# Patient Record
Sex: Male | Born: 1960 | Race: White | Hispanic: No | Marital: Married | Smoking: Former smoker
Health system: Southern US, Community
[De-identification: ages and names within clinical notes are randomized; demographics above are authoritative.]

## PROBLEM LIST (undated history)

## (undated) DIAGNOSIS — K760 Fatty (change of) liver, not elsewhere classified: Secondary | ICD-10-CM

## (undated) DIAGNOSIS — J449 Chronic obstructive pulmonary disease, unspecified: Secondary | ICD-10-CM

## (undated) DIAGNOSIS — IMO0001 Reserved for inherently not codable concepts without codable children: Secondary | ICD-10-CM

## (undated) DIAGNOSIS — E7800 Pure hypercholesterolemia, unspecified: Secondary | ICD-10-CM

## (undated) DIAGNOSIS — C3492 Malignant neoplasm of unspecified part of left bronchus or lung: Principal | ICD-10-CM

## (undated) DIAGNOSIS — G4730 Sleep apnea, unspecified: Secondary | ICD-10-CM

## (undated) DIAGNOSIS — IMO0002 Reserved for concepts with insufficient information to code with codable children: Secondary | ICD-10-CM

## (undated) DIAGNOSIS — F419 Anxiety disorder, unspecified: Secondary | ICD-10-CM

## (undated) DIAGNOSIS — E041 Nontoxic single thyroid nodule: Secondary | ICD-10-CM

## (undated) DIAGNOSIS — J439 Emphysema, unspecified: Secondary | ICD-10-CM

## (undated) DIAGNOSIS — F4310 Post-traumatic stress disorder, unspecified: Secondary | ICD-10-CM

## (undated) HISTORY — DX: Nontoxic single thyroid nodule: E04.1

## (undated) HISTORY — DX: Malignant neoplasm of unspecified part of left bronchus or lung: C34.92

## (undated) HISTORY — DX: Reserved for concepts with insufficient information to code with codable children: IMO0002

## (undated) HISTORY — DX: Pure hypercholesterolemia, unspecified: E78.00

## (undated) HISTORY — DX: Fatty (change of) liver, not elsewhere classified: K76.0

## (undated) HISTORY — PX: MOUTH SURGERY: SHX715

## (undated) HISTORY — DX: Emphysema, unspecified: J43.9

## (undated) HISTORY — DX: Reserved for inherently not codable concepts without codable children: IMO0001

---

## 2000-03-30 ENCOUNTER — Inpatient Hospital Stay (HOSPITAL_COMMUNITY): Admission: EM | Admit: 2000-03-30 | Discharge: 2000-04-14 | Payer: Self-pay

## 2000-03-30 ENCOUNTER — Encounter: Payer: Self-pay | Admitting: Emergency Medicine

## 2000-03-30 ENCOUNTER — Encounter: Payer: Self-pay | Admitting: General Surgery

## 2000-03-31 ENCOUNTER — Encounter: Payer: Self-pay | Admitting: General Surgery

## 2000-04-01 ENCOUNTER — Encounter: Payer: Self-pay | Admitting: General Surgery

## 2000-04-02 ENCOUNTER — Encounter: Payer: Self-pay | Admitting: General Surgery

## 2000-04-03 ENCOUNTER — Encounter: Payer: Self-pay | Admitting: General Surgery

## 2000-04-04 ENCOUNTER — Encounter: Payer: Self-pay | Admitting: General Surgery

## 2000-04-04 ENCOUNTER — Encounter: Payer: Self-pay | Admitting: Critical Care Medicine

## 2000-04-05 ENCOUNTER — Encounter: Payer: Self-pay | Admitting: General Surgery

## 2000-04-06 ENCOUNTER — Encounter: Payer: Self-pay | Admitting: General Surgery

## 2000-04-07 ENCOUNTER — Encounter: Payer: Self-pay | Admitting: General Surgery

## 2000-04-08 ENCOUNTER — Encounter: Payer: Self-pay | Admitting: General Surgery

## 2000-04-09 ENCOUNTER — Encounter: Payer: Self-pay | Admitting: General Surgery

## 2000-04-11 ENCOUNTER — Encounter: Payer: Self-pay | Admitting: General Surgery

## 2000-04-13 ENCOUNTER — Encounter: Payer: Self-pay | Admitting: General Surgery

## 2000-04-14 ENCOUNTER — Encounter: Payer: Self-pay | Admitting: General Surgery

## 2000-04-17 ENCOUNTER — Encounter: Admission: RE | Admit: 2000-04-17 | Discharge: 2000-04-17 | Payer: Self-pay | Admitting: General Surgery

## 2005-01-05 ENCOUNTER — Ambulatory Visit: Payer: Self-pay | Admitting: Pulmonary Disease

## 2006-04-17 ENCOUNTER — Ambulatory Visit: Payer: Self-pay | Admitting: Pulmonary Disease

## 2006-04-17 ENCOUNTER — Ambulatory Visit (HOSPITAL_COMMUNITY): Admission: RE | Admit: 2006-04-17 | Discharge: 2006-04-17 | Payer: Self-pay | Admitting: Pulmonary Disease

## 2006-04-17 LAB — CONVERTED CEMR LAB
Alkaline Phosphatase: 69 units/L (ref 39–117)
BUN: 11 mg/dL (ref 6–23)
Basophils Absolute: 0 10*3/uL (ref 0.0–0.1)
Bilirubin, Direct: 0.1 mg/dL (ref 0.0–0.3)
CO2: 31 meq/L (ref 19–32)
Chloride: 102 meq/L (ref 96–112)
Creatinine, Ser: 0.8 mg/dL (ref 0.4–1.5)
Eosinophils Absolute: 0.4 10*3/uL (ref 0.0–0.6)
Eosinophils Relative: 3.6 % (ref 0.0–5.0)
GFR calc non Af Amer: 111 mL/min
Glucose, Bld: 97 mg/dL (ref 70–99)
MCV: 89.8 fL (ref 78.0–100.0)
Monocytes Absolute: 1.1 10*3/uL — ABNORMAL HIGH (ref 0.2–0.7)
Neutro Abs: 5.8 10*3/uL (ref 1.4–7.7)
Platelets: 446 10*3/uL — ABNORMAL HIGH (ref 150–400)
RDW: 13.2 % (ref 11.5–14.6)
Sodium: 140 meq/L (ref 135–145)
Total Bilirubin: 0.6 mg/dL (ref 0.3–1.2)
Total Protein: 8.1 g/dL (ref 6.0–8.3)

## 2006-07-28 ENCOUNTER — Emergency Department (HOSPITAL_COMMUNITY): Admission: EM | Admit: 2006-07-28 | Discharge: 2006-07-29 | Payer: Self-pay | Admitting: Emergency Medicine

## 2006-07-30 ENCOUNTER — Ambulatory Visit: Payer: Self-pay | Admitting: Pulmonary Disease

## 2007-01-02 ENCOUNTER — Ambulatory Visit: Payer: Self-pay | Admitting: Pulmonary Disease

## 2007-01-02 LAB — CONVERTED CEMR LAB
Albumin: 4.1 g/dL (ref 3.5–5.2)
BUN: 9 mg/dL (ref 6–23)
Basophils Absolute: 0.1 10*3/uL (ref 0.0–0.1)
Calcium: 9.7 mg/dL (ref 8.4–10.5)
Chloride: 102 meq/L (ref 96–112)
Eosinophils Absolute: 0.3 10*3/uL (ref 0.0–0.6)
Glucose, Bld: 102 mg/dL — ABNORMAL HIGH (ref 70–99)
HCT: 41.1 % (ref 39.0–52.0)
MCHC: 34.5 g/dL (ref 30.0–36.0)
Monocytes Absolute: 0.8 10*3/uL — ABNORMAL HIGH (ref 0.2–0.7)
Potassium: 4 meq/L (ref 3.5–5.1)
Sed Rate: 35 mm/hr — ABNORMAL HIGH (ref 0–20)
Sodium: 142 meq/L (ref 135–145)
TSH: 3.56 microintl units/mL (ref 0.35–5.50)
Total Protein: 7.8 g/dL (ref 6.0–8.3)
WBC: 12.8 10*3/uL — ABNORMAL HIGH (ref 4.5–10.5)

## 2007-01-03 ENCOUNTER — Ambulatory Visit (HOSPITAL_COMMUNITY): Admission: RE | Admit: 2007-01-03 | Discharge: 2007-01-03 | Payer: Self-pay | Admitting: Pulmonary Disease

## 2007-01-11 ENCOUNTER — Telehealth: Payer: Self-pay | Admitting: Pulmonary Disease

## 2007-06-06 ENCOUNTER — Emergency Department (HOSPITAL_COMMUNITY): Admission: EM | Admit: 2007-06-06 | Discharge: 2007-06-06 | Payer: Self-pay | Admitting: Family Medicine

## 2007-06-12 ENCOUNTER — Emergency Department (HOSPITAL_COMMUNITY): Admission: EM | Admit: 2007-06-12 | Discharge: 2007-06-12 | Payer: Self-pay | Admitting: Emergency Medicine

## 2009-03-06 ENCOUNTER — Emergency Department (HOSPITAL_COMMUNITY): Admission: EM | Admit: 2009-03-06 | Discharge: 2009-03-06 | Payer: Self-pay | Admitting: Family Medicine

## 2009-05-20 ENCOUNTER — Emergency Department (HOSPITAL_COMMUNITY): Admission: EM | Admit: 2009-05-20 | Discharge: 2009-05-20 | Payer: Self-pay | Admitting: Family Medicine

## 2009-08-23 ENCOUNTER — Emergency Department (HOSPITAL_COMMUNITY): Admission: EM | Admit: 2009-08-23 | Discharge: 2009-08-23 | Payer: Self-pay | Admitting: Emergency Medicine
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
## 2010-10-03 ENCOUNTER — Telehealth: Payer: Self-pay | Admitting: Pulmonary Disease

## 2010-10-03 NOTE — Telephone Encounter (Signed)
Pt was last seen in 2008. Please advise Dr. Kriste Basque if you are accepting new patients. Thanks  Carver Fila, CMA

## 2010-10-04 NOTE — Telephone Encounter (Signed)
Spouse called back again- wants pt to be seen asap by dr nadel re: lump on left side of neck- also wants help to stop smoking. Call [phone]. Hazel Sams

## 2010-10-04 NOTE — Telephone Encounter (Signed)
Spoke with pt's wife.  States pt has a swollen area on the right side of his neck near his shoulder.  She is requesting SN see pt for this and to discuss smoking cessation options.  I explained to wife pt has not been seen in > than 3 yrs so he is considered a new pt.  We will have to send the message to Dr. Kriste Basque to see if he is willing to see pt bc he is not accepting new pts at this time.  She is ok with and states pt has not been in prior to this bc he is just know getting insurance. She is requesting SN refer another PCP IF he is unable to see pt.  Dr. Kriste Basque, pls advise if you are ok with seeing this pt.  Thanks!

## 2010-10-05 NOTE — Telephone Encounter (Signed)
lmtcb

## 2010-10-05 NOTE — Telephone Encounter (Signed)
Per SN---ok to see pt again.   Just schedule next aval. thanks

## 2010-10-06 NOTE — Telephone Encounter (Signed)
Aware of appt already.

## 2010-10-14 ENCOUNTER — Ambulatory Visit (INDEPENDENT_AMBULATORY_CARE_PROVIDER_SITE_OTHER): Payer: 59 | Admitting: Pulmonary Disease

## 2010-10-14 ENCOUNTER — Encounter: Payer: Self-pay | Admitting: Pulmonary Disease

## 2010-10-14 ENCOUNTER — Ambulatory Visit (INDEPENDENT_AMBULATORY_CARE_PROVIDER_SITE_OTHER)
Admission: RE | Admit: 2010-10-14 | Discharge: 2010-10-14 | Disposition: A | Discharge status: Home / Self Care | Payer: 59 | Source: Ambulatory Visit | Attending: Pulmonary Disease | Admitting: Pulmonary Disease

## 2010-10-14 ENCOUNTER — Other Ambulatory Visit (INDEPENDENT_AMBULATORY_CARE_PROVIDER_SITE_OTHER): Payer: 59

## 2010-10-14 VITALS — BP 128/82 | HR 77 | Temp 98.4°F | Ht 68.0 in | Wt 229.0 lb

## 2010-10-14 DIAGNOSIS — K7689 Other specified diseases of liver: Secondary | ICD-10-CM

## 2010-10-14 DIAGNOSIS — J449 Chronic obstructive pulmonary disease, unspecified: Secondary | ICD-10-CM

## 2010-10-14 DIAGNOSIS — F17210 Nicotine dependence, cigarettes, uncomplicated: Secondary | ICD-10-CM

## 2010-10-14 DIAGNOSIS — E669 Obesity, unspecified: Secondary | ICD-10-CM

## 2010-10-14 DIAGNOSIS — J4489 Other specified chronic obstructive pulmonary disease: Secondary | ICD-10-CM

## 2010-10-14 DIAGNOSIS — K760 Fatty (change of) liver, not elsewhere classified: Secondary | ICD-10-CM

## 2010-10-14 DIAGNOSIS — M1990 Unspecified osteoarthritis, unspecified site: Secondary | ICD-10-CM

## 2010-10-14 DIAGNOSIS — F17200 Nicotine dependence, unspecified, uncomplicated: Secondary | ICD-10-CM

## 2010-10-14 DIAGNOSIS — E785 Hyperlipidemia, unspecified: Secondary | ICD-10-CM

## 2010-10-14 LAB — BASIC METABOLIC PANEL
Calcium: 9 mg/dL (ref 8.4–10.5)
Chloride: 101 mEq/L (ref 96–112)
GFR: 97.32 mL/min (ref >60.00)
Potassium: 3.8 mEq/L (ref 3.5–5.1)
Sodium: 140 mEq/L (ref 135–145)

## 2010-10-14 LAB — CBC WITH DIFFERENTIAL/PLATELET
Basophils Relative: 1.4 % (ref 0.0–3.0)
Eosinophils Relative: 2.3 % (ref 0.0–5.0)
HCT: 44.9 % (ref 39.0–52.0)
Hemoglobin: 15.3 g/dL (ref 13.0–17.0)
Monocytes Absolute: 1.1 10*3/uL — ABNORMAL HIGH (ref 0.1–1.0)
Monocytes Relative: 11.4 % (ref 3.0–12.0)
RBC: 4.87 Mil/uL (ref 4.22–5.81)
RDW: 14.6 % (ref 11.5–14.6)
WBC: 10 10*3/uL (ref 4.5–10.5)

## 2010-10-14 LAB — HEPATIC FUNCTION PANEL
ALT: 35 U/L (ref 0–53)
Albumin: 4.4 g/dL (ref 3.5–5.2)
Total Protein: 7.9 g/dL (ref 6.0–8.3)

## 2010-10-14 MED ORDER — PREDNISONE 20 MG PO TABS: ORAL_TABLET | ORAL | Status: DC

## 2010-10-14 MED ORDER — TIOTROPIUM BROMIDE MONOHYDRATE 18 MCG IN CAPS: 18.0000 ug | ORAL_CAPSULE | Freq: Every day | RESPIRATORY_TRACT | Status: DC

## 2010-10-14 MED ORDER — BUDESONIDE-FORMOTEROL FUMARATE 160-4.5 MCG/ACT IN AERO: 2.0000 | INHALATION_SPRAY | Freq: Two times a day (BID) | RESPIRATORY_TRACT | Status: DC

## 2010-10-14 NOTE — Patient Instructions (Signed)
We decided to start treatment for your COPD with:    1) Prednisone> take one tab in Am for 10d, then decrease to 1/2 tab daily thereafter...    2) SYMBICORT> 2 inhalations twice daily as demonstrated; take this regularly...    3) SPIRIVA> inhale the contents of one capsule via the handihaler daily.Marland KitchenMarland Kitchen    4) MUCINEX OTC> 600mg  tabs, take 2 tabs twice daily w/ lots of fluids...  We also wrote a prescription for CHANTIX to aide in smoking cessation... Call the quit line at 1-800 QUIT NOW for extra help...  Today we did your follow upCXR & blood work...    Please call the PHONE TREE in a few days for your results...    Dial [id number] & when prompted enter your patient number followed by the # symbol...    Your patient number is:  [id number]#  Let's plan a follow up visit in about a month...    Call for any problems.Marland KitchenMarland Kitchen

## 2010-10-16 ENCOUNTER — Encounter: Payer: Self-pay | Admitting: Pulmonary Disease
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
## 2010-11-15 ENCOUNTER — Ambulatory Visit (INDEPENDENT_AMBULATORY_CARE_PROVIDER_SITE_OTHER): Payer: 59 | Admitting: Pulmonary Disease

## 2010-11-15 ENCOUNTER — Encounter: Payer: Self-pay | Admitting: Pulmonary Disease

## 2010-11-15 VITALS — BP 120/86 | HR 96 | Temp 97.0°F | Ht 68.0 in | Wt 247.2 lb

## 2010-11-15 DIAGNOSIS — M1990 Unspecified osteoarthritis, unspecified site: Secondary | ICD-10-CM

## 2010-11-15 DIAGNOSIS — F17200 Nicotine dependence, unspecified, uncomplicated: Secondary | ICD-10-CM

## 2010-11-15 DIAGNOSIS — K7689 Other specified diseases of liver: Secondary | ICD-10-CM

## 2010-11-15 DIAGNOSIS — K760 Fatty (change of) liver, not elsewhere classified: Secondary | ICD-10-CM

## 2010-11-15 DIAGNOSIS — F17210 Nicotine dependence, cigarettes, uncomplicated: Secondary | ICD-10-CM

## 2010-11-15 DIAGNOSIS — J449 Chronic obstructive pulmonary disease, unspecified: Secondary | ICD-10-CM

## 2010-11-15 DIAGNOSIS — E785 Hyperlipidemia, unspecified: Secondary | ICD-10-CM

## 2010-11-15 DIAGNOSIS — E669 Obesity, unspecified: Secondary | ICD-10-CM

## 2010-11-15 DIAGNOSIS — J4489 Other specified chronic obstructive pulmonary disease: Secondary | ICD-10-CM

## 2010-11-15 MED ORDER — FLUTICASONE-SALMETEROL 230-21 MCG/ACT IN AERO: 2.0000 | INHALATION_SPRAY | Freq: Two times a day (BID) | RESPIRATORY_TRACT | Status: DC

## 2010-11-15 NOTE — Patient Instructions (Signed)
Today we updated your med list in EPIC...    Decrease the Prednisone to 1/2 tab every other day til gone...    Remember to get a copy of your insurance company's Drug Formulary & keep this w/ you at all your doctor visits...    We will try to switch the Symbicort to ADVAIR HFA (to see if this is covered better for you)> 2 sprays twice daily...    Continue the SPIRIVA... Continue the [facility] & Fluids...  Congrats on the smoking cessation!!!  Watch your caloric intake (ie- get on a diet & exercise program) & work on weight reduction...  Let's plan a follow up visit in 3 months.Marland KitchenMarland Kitchen

## 2010-11-20 ENCOUNTER — Encounter: Payer: Self-pay | Admitting: Pulmonary Disease
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
## 2010-12-08 LAB — DIFFERENTIAL
Basophils Absolute: 0.3 — ABNORMAL HIGH
Basophils Relative: 3 — ABNORMAL HIGH
Eosinophils Absolute: 0.3
Lymphs Abs: 2.6

## 2010-12-08 LAB — COMPREHENSIVE METABOLIC PANEL
ALT: 28
AST: 23
Albumin: 3.6
Calcium: 9.6
Glucose, Bld: 109 — ABNORMAL HIGH
Potassium: 3.9
Total Bilirubin: 0.4

## 2010-12-08 LAB — RAPID URINE DRUG SCREEN, HOSP PERFORMED
Barbiturates: NOT DETECTED
Benzodiazepines: POSITIVE — AB
Cocaine: NOT DETECTED
Opiates: NOT DETECTED
Tetrahydrocannabinol: NOT DETECTED

## 2010-12-08 LAB — URINALYSIS, ROUTINE W REFLEX MICROSCOPIC
Bilirubin Urine: NEGATIVE
Glucose, UA: NEGATIVE
Ketones, ur: NEGATIVE
Nitrite: NEGATIVE
Urobilinogen, UA: 0.2
pH: 6.5

## 2010-12-08 LAB — CBC
Hemoglobin: 14.1
MCHC: 35.1
MCV: 90.6
Platelets: 422 — ABNORMAL HIGH
RDW: 13.5

## 2010-12-26 ENCOUNTER — Encounter: Payer: Self-pay | Admitting: Adult Health

## 2010-12-26 ENCOUNTER — Ambulatory Visit (INDEPENDENT_AMBULATORY_CARE_PROVIDER_SITE_OTHER): Payer: 59 | Admitting: Adult Health

## 2010-12-26 VITALS — BP 120/78 | HR 97 | Temp 97.4°F | Ht 68.0 in | Wt 251.2 lb

## 2010-12-26 DIAGNOSIS — R059 Cough, unspecified: Secondary | ICD-10-CM

## 2010-12-26 DIAGNOSIS — R05 Cough: Secondary | ICD-10-CM

## 2010-12-26 MED ORDER — AMOXICILLIN-POT CLAVULANATE 875-125 MG PO TABS: 1.0000 | ORAL_TABLET | Freq: Two times a day (BID) | ORAL | Status: AC

## 2010-12-26 MED ORDER — ALBUTEROL SULFATE (2.5 MG/3ML) 0.083% IN NEBU
2.5000 mg | INHALATION_SOLUTION | Freq: Once | RESPIRATORY_TRACT | Status: AC
  Administered 2010-12-26: 2.5 mg via RESPIRATORY_TRACT

## 2010-12-26 MED ORDER — PREDNISONE 10 MG PO TABS: ORAL_TABLET | ORAL | Status: AC
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
## 2010-12-26 NOTE — Assessment & Plan Note (Signed)
Flare   Plan;  Augmentin 875mg  Twice daily  For 7 days with food  Mucinex DM Twice daily  As needed  Cough/congestion  Prednisone taper over next week.  Fluids and rest  Please contact office for sooner follow up if symptoms do not improve or worsen or seek emergency care

## 2010-12-26 NOTE — Patient Instructions (Signed)
Augmentin 875mg  Twice daily  For 7 days with food  Mucinex DM Twice daily  As needed  Cough/congestion  Prednisone taper over next week.  Fluids and rest  Please contact office for sooner follow up if symptoms do not improve or worsen or seek emergency care

## 2011-02-12 ENCOUNTER — Emergency Department (HOSPITAL_COMMUNITY): Payer: 59

## 2011-02-12 ENCOUNTER — Emergency Department (HOSPITAL_COMMUNITY)
Admission: EM | Admit: 2011-02-12 | Discharge: 2011-02-12 | Disposition: A | Discharge status: Home / Self Care | Payer: 59 | Attending: Emergency Medicine | Admitting: Emergency Medicine

## 2011-02-12 ENCOUNTER — Encounter (HOSPITAL_COMMUNITY): Payer: Self-pay | Admitting: Emergency Medicine

## 2011-02-12 DIAGNOSIS — R079 Chest pain, unspecified: Secondary | ICD-10-CM | POA: Insufficient documentation

## 2011-02-12 DIAGNOSIS — R404 Transient alteration of awareness: Secondary | ICD-10-CM | POA: Insufficient documentation

## 2011-02-12 DIAGNOSIS — R141 Gas pain: Secondary | ICD-10-CM | POA: Insufficient documentation

## 2011-02-12 DIAGNOSIS — S60219A Contusion of unspecified wrist, initial encounter: Secondary | ICD-10-CM | POA: Insufficient documentation

## 2011-02-12 DIAGNOSIS — M7989 Other specified soft tissue disorders: Secondary | ICD-10-CM | POA: Insufficient documentation

## 2011-02-12 DIAGNOSIS — R142 Eructation: Secondary | ICD-10-CM | POA: Insufficient documentation

## 2011-02-12 DIAGNOSIS — Y92410 Unspecified street and highway as the place of occurrence of the external cause: Secondary | ICD-10-CM | POA: Insufficient documentation

## 2011-02-12 DIAGNOSIS — R109 Unspecified abdominal pain: Secondary | ICD-10-CM | POA: Insufficient documentation

## 2011-02-12 DIAGNOSIS — R10819 Abdominal tenderness, unspecified site: Secondary | ICD-10-CM | POA: Insufficient documentation

## 2011-02-12 DIAGNOSIS — M79609 Pain in unspecified limb: Secondary | ICD-10-CM | POA: Insufficient documentation

## 2011-02-12 DIAGNOSIS — R51 Headache: Secondary | ICD-10-CM | POA: Insufficient documentation

## 2011-02-12 DIAGNOSIS — IMO0002 Reserved for concepts with insufficient information to code with codable children: Secondary | ICD-10-CM | POA: Insufficient documentation

## 2011-02-12 DIAGNOSIS — R143 Flatulence: Secondary | ICD-10-CM | POA: Insufficient documentation

## 2011-02-12 HISTORY — DX: Chronic obstructive pulmonary disease, unspecified: J44.9

## 2011-02-12 LAB — BASIC METABOLIC PANEL
Chloride: 98 mEq/L (ref 96–112)
Creatinine, Ser: 0.85 mg/dL (ref 0.50–1.35)
GFR calc Af Amer: >90 mL/min (ref >90)
GFR calc non Af Amer: >90 mL/min (ref >90)
Potassium: 4.2 mEq/L (ref 3.5–5.1)

## 2011-02-12 LAB — CBC
MCHC: 33.6 g/dL (ref 30.0–36.0)
Platelets: 372 10*3/uL (ref 150–400)
RDW: 14.3 % (ref 11.5–15.5)
WBC: 11.8 10*3/uL — ABNORMAL HIGH (ref 4.0–10.5)

## 2011-02-12 LAB — PROTIME-INR: Prothrombin Time: 11.6 seconds (ref 11.6–15.2)

## 2011-02-12 MED ORDER — IOHEXOL 300 MG/ML  SOLN
100.0000 mL | Freq: Once | INTRAMUSCULAR | Status: AC | PRN
  Administered 2011-02-12: 100 mL via INTRAVENOUS

## 2011-02-12 MED ORDER — HYDROCODONE-ACETAMINOPHEN 5-500 MG PO TABS: 1.0000 | ORAL_TABLET | Freq: Four times a day (QID) | ORAL | Status: AC | PRN

## 2011-02-12 MED ORDER — HYDROMORPHONE HCL PF 1 MG/ML IJ SOLN
1.0000 mg | Freq: Once | INTRAMUSCULAR | Status: AC
  Administered 2011-02-12: 1 mg via INTRAVENOUS
  Filled 2011-02-12: qty 1

## 2011-02-12 NOTE — ED Notes (Signed)
Pt is in xray at this time 
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
## 2011-02-20 ENCOUNTER — Ambulatory Visit: Payer: 59 | Admitting: Pulmonary Disease

## 2011-02-24 ENCOUNTER — Other Ambulatory Visit: Payer: Self-pay | Admitting: Pulmonary Disease

## 2011-02-24 ENCOUNTER — Other Ambulatory Visit (INDEPENDENT_AMBULATORY_CARE_PROVIDER_SITE_OTHER): Payer: 59

## 2011-02-24 ENCOUNTER — Ambulatory Visit (INDEPENDENT_AMBULATORY_CARE_PROVIDER_SITE_OTHER): Payer: 59 | Admitting: Pulmonary Disease

## 2011-02-24 ENCOUNTER — Encounter: Payer: Self-pay | Admitting: Pulmonary Disease

## 2011-02-24 DIAGNOSIS — G4733 Obstructive sleep apnea (adult) (pediatric): Secondary | ICD-10-CM

## 2011-02-24 DIAGNOSIS — M1990 Unspecified osteoarthritis, unspecified site: Secondary | ICD-10-CM

## 2011-02-24 DIAGNOSIS — K760 Fatty (change of) liver, not elsewhere classified: Secondary | ICD-10-CM

## 2011-02-24 DIAGNOSIS — K7689 Other specified diseases of liver: Secondary | ICD-10-CM

## 2011-02-24 DIAGNOSIS — E785 Hyperlipidemia, unspecified: Secondary | ICD-10-CM

## 2011-02-24 DIAGNOSIS — F17210 Nicotine dependence, cigarettes, uncomplicated: Secondary | ICD-10-CM

## 2011-02-24 DIAGNOSIS — E669 Obesity, unspecified: Secondary | ICD-10-CM

## 2011-02-24 DIAGNOSIS — F17200 Nicotine dependence, unspecified, uncomplicated: Secondary | ICD-10-CM

## 2011-02-24 DIAGNOSIS — J449 Chronic obstructive pulmonary disease, unspecified: Secondary | ICD-10-CM

## 2011-02-24 DIAGNOSIS — Z23 Encounter for immunization: Secondary | ICD-10-CM

## 2011-02-24 DIAGNOSIS — J4489 Other specified chronic obstructive pulmonary disease: Secondary | ICD-10-CM

## 2011-02-24 HISTORY — DX: Obstructive sleep apnea (adult) (pediatric): G47.33

## 2011-02-24 LAB — HEPATIC FUNCTION PANEL
ALT: 36 U/L (ref 0–53)
AST: 27 U/L (ref 0–37)
Alkaline Phosphatase: 74 U/L (ref 39–117)
Total Bilirubin: 0.5 mg/dL (ref 0.3–1.2)

## 2011-02-24 LAB — LIPID PANEL
Cholesterol: 203 mg/dL — ABNORMAL HIGH (ref 0–200)
Triglycerides: 277 mg/dL — ABNORMAL HIGH (ref 0.0–149.0)

## 2011-02-24 LAB — LDL CHOLESTEROL, DIRECT: Direct LDL: 125.3 mg/dL

## 2011-02-24 MED ORDER — BUDESONIDE-FORMOTEROL FUMARATE 160-4.5 MCG/ACT IN AERO: 2.0000 | INHALATION_SPRAY | Freq: Two times a day (BID) | RESPIRATORY_TRACT | Status: DC

## 2011-02-24 MED ORDER — TIOTROPIUM BROMIDE MONOHYDRATE 18 MCG IN CAPS: 18.0000 ug | ORAL_CAPSULE | Freq: Every day | RESPIRATORY_TRACT | Status: DC
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
## 2011-02-25 ENCOUNTER — Encounter: Payer: Self-pay | Admitting: Pulmonary Disease

## 2011-03-08 ENCOUNTER — Ambulatory Visit (HOSPITAL_BASED_OUTPATIENT_CLINIC_OR_DEPARTMENT_OTHER): Payer: 59 | Attending: Pulmonary Disease

## 2011-03-08 VITALS — Ht 68.0 in | Wt 252.0 lb

## 2011-03-08 DIAGNOSIS — Z9989 Dependence on other enabling machines and devices: Secondary | ICD-10-CM

## 2011-03-08 DIAGNOSIS — G4733 Obstructive sleep apnea (adult) (pediatric): Secondary | ICD-10-CM

## 2011-03-21 DIAGNOSIS — G4733 Obstructive sleep apnea (adult) (pediatric): Secondary | ICD-10-CM

## 2011-03-22 NOTE — Procedures (Signed)
NAMETALHA, ISER NO.:  [id number]  MEDICAL RECORD NO.:  [id number]          PATIENT TYPE:  OUT  LOCATION:  SLEEP CENTER                 FACILITY[facility]  [facility]  PHYSICIAN:  Barbaraann Share, MD,FCCPDATE OF BIRTH:  10/23/1960  DATE OF STUDY:  03/08/2011                           NOCTURNAL POLYSOMNOGRAM  REFERRING PHYSICIAN:  Lonzo Cloud. Kriste Basque, MD  INDICATION FOR STUDY:  Hypersomnia with sleep apnea.  EPWORTH SLEEPINESS SCORE:  13.  MEDICATIONS:  SLEEP ARCHITECTURE:  The patient had a total sleep time of 347 minutes with very little slow-wave sleep and adequate quantity of REM.  Sleep onset latency was normal at 13 minutes and REM onset was prolonged at 134 minutes.  Sleep efficiency was 72% during the diagnostic portion of the study and 94% during the titration portion of the study.  RESPIRATORY DATA:  The patient underwent standard NPSG for possible obstructive sleep apnea.  However, during the study, he met emergency split night criteria due to severe oxygen desaturation.  He was found to have 202 obstructive events in the first 132 minutes of sleep.  This gave him an apnea-hypopnea index of 92 events per hour during the diagnostic portion of the study.  The events occurred in all body positions and there was loud snoring noted throughout.  By protocol, the patient was fitted with a medium ResMed Quattro FX full face mask, and CPAP titration was initiated.  At a CPAP pressure of 13 cm, he was found to have good control of his obstructive events and snoring; however, he continued to have oxygen desaturation.  OXYGEN DATA:  There was O2 desaturation as low as 56% with the patient's obstructive events.  CARDIAC DATA:  No clinically significant arrhythmias were noted.  MOVEMENT-PARASOMNIA:  The patient had no significant leg jerks or other abnormalities noted.  IMPRESSIONS-RECOMMENDATIONS:  Very severe obstructive sleep apnea/hypopnea syndrome with an AHI of 92  events per hour during the diagnostic portion of the study, and O2 desaturation as low as 56%.  The patient was then fitted with a medium ResMed Quattro FX full face mask, and titrated to an optimal pressure of 13 cm.  The patient will need to be started on CPAP, and will need overnight oximetry on his therapeutic CPAP pressure in order to determine further supplemental oxygen needs.  He should also be encouraged to work aggressively on weight loss.     Barbaraann Share, MD,FCCP Diplomate, American Board of Sleep Medicine Electronically Signed    [facility]  D:  03/22/2011 08:10:39  T:  03/22/2011 08:38:30  Job:  [id number]

## 2011-03-28 ENCOUNTER — Ambulatory Visit (INDEPENDENT_AMBULATORY_CARE_PROVIDER_SITE_OTHER): Payer: 59 | Admitting: Pulmonary Disease

## 2011-03-28 ENCOUNTER — Encounter: Payer: Self-pay | Admitting: Pulmonary Disease

## 2011-03-28 VITALS — BP 110/80 | HR 86 | Temp 98.2°F | Ht 68.0 in | Wt 242.2 lb

## 2011-03-28 DIAGNOSIS — G4733 Obstructive sleep apnea (adult) (pediatric): Secondary | ICD-10-CM

## 2011-03-28 NOTE — Progress Notes (Signed)
Subjective:    Patient ID: Vincent Sutton, male    DOB: November 30, 1960, 51 y.o.   MRN: [id number]  HPI The patient is a 51 year old male who I've been asked to see for management of obstructive sleep apnea.  He recently underwent a sleep study, where he was found to have very severe sleep apnea.  The patient had an AHI of 92 events per hour, with very severe oxygen desaturation.  He was started on CPAP, and titrated up to an optimal pressure of 13-14 cm of water.  The patient is morbidly obese, and has been noted to have loud snoring.  His wife has also commented on an abnormal breathing pattern during sleep, and has videotaped him.  The patient has frequent awakenings at night, and is not rested in the mornings upon arising.  He notes definite sleep pressure with periods of inactivity, and will fall asleep easily watching television in the evening.  He also has noticed sleep pressure with driving at times.  The patient states that his weight is up 25 pounds over the last 2 years, and his Epworth sleepiness score was elevated at the time of his sleep study.  Sleep Questionnaire: What time do you typically go to bed?( Between what hours) 10pm - 5am How long does it take you to fall asleep? 10 mins How many times during the night do you wake up? What time do you get out of bed to start your day? 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 (In pounds) 25 lb (11.34 kg) Have you ever had a sleep study before? Yes If yes, location of study? [facility] If yes, date of study? 03/08/2011 Do you currently use CPAP? No Do you wear oxygen at any time? No O2 Flow Rate (L/min) 2 L/min    Review of Systems  Constitutional: Negative for fever and unexpected weight change.  HENT: Negative for ear pain, nosebleeds, congestion, sore throat, rhinorrhea, sneezing, trouble swallowing, dental problem, postnasal drip and sinus pressure.     Eyes: Negative for redness and itching.  Respiratory: Negative for cough, chest tightness, shortness of breath and wheezing.   Cardiovascular: Negative for palpitations and leg swelling.  Gastrointestinal: Negative for nausea and vomiting.  Genitourinary: Negative for dysuria.  Musculoskeletal: Negative for joint swelling.  Skin: Negative for rash.  Neurological: Negative for headaches.  Hematological: Does not bruise/bleed easily.  Psychiatric/Behavioral: Negative for dysphoric mood. The patient is not nervous/anxious.        Objective:   Physical Exam Constitutional:  Morbidly obese, no acute distress  HENT:  Nares patent without discharge, but deviated septum to right with narrowing.  Oropharynx without exudate, palate and uvula are very thick and elongated, very little space posteriorly  Eyes:  Perrla, eomi, no scleral icterus  Neck:  No JVD, no TMG  Cardiovascular:  Normal rate, regular rhythm, no rubs or gallops.  No murmurs        Intact distal pulses  Pulmonary :  Normal breath sounds, no stridor or respiratory distress   No wheezing, but ++rhonchi  Abdominal:  Soft, nondistended, bowel sounds present.  No tenderness noted.   Musculoskeletal:  No lower extremity edema noted.  Lymph Nodes:  No cervical lymphadenopathy noted  Skin:  No cyanosis noted  Neurologic: appears sleepy, appropriate, moves all 4 extremities without obvious deficit.         Assessment & Plan:

## 2011-03-28 NOTE — Assessment & Plan Note (Signed)
The patient has been diagnosed with very severe obstructive sleep apnea by his recent sleep study, and is clearly very symptomatic at night and also during the day.  I have also reviewed its impact to his cardiovascular health.  At this point, he will need to be started on CPAP, and I have encouraged him to work aggressively on weight loss.  The patient is to call me if he has any issues with CPAP tolerance.

## 2011-03-28 NOTE — Patient Instructions (Signed)
Will start on cpap.  Please call if any tolerance issues Work on weight loss followup with me in 5-6 weeks.

## 2011-05-02 ENCOUNTER — Ambulatory Visit (INDEPENDENT_AMBULATORY_CARE_PROVIDER_SITE_OTHER): Payer: 59 | Admitting: Pulmonary Disease

## 2011-05-02 ENCOUNTER — Encounter: Payer: Self-pay | Admitting: Pulmonary Disease

## 2011-05-02 VITALS — BP 132/82 | HR 74 | Temp 97.7°F | Ht 68.0 in | Wt 242.2 lb

## 2011-05-02 DIAGNOSIS — G4733 Obstructive sleep apnea (adult) (pediatric): Secondary | ICD-10-CM

## 2011-05-02 NOTE — Progress Notes (Signed)
  Subjective:    Patient ID: Vincent Sutton, male    DOB: August 20, 1960, 51 y.o.   MRN: [id number]  HPI The patient comes in today for followup of his known obstructive sleep apnea.  He has been started on CPAP, and has been doing very well with the device.  He has seen a very significant improvement in his sleep and daytime alertness, and is having no issues with his mask fit or pressure.  He is not quite to his optimal CPAP level, and we'll have to get this increased.   Review of Systems  Constitutional: Negative for fever and unexpected weight change.  HENT: Positive for congestion, rhinorrhea, sneezing and postnasal drip. Negative for ear pain, nosebleeds, sore throat, trouble swallowing, dental problem and sinus pressure.   Eyes: Negative for redness and itching.  Respiratory: Negative for cough, chest tightness, shortness of breath and wheezing.   Cardiovascular: Negative for palpitations and leg swelling.  Gastrointestinal: Negative for nausea and vomiting.  Genitourinary: Negative for dysuria.  Musculoskeletal: Negative for joint swelling.  Skin: Negative for rash.  Neurological: Negative for headaches.  Hematological: Does not bruise/bleed easily.  Psychiatric/Behavioral: Negative for dysphoric mood. The patient is not nervous/anxious.        Objective:   Physical Exam Overweight male in no acute distress No skin breakdown or pressure necrosis from the CPAP mask Lower extremities without significant edema, no cyanosis Alert and oriented, moves all 4 extremities.       Assessment & Plan:

## 2011-05-02 NOTE — Assessment & Plan Note (Signed)
The patient comes in today for followup after starting on CPAP, and has seen a tremendous difference in his sleep and daytime alertness.  He is very pleased with his response to therapy so far.  At this point, we need to increase his CPAP pressure to his therapeutic level, and I have encouraged him to work aggressively on weight loss.  If he is doing well, I will see him back in 6 months.

## 2011-05-02 NOTE — Patient Instructions (Signed)
Will send an order to your equipment company to increase your cpap pressure to 13cm. Work on [employer] loss Can take zyrtec 10mg  each am for your allergy symptoms during the day. If doing well, followup with me in 6mos.  Call if having issues with cpap.

## 2011-05-15 ENCOUNTER — Telehealth: Payer: Self-pay | Admitting: Pulmonary Disease

## 2011-05-15 DIAGNOSIS — G4733 Obstructive sleep apnea (adult) (pediatric): Secondary | ICD-10-CM

## 2011-05-15 NOTE — Telephone Encounter (Signed)
I have sent order [facility], please advise thanks

## 2011-05-15 NOTE — Telephone Encounter (Signed)
Called and spoke with patient and advised patient that order has been faxed to [facility] for them to provide a full face mask. Pt stated that he wanted a full face the last time [employer] provided him with a mask and pt stated that [employer] didn't have one in stock. Advised patient to call me back if [facility] was not able to provide full face mask. Patient was advised that if [employer] couldn't that we could send him to the sleep lab for a mask fitting.  Referral has been faxed and patient is aware of the above.

## 2011-05-15 NOTE — Telephone Encounter (Signed)
I spoke with pt and he states since his cpap pressure has been turned up he can't keep seal on his mask. Pt states the nasal mask does not work right since he had broke his nose before. Pt states he can't get the mask that goes over his mouth and nose until may. He is requesting further recs from [facility]. Please advise thanks

## 2011-05-15 NOTE — Telephone Encounter (Signed)
Pcc, see if we can help this guy.  If he needs a new mask, he needs a new mask.  Sounds like he needs full face.  See if dme can work with him, and if not, ?refer to sleep center for fitting and give him a mask? thanks

## 2011-05-15 NOTE — Telephone Encounter (Signed)
Please send DME referral for [employer] to provide full face mask.

## 2011-05-26 ENCOUNTER — Ambulatory Visit (INDEPENDENT_AMBULATORY_CARE_PROVIDER_SITE_OTHER): Payer: 59 | Admitting: Pulmonary Disease

## 2011-05-26 ENCOUNTER — Encounter: Payer: Self-pay | Admitting: Pulmonary Disease

## 2011-05-26 VITALS — BP 136/82 | HR 71 | Temp 97.1°F | Ht 68.0 in | Wt 239.2 lb

## 2011-05-26 DIAGNOSIS — F17210 Nicotine dependence, cigarettes, uncomplicated: Secondary | ICD-10-CM

## 2011-05-26 DIAGNOSIS — F17200 Nicotine dependence, unspecified, uncomplicated: Secondary | ICD-10-CM

## 2011-05-26 DIAGNOSIS — K760 Fatty (change of) liver, not elsewhere classified: Secondary | ICD-10-CM

## 2011-05-26 DIAGNOSIS — E785 Hyperlipidemia, unspecified: Secondary | ICD-10-CM

## 2011-05-26 DIAGNOSIS — K7689 Other specified diseases of liver: Secondary | ICD-10-CM

## 2011-05-26 DIAGNOSIS — J4489 Other specified chronic obstructive pulmonary disease: Secondary | ICD-10-CM

## 2011-05-26 DIAGNOSIS — M1990 Unspecified osteoarthritis, unspecified site: Secondary | ICD-10-CM

## 2011-05-26 DIAGNOSIS — E669 Obesity, unspecified: Secondary | ICD-10-CM

## 2011-05-26 DIAGNOSIS — J449 Chronic obstructive pulmonary disease, unspecified: Secondary | ICD-10-CM

## 2011-05-26 DIAGNOSIS — G4733 Obstructive sleep apnea (adult) (pediatric): Secondary | ICD-10-CM

## 2011-05-26 MED ORDER — TADALAFIL 20 MG PO TABS: ORAL_TABLET | ORAL | Status: DC
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
## 2011-05-26 NOTE — Patient Instructions (Signed)
Today we updated your med list in our EPIC system...    Continue your current medications the same...    We wrote a new prescription for the Cialis as we discussed...  Keep up the great job w/ diet, exercise & weight reduction!!!  Congrats on the smoking improvement down to 2 cig/d...    Keep on going until you've quit completely!!!  Call for any problems...  Let's plan a follow up visit in 6 months or so & we will check your FASTING blood work at that time.Marland KitchenMarland Kitchen

## 2011-06-20 ENCOUNTER — Telehealth: Payer: Self-pay | Admitting: Pulmonary Disease

## 2011-06-20 MED ORDER — CICLESONIDE 160 MCG/ACT IN AERS: 2.0000 | INHALATION_SPRAY | Freq: Two times a day (BID) | RESPIRATORY_TRACT | Status: DC

## 2011-06-20 NOTE — Telephone Encounter (Signed)
Per SN---ok to change to alvesco 160  2 puffs bid.  thanks

## 2011-06-20 NOTE — Telephone Encounter (Signed)
New rx has been sent to the pharmacy and pt is aware of the change. Nothing further was needed

## 2011-06-20 NOTE — Telephone Encounter (Signed)
Called pharmacy help desk at [phone]. Was on hold for over 10 minutes. We will need to call for PA later. Member # [id number].  Pharmacy did list covered alternatives and they are Alvesco and Asmanex. Please advise if you would like to change to one of the covered alternatives or have the nurse call for PA on the Symbicort.[name]

## 2011-11-03 ENCOUNTER — Ambulatory Visit (INDEPENDENT_AMBULATORY_CARE_PROVIDER_SITE_OTHER): Payer: 59 | Admitting: Pulmonary Disease

## 2011-11-03 ENCOUNTER — Encounter: Payer: Self-pay | Admitting: Pulmonary Disease

## 2011-11-03 VITALS — BP 132/88 | HR 84 | Temp 98.5°F | Ht 68.0 in | Wt 225.6 lb

## 2011-11-03 DIAGNOSIS — G4733 Obstructive sleep apnea (adult) (pediatric): Secondary | ICD-10-CM

## 2011-11-03 DIAGNOSIS — Z23 Encounter for immunization: Secondary | ICD-10-CM

## 2011-11-03 MED ORDER — MUPIROCIN CALCIUM 2 % EX CREA: TOPICAL_CREAM | Freq: Three times a day (TID) | CUTANEOUS | Status: AC

## 2011-11-03 NOTE — Progress Notes (Signed)
  Subjective:    Patient ID: Vincent Sutton, male    DOB: [birth year], 51 y.o.   MRN: [id number]  HPI The patient in today for followup of his obstructive sleep apnea.  He is having issues with his CPAP mask, and this leads to poor tolerance at times.  Other times, he is able to work consistently, and sees a big difference in how he sleeps and feels that next day.  He has been placed on his optimal pressure, and is having no issues with tolerance.  He has tried to work with his Scientist, research (physical sciences), but he is having no success in getting an adequately fitting mask.   Review of Systems  Constitutional: Negative for fever and unexpected weight change.  HENT: Negative for ear pain, nosebleeds, congestion, sore throat, rhinorrhea, sneezing, trouble swallowing, dental problem, postnasal drip and sinus pressure.   Eyes: Negative for redness and itching.  Respiratory: Negative for cough, chest tightness, shortness of breath and wheezing.   Cardiovascular: Negative for palpitations and leg swelling.  Gastrointestinal: Negative for nausea and vomiting.  Genitourinary: Negative for dysuria.  Musculoskeletal: Negative for joint swelling.  Skin: Negative for rash.  Neurological: Negative for headaches.  Hematological: Does not bruise/bleed easily.  Psychiatric/Behavioral: Negative for dysphoric mood. The patient is not nervous/anxious.        Objective:   Physical Exam Overweight male in no acute distress No skin breakdown or pressure necrosis from the CPAP mask Lower extremities with minimal edema, no cyanosis Alert and oriented, does not appear to be sleepy, moves all 4 extremities.       Assessment & Plan:

## 2011-11-03 NOTE — Patient Instructions (Addendum)
Continue with cpap, and keep up with supplies Will refer you to the sleep center for mask fitting. Keep working on weight loss followup with me in one year if doing well.

## 2011-11-03 NOTE — Assessment & Plan Note (Signed)
The patient is wearing CPAP consistently, but can only wear for a short period of time on some nights because of mask issues.  I have explained to him that mask that is everything, and that he will not be successful with CPAP if we cannot improve this.  I would like to send him to the sleep center for a formal fitting, and he is agreeable.  I also encouraged him to work aggressively on weight loss.

## 2011-11-06 ENCOUNTER — Telehealth: Payer: Self-pay | Admitting: Pulmonary Disease

## 2011-11-06 DIAGNOSIS — G4733 Obstructive sleep apnea (adult) (pediatric): Secondary | ICD-10-CM

## 2011-11-06 NOTE — Telephone Encounter (Signed)
Order was sent to [facility] 

## 2011-11-30 ENCOUNTER — Other Ambulatory Visit: Payer: Self-pay | Admitting: Pulmonary Disease

## 2011-12-01 ENCOUNTER — Encounter: Payer: Self-pay | Admitting: Pulmonary Disease

## 2011-12-01 ENCOUNTER — Ambulatory Visit (INDEPENDENT_AMBULATORY_CARE_PROVIDER_SITE_OTHER): Payer: 59 | Admitting: Pulmonary Disease

## 2011-12-01 VITALS — BP 122/88 | HR 69 | Temp 97.0°F | Ht 68.0 in | Wt 223.2 lb

## 2011-12-01 DIAGNOSIS — E785 Hyperlipidemia, unspecified: Secondary | ICD-10-CM

## 2011-12-01 DIAGNOSIS — K7689 Other specified diseases of liver: Secondary | ICD-10-CM

## 2011-12-01 DIAGNOSIS — F17210 Nicotine dependence, cigarettes, uncomplicated: Secondary | ICD-10-CM

## 2011-12-01 DIAGNOSIS — E669 Obesity, unspecified: Secondary | ICD-10-CM

## 2011-12-01 DIAGNOSIS — F17200 Nicotine dependence, unspecified, uncomplicated: Secondary | ICD-10-CM

## 2011-12-01 DIAGNOSIS — J449 Chronic obstructive pulmonary disease, unspecified: Secondary | ICD-10-CM

## 2011-12-01 DIAGNOSIS — J4489 Other specified chronic obstructive pulmonary disease: Secondary | ICD-10-CM

## 2011-12-01 DIAGNOSIS — K760 Fatty (change of) liver, not elsewhere classified: Secondary | ICD-10-CM

## 2011-12-01 DIAGNOSIS — G4733 Obstructive sleep apnea (adult) (pediatric): Secondary | ICD-10-CM

## 2011-12-01 DIAGNOSIS — M1990 Unspecified osteoarthritis, unspecified site: Secondary | ICD-10-CM

## 2011-12-01 MED ORDER — MOMETASONE FUROATE 220 MCG/INH IN AEPB: 2.0000 | INHALATION_SPRAY | Freq: Every day | RESPIRATORY_TRACT | Status: DC

## 2011-12-01 NOTE — Patient Instructions (Addendum)
Today we updated your med list in our EPIC system...    Continue your current medications the same...  We decided to change the Alvesco to [facility]- 2 inhalations once daily...  Keep up the good work w/ wt reduction & smoking cessation...  Call for any problems...  Let's plan a follow up visit in 6 months w/ CXR & FASTING blood work at that time.Marland KitchenMarland Kitchen
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
## 2012-04-15 ENCOUNTER — Telehealth: Payer: Self-pay | Admitting: Pulmonary Disease

## 2012-04-15 DIAGNOSIS — G4733 Obstructive sleep apnea (adult) (pediatric): Secondary | ICD-10-CM

## 2012-04-15 NOTE — Telephone Encounter (Signed)
Pt is aware. Order has been sent to [facility]. Nothing further was needed

## 2012-04-15 NOTE — Telephone Encounter (Signed)
Spoke with patient-he would like to have CPAP cut back down between 10-12 as now its set up to 15, makes it "blow his face off". Has new mask from DME since last visit as well. Pt aware that KC is back in the office this afternoon.

## 2012-04-15 NOTE — Telephone Encounter (Signed)
Ok to decrease cpap to 12cm.

## 2012-05-31 ENCOUNTER — Ambulatory Visit: Payer: 59 | Admitting: Pulmonary Disease

## 2012-06-29 ENCOUNTER — Other Ambulatory Visit: Payer: Self-pay | Admitting: Pulmonary Disease

## 2012-07-18 ENCOUNTER — Telehealth: Payer: Self-pay | Admitting: Pulmonary Disease

## 2012-07-18 DIAGNOSIS — N486 Induration penis plastica: Secondary | ICD-10-CM

## 2012-07-18 NOTE — Telephone Encounter (Signed)
I spoke with spouse and she stated pt has a tremendous "bend in his penis". Pt can feel a hard spot on top of the penis but spouse can;t feel anything. She states when pt has an erection it curves tremendously and she thinks pt needs to go have this checked out. Pt has noticed this in the past couple weeks. Per spouse pt has not had an injury to it. Please advise SN thanks

## 2012-07-18 NOTE — Telephone Encounter (Signed)
Per SN---  This is peyronies disease.   Will need urology eval.  Called and spoke with pts wife and she is aware of referral that has been placed for the pt.  Nothing further is needed.

## 2012-09-26 ENCOUNTER — Telehealth: Payer: Self-pay | Admitting: Pulmonary Disease

## 2012-09-26 NOTE — Telephone Encounter (Signed)
Called pt to make next ov per recall.  Pt's # has changed since last ov.  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  I mailed recall letter to home address on 09/26/12. Vincent Sutton

## 2012-10-04 ENCOUNTER — Other Ambulatory Visit: Payer: Self-pay | Admitting: Pulmonary Disease

## 2012-10-28 ENCOUNTER — Emergency Department (HOSPITAL_COMMUNITY)
Admission: EM | Admit: 2012-10-28 | Discharge: 2012-10-28 | Disposition: A | Discharge status: Home / Self Care | Payer: BC Managed Care – PPO | Source: Home / Self Care | Attending: Emergency Medicine | Admitting: Emergency Medicine

## 2012-10-28 ENCOUNTER — Emergency Department (INDEPENDENT_AMBULATORY_CARE_PROVIDER_SITE_OTHER): Payer: BC Managed Care – PPO

## 2012-10-28 ENCOUNTER — Encounter (HOSPITAL_COMMUNITY): Payer: Self-pay | Admitting: *Deleted

## 2012-10-28 DIAGNOSIS — J40 Bronchitis, not specified as acute or chronic: Secondary | ICD-10-CM

## 2012-10-28 MED ORDER — ALBUTEROL SULFATE (5 MG/ML) 0.5% IN NEBU
INHALATION_SOLUTION | RESPIRATORY_TRACT | Status: AC
  Filled 2012-10-28: qty 1

## 2012-10-28 MED ORDER — SULFAMETHOXAZOLE-TMP DS 800-160 MG PO TABS: 1.0000 | ORAL_TABLET | Freq: Two times a day (BID) | ORAL | Status: DC

## 2012-10-28 MED ORDER — SODIUM CHLORIDE 0.9 % IJ SOLN
INTRAMUSCULAR | Status: AC
  Filled 2012-10-28: qty 3

## 2012-10-28 MED ORDER — PREDNISONE 10 MG PO TABS: 50.0000 mg | ORAL_TABLET | Freq: Every day | ORAL | Status: DC

## 2012-10-28 MED ORDER — IPRATROPIUM BROMIDE 0.02 % IN SOLN
0.5000 mg | Freq: Once | RESPIRATORY_TRACT | Status: AC
  Administered 2012-10-28: 0.5 mg via RESPIRATORY_TRACT

## 2012-10-28 MED ORDER — ALBUTEROL SULFATE (5 MG/ML) 0.5% IN NEBU
5.0000 mg | INHALATION_SOLUTION | Freq: Once | RESPIRATORY_TRACT | Status: AC
  Administered 2012-10-28: 5 mg via RESPIRATORY_TRACT

## 2012-10-28 MED ORDER — ALBUTEROL SULFATE HFA 108 (90 BASE) MCG/ACT IN AERS: 2.0000 | INHALATION_SPRAY | RESPIRATORY_TRACT | Status: DC | PRN

## 2012-10-28 NOTE — ED Provider Notes (Signed)
Medical screening examination/treatment/procedure(s) were performed by non-physician practitioner and as supervising physician I was immediately available for consultation/collaboration.  Leslee Home, M.D.  Reuben Likes, MD 10/28/12 [phone]

## 2012-10-28 NOTE — ED Notes (Signed)
Pt  Has   Symptoms  Of  Cough  /  Congested          With  Symptoms  Of  Malaise  /    Fatigue  As  Well          Pt  Reports a  sorethroat         As  Well      Pt    Is  Smoker    With a  History  Of   Asthma            He  Takes  spiriva
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
## 2012-11-08 ENCOUNTER — Ambulatory Visit: Payer: BC Managed Care – PPO | Admitting: Pulmonary Disease

## 2012-12-10 ENCOUNTER — Ambulatory Visit (INDEPENDENT_AMBULATORY_CARE_PROVIDER_SITE_OTHER): Payer: BC Managed Care – PPO | Admitting: Pulmonary Disease

## 2012-12-10 ENCOUNTER — Encounter: Payer: Self-pay | Admitting: Pulmonary Disease

## 2012-12-10 VITALS — BP 112/76 | HR 89 | Temp 97.9°F | Ht 68.0 in | Wt 224.6 lb

## 2012-12-10 DIAGNOSIS — Z23 Encounter for immunization: Secondary | ICD-10-CM

## 2012-12-10 DIAGNOSIS — G4733 Obstructive sleep apnea (adult) (pediatric): Secondary | ICD-10-CM

## 2012-12-10 NOTE — Assessment & Plan Note (Signed)
The patient is doing well on his current CPAP setting of 12 cm of water.  He feels that he is sleeping well, is having no issues with his pressure or mask fit. I have asked him to continue with CPAP, and to work aggressively on weight loss.

## 2012-12-10 NOTE — Progress Notes (Signed)
  Subjective:    Patient ID: Vincent Sutton, male    DOB: [birth year], 52 y.o.   MRN: [id number]  HPI The patient comes in today for followup of his obstructive sleep apnea.  He is wearing CPAP compliantly at 12 cm, but was not able to tolerate 14-15 cm of water.  He sleeps very well on this setting, and feels rested during the day.  He is having no issues with his mask at this time.  The patient's weight is stable since the last visit.   Review of Systems  Constitutional: Negative for fever and unexpected weight change.  HENT: Negative for congestion, dental problem, ear pain, nosebleeds, postnasal drip, rhinorrhea, sinus pressure, sneezing, sore throat and trouble swallowing.   Eyes: Negative for redness and itching.  Respiratory: Negative for cough, chest tightness, shortness of breath and wheezing.   Cardiovascular: Negative for palpitations and leg swelling.  Gastrointestinal: Negative for nausea and vomiting.  Genitourinary: Negative for dysuria.  Musculoskeletal: Negative for joint swelling.  Skin: Negative for rash.  Neurological: Negative for headaches.  Hematological: Does not bruise/bleed easily.  Psychiatric/Behavioral: Negative for dysphoric mood. The patient is not nervous/anxious.        Objective:   Physical Exam Obese male in no acute distress Nose without purulence or discharge noted No skin breakdown or pressure necrosis from the CPAP mask Neck without lymphadenopathy or thyromegaly Lower extremities without edema, no cyanosis Alert and oriented, does not appear to be sleepy, moves all 4 extremities.       Assessment & Plan:

## 2012-12-10 NOTE — Patient Instructions (Signed)
Continue with cpap, and keep up with mask changes and supplies. Work on [employer] loss Will give you the flu shot today. followup with me in one year.

## 2013-02-20 ENCOUNTER — Encounter (HOSPITAL_COMMUNITY): Payer: Self-pay | Admitting: Emergency Medicine

## 2013-02-20 ENCOUNTER — Emergency Department (HOSPITAL_COMMUNITY)
Admission: EM | Admit: 2013-02-20 | Discharge: 2013-02-20 | Disposition: A | Discharge status: Home / Self Care | Payer: BC Managed Care – PPO | Source: Home / Self Care | Attending: Family Medicine | Admitting: Family Medicine

## 2013-02-20 ENCOUNTER — Emergency Department (INDEPENDENT_AMBULATORY_CARE_PROVIDER_SITE_OTHER): Payer: BC Managed Care – PPO

## 2013-02-20 DIAGNOSIS — Z720 Tobacco use: Secondary | ICD-10-CM

## 2013-02-20 DIAGNOSIS — J40 Bronchitis, not specified as acute or chronic: Secondary | ICD-10-CM

## 2013-02-20 DIAGNOSIS — J410 Simple chronic bronchitis: Secondary | ICD-10-CM

## 2013-02-20 MED ORDER — LEVOFLOXACIN 500 MG PO TABS: 500.0000 mg | ORAL_TABLET | Freq: Every day | ORAL | Status: DC

## 2013-02-20 NOTE — Discharge Instructions (Signed)
Take all of medicine, drink lots of fluids,use mucinex , no more smoking, see your doctor if further problems
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
## 2013-02-20 NOTE — ED Notes (Signed)
Pt  Reports  Shortness  Of  Breath  And  A  persistant  Cough  For  sev  Days   Pt  Is  Smoker

## 2013-04-05 ENCOUNTER — Emergency Department (HOSPITAL_COMMUNITY)
Admission: EM | Admit: 2013-04-05 | Discharge: 2013-04-05 | Disposition: A | Discharge status: Home / Self Care | Payer: BC Managed Care – PPO | Source: Home / Self Care | Attending: Family Medicine | Admitting: Family Medicine

## 2013-04-05 ENCOUNTER — Encounter (HOSPITAL_COMMUNITY): Payer: Self-pay | Admitting: Emergency Medicine

## 2013-04-05 DIAGNOSIS — R040 Epistaxis: Secondary | ICD-10-CM

## 2013-04-05 NOTE — Discharge Instructions (Signed)
Use medicine as discussed, saline nose spray regularly, return as needed
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
## 2013-04-05 NOTE — ED Notes (Signed)
Episodic nose bleed since Tuesday; no active bleeding . Denies any injury that may have caused bleeding

## 2013-05-17 ENCOUNTER — Other Ambulatory Visit: Payer: Self-pay | Admitting: Pulmonary Disease

## 2013-05-20 ENCOUNTER — Other Ambulatory Visit: Payer: Self-pay | Admitting: Pulmonary Disease

## 2013-10-22 ENCOUNTER — Other Ambulatory Visit: Payer: Self-pay | Admitting: Pulmonary Disease

## 2013-10-27 ENCOUNTER — Other Ambulatory Visit: Payer: Self-pay | Admitting: Pulmonary Disease

## 2013-10-29 ENCOUNTER — Emergency Department (HOSPITAL_COMMUNITY)
Admission: EM | Admit: 2013-10-29 | Discharge: 2013-10-29 | Disposition: A | Discharge status: Home / Self Care | Payer: No Typology Code available for payment source | Source: Home / Self Care | Attending: Family Medicine | Admitting: Family Medicine

## 2013-10-29 ENCOUNTER — Encounter (HOSPITAL_COMMUNITY): Payer: Self-pay | Admitting: Emergency Medicine

## 2013-10-29 DIAGNOSIS — J441 Chronic obstructive pulmonary disease with (acute) exacerbation: Secondary | ICD-10-CM

## 2013-10-29 MED ORDER — ALBUTEROL SULFATE (2.5 MG/3ML) 0.083% IN NEBU
5.0000 mg | INHALATION_SOLUTION | Freq: Once | RESPIRATORY_TRACT | Status: AC
  Administered 2013-10-29: 5 mg via RESPIRATORY_TRACT

## 2013-10-29 MED ORDER — ALBUTEROL SULFATE (2.5 MG/3ML) 0.083% IN NEBU
INHALATION_SOLUTION | RESPIRATORY_TRACT | Status: AC
  Filled 2013-10-29: qty 6

## 2013-10-29 MED ORDER — METHYLPREDNISOLONE SODIUM SUCC 125 MG IJ SOLR
125.0000 mg | Freq: Once | INTRAMUSCULAR | Status: AC
  Administered 2013-10-29: 125 mg via INTRAMUSCULAR

## 2013-10-29 MED ORDER — IPRATROPIUM BROMIDE 0.02 % IN SOLN
0.5000 mg | Freq: Once | RESPIRATORY_TRACT | Status: AC
  Administered 2013-10-29: 0.5 mg via RESPIRATORY_TRACT

## 2013-10-29 MED ORDER — METHYLPREDNISOLONE SODIUM SUCC 125 MG IJ SOLR
INTRAMUSCULAR | Status: AC
  Filled 2013-10-29: qty 2

## 2013-10-29 MED ORDER — IPRATROPIUM BROMIDE 0.02 % IN SOLN
RESPIRATORY_TRACT | Status: AC
  Filled 2013-10-29: qty 2.5

## 2013-10-29 MED ORDER — TIOTROPIUM BROMIDE MONOHYDRATE 18 MCG IN CAPS: 18.0000 ug | ORAL_CAPSULE | Freq: Every morning | RESPIRATORY_TRACT | Status: DC

## 2013-10-29 NOTE — ED Notes (Signed)
C/o SOB onset yesterday. 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  Has occasional cough prod of white sputum.  Smoked a few cigarettes on Labor Day, but had quit smoking for several months.  Has been wheezing the last couple of days.
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
## 2013-10-29 NOTE — Discharge Instructions (Signed)
See dr Lenna Gilford as planned for further medicine, no more smoking.

## 2013-12-10 ENCOUNTER — Ambulatory Visit: Payer: BC Managed Care – PPO | Admitting: Pulmonary Disease

## 2014-01-02 ENCOUNTER — Encounter (INDEPENDENT_AMBULATORY_CARE_PROVIDER_SITE_OTHER): Payer: Self-pay

## 2014-01-02 ENCOUNTER — Encounter: Payer: Self-pay | Admitting: Pulmonary Disease

## 2014-01-02 ENCOUNTER — Ambulatory Visit (INDEPENDENT_AMBULATORY_CARE_PROVIDER_SITE_OTHER): Payer: No Typology Code available for payment source | Admitting: Pulmonary Disease

## 2014-01-02 ENCOUNTER — Ambulatory Visit: Payer: No Typology Code available for payment source | Admitting: *Deleted

## 2014-01-02 VITALS — BP 110/70 | HR 75 | Temp 97.2°F | Ht 68.0 in | Wt 221.7 lb

## 2014-01-02 DIAGNOSIS — Z23 Encounter for immunization: Secondary | ICD-10-CM

## 2014-01-02 DIAGNOSIS — G4733 Obstructive sleep apnea (adult) (pediatric): Secondary | ICD-10-CM

## 2014-01-02 NOTE — Assessment & Plan Note (Signed)
The patient is doing very well from a sleep apnea standpoint with his current C Pap set up. He feels that he sleeps well with the device, and is satisfied with his daytime alertness. I asked him to keep up with his mask changes and supplies, and to continue working on weight loss.

## 2014-01-02 NOTE — Patient Instructions (Signed)
Stay on cpap, and keep up with mask changes and supplies. Work on weight loss followup with me again in one year

## 2014-01-02 NOTE — Progress Notes (Signed)
   Subjective:    Patient ID: Vincent Sutton, male    DOB: February 03, 1961, 53 y.o.   MRN: [id number]  HPI Patient comes in today for follow-up of his obstructive sleep apnea. He tells me that he is wearing CPAP compliantly, and feels that he is sleeping well with the device. He is having no mask or pressure issues, and feels rested during the day. Of note, his weight is down 3 pounds from the last visit.   Review of Systems  Constitutional: Negative for fever and unexpected weight change.  HENT: Positive for congestion, postnasal drip, sinus pressure and sneezing. Negative for dental problem, ear pain, nosebleeds, rhinorrhea, sore throat and trouble swallowing.   Eyes: Negative for redness and itching.  Respiratory: Positive for cough, shortness of breath and wheezing. Negative for chest tightness.   Cardiovascular: Negative for palpitations and leg swelling.  Gastrointestinal: Negative for nausea and vomiting.  Genitourinary: Negative for dysuria.  Musculoskeletal: Negative for joint swelling.  Skin: Negative for rash.  Neurological: Negative for headaches.  Hematological: Does not bruise/bleed easily.  Psychiatric/Behavioral: Negative for dysphoric mood. The patient is not nervous/anxious.        Objective:   Physical Exam Overweight male in no acute distress Nose without purulence or discharge noted No skin breakdown or pressure necrosis from the C Pap mask Neck without lymphadenopathy or thyromegaly Lower extremities with mild edema, no cyanosis Alert and oriented, moves all 4 extremities.       Assessment & Plan:

## 2014-01-19 ENCOUNTER — Telehealth: Payer: Self-pay | Admitting: Pulmonary Disease

## 2014-01-19 MED ORDER — TIOTROPIUM BROMIDE MONOHYDRATE 18 MCG IN CAPS: ORAL_CAPSULE | RESPIRATORY_TRACT | Status: DC

## 2014-01-19 NOTE — Telephone Encounter (Signed)
Refill was denied because last OV with SN was 12-2011. Appt set to see SN for COPD. Refill sent. [facility], CMA

## 2014-01-27 ENCOUNTER — Ambulatory Visit (INDEPENDENT_AMBULATORY_CARE_PROVIDER_SITE_OTHER): Payer: No Typology Code available for payment source | Admitting: Pulmonary Disease

## 2014-01-27 ENCOUNTER — Encounter: Payer: Self-pay | Admitting: Pulmonary Disease

## 2014-01-27 VITALS — BP 134/86 | HR 88 | Temp 98.4°F | Ht 68.0 in | Wt 224.2 lb

## 2014-01-27 DIAGNOSIS — J45901 Unspecified asthma with (acute) exacerbation: Principal | ICD-10-CM

## 2014-01-27 DIAGNOSIS — J449 Chronic obstructive pulmonary disease, unspecified: Secondary | ICD-10-CM

## 2014-01-27 DIAGNOSIS — F17210 Nicotine dependence, cigarettes, uncomplicated: Secondary | ICD-10-CM

## 2014-01-27 DIAGNOSIS — Z720 Tobacco use: Secondary | ICD-10-CM

## 2014-01-27 DIAGNOSIS — J441 Chronic obstructive pulmonary disease with (acute) exacerbation: Secondary | ICD-10-CM | POA: Insufficient documentation

## 2014-01-27 MED ORDER — MOMETASONE FURO-FORMOTEROL FUM 200-5 MCG/ACT IN AERO: 2.0000 | INHALATION_SPRAY | Freq: Two times a day (BID) | RESPIRATORY_TRACT | Status: DC

## 2014-01-27 MED ORDER — METHYLPREDNISOLONE ACETATE 80 MG/ML IJ SUSP
80.0000 mg | Freq: Once | INTRAMUSCULAR | Status: AC
  Administered 2014-01-27: 80 mg via INTRAMUSCULAR

## 2014-01-27 MED ORDER — LEVOFLOXACIN 500 MG PO TABS: 500.0000 mg | ORAL_TABLET | Freq: Every day | ORAL | Status: DC

## 2014-01-27 MED ORDER — PREDNISONE 20 MG PO TABS: ORAL_TABLET | ORAL | Status: DC

## 2014-01-27 MED ORDER — TIOTROPIUM BROMIDE MONOHYDRATE 18 MCG IN CAPS: ORAL_CAPSULE | RESPIRATORY_TRACT | Status: DC
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
## 2014-01-27 NOTE — Patient Instructions (Signed)
Today we updated your med list in our EPIC system...    Continue your current medications the same...  For your bronchitic exacerbation & underlying Stage3 COPD >>     We are treating the bronchial infection w/ LEVAQUIN 500mg  take one tab daily til gone...    For the airway inflammation- we gave you a Depo shot today...    In the AM start the PREDNISONE 20mg  tabs taking one tab twice daily for 4 days,    Then one tab each AM for 4 days,     Then 1/2 tab daily for 4 days,     Then 1/2 tab every other day til gone...  On a regular basis for your COPD >>    Continue the [facility] daily...    Add the new DULERA200- 2 spays twice daily    You must quit ALL the smoking!!!  Continue the Mucinex 600mg  - one tab up to 4 times daily w/ Fluids...  Remember to get a copy of your insurance company "prescription drug formulary" and bring it w/ you al all your doctor visits...  Call for any questions...  Let's plan a follow up visit in several months>  We will plan to do your FASTING blood work & f/u CXR and breathing tests at that time... Bring your meds and Formulary so we can make any adjustments needed.Marland KitchenMarland Kitchen

## 2014-03-25 ENCOUNTER — Telehealth: Payer: Self-pay | Admitting: Pulmonary Disease

## 2014-03-25 MED ORDER — ALBUTEROL SULFATE HFA 108 (90 BASE) MCG/ACT IN AERS: 2.0000 | INHALATION_SPRAY | Freq: Four times a day (QID) | RESPIRATORY_TRACT | Status: DC | PRN

## 2014-03-25 NOTE — Telephone Encounter (Signed)
Called and spoke with pt and he is aware that medication has been sent to his pharmacy and nothing further is needed.

## 2014-04-03 ENCOUNTER — Ambulatory Visit (INDEPENDENT_AMBULATORY_CARE_PROVIDER_SITE_OTHER)
Admission: RE | Admit: 2014-04-03 | Discharge: 2014-04-03 | Disposition: A | Discharge status: Home / Self Care | Payer: No Typology Code available for payment source | Source: Ambulatory Visit | Attending: Pulmonary Disease | Admitting: Pulmonary Disease

## 2014-04-03 ENCOUNTER — Other Ambulatory Visit (INDEPENDENT_AMBULATORY_CARE_PROVIDER_SITE_OTHER): Payer: No Typology Code available for payment source

## 2014-04-03 ENCOUNTER — Encounter: Payer: Self-pay | Admitting: Pulmonary Disease

## 2014-04-03 ENCOUNTER — Ambulatory Visit (INDEPENDENT_AMBULATORY_CARE_PROVIDER_SITE_OTHER): Payer: No Typology Code available for payment source | Admitting: Pulmonary Disease

## 2014-04-03 ENCOUNTER — Telehealth: Payer: Self-pay | Admitting: Pulmonary Disease

## 2014-04-03 VITALS — BP 138/88 | HR 87 | Temp 98.4°F | Ht 68.0 in | Wt 218.0 lb

## 2014-04-03 DIAGNOSIS — J449 Chronic obstructive pulmonary disease, unspecified: Secondary | ICD-10-CM | POA: Diagnosis not present

## 2014-04-03 DIAGNOSIS — M150 Primary generalized (osteo)arthritis: Secondary | ICD-10-CM

## 2014-04-03 DIAGNOSIS — E785 Hyperlipidemia, unspecified: Secondary | ICD-10-CM | POA: Diagnosis not present

## 2014-04-03 DIAGNOSIS — K760 Fatty (change of) liver, not elsewhere classified: Secondary | ICD-10-CM

## 2014-04-03 DIAGNOSIS — G4733 Obstructive sleep apnea (adult) (pediatric): Secondary | ICD-10-CM

## 2014-04-03 DIAGNOSIS — M159 Polyosteoarthritis, unspecified: Secondary | ICD-10-CM

## 2014-04-03 DIAGNOSIS — N400 Enlarged prostate without lower urinary tract symptoms: Secondary | ICD-10-CM

## 2014-04-03 DIAGNOSIS — E669 Obesity, unspecified: Secondary | ICD-10-CM | POA: Diagnosis not present

## 2014-04-03 DIAGNOSIS — R918 Other nonspecific abnormal finding of lung field: Secondary | ICD-10-CM

## 2014-04-03 LAB — BASIC METABOLIC PANEL
BUN: 11 mg/dL (ref 6–23)
CALCIUM: 9.9 mg/dL (ref 8.4–10.5)
CO2: 33 meq/L — AB (ref 19–32)
Chloride: 100 mEq/L (ref 96–112)
Creatinine, Ser: 0.84 mg/dL (ref 0.40–1.50)
GFR: 101.3 mL/min (ref >60.00)
Glucose, Bld: 89 mg/dL (ref 70–99)
Potassium: 4.4 mEq/L (ref 3.5–5.1)
Sodium: 137 mEq/L (ref 135–145)

## 2014-04-03 LAB — CBC WITH DIFFERENTIAL/PLATELET
BASOS ABS: 0.1 10*3/uL (ref 0.0–0.1)
Basophils Relative: 1 % (ref 0.0–3.0)
Eosinophils Absolute: 0.3 10*3/uL (ref 0.0–0.7)
Eosinophils Relative: 2.3 % (ref 0.0–5.0)
HEMATOCRIT: 40.1 % (ref 39.0–52.0)
HEMOGLOBIN: 13.8 g/dL (ref 13.0–17.0)
LYMPHS ABS: 2.4 10*3/uL (ref 0.7–4.0)
LYMPHS PCT: 20.2 % (ref 12.0–46.0)
MCHC: 34.5 g/dL (ref 30.0–36.0)
MCV: 85.9 fl (ref 78.0–100.0)
MONO ABS: 1.3 10*3/uL — AB (ref 0.1–1.0)
MONOS PCT: 10.6 % (ref 3.0–12.0)
NEUTROS ABS: 7.8 10*3/uL — AB (ref 1.4–7.7)
Neutrophils Relative %: 65.9 % (ref 43.0–77.0)
Platelets: 523 10*3/uL — ABNORMAL HIGH (ref 150.0–400.0)
RBC: 4.67 Mil/uL (ref 4.22–5.81)
RDW: 15.5 % (ref 11.5–15.5)
WBC: 11.8 10*3/uL — AB (ref 4.0–10.5)

## 2014-04-03 LAB — HEPATIC FUNCTION PANEL
ALBUMIN: 4.1 g/dL (ref 3.5–5.2)
ALK PHOS: 139 U/L — AB (ref 39–117)
ALT: 17 U/L (ref 0–53)
AST: 15 U/L (ref 0–37)
BILIRUBIN DIRECT: 0 mg/dL (ref 0.0–0.3)
BILIRUBIN TOTAL: 0.4 mg/dL (ref 0.2–1.2)
Total Protein: 8.8 g/dL — ABNORMAL HIGH (ref 6.0–8.3)

## 2014-04-03 LAB — LIPID PANEL
Cholesterol: 184 mg/dL (ref 0–200)
HDL: 33.9 mg/dL — AB (ref >39.00)
LDL Cholesterol: 125 mg/dL — ABNORMAL HIGH (ref 0–99)
NonHDL: 150.1
Total CHOL/HDL Ratio: 5
Triglycerides: 127 mg/dL (ref 0.0–149.0)
VLDL: 25.4 mg/dL (ref 0.0–40.0)

## 2014-04-03 LAB — URIC ACID: URIC ACID, SERUM: 6.7 mg/dL (ref 4.0–7.8)

## 2014-04-03 LAB — TSH: TSH: 2.01 u[IU]/mL (ref 0.35–4.50)

## 2014-04-03 LAB — PSA: PSA: 0.9 ng/mL (ref 0.10–4.00)

## 2014-04-03 NOTE — Telephone Encounter (Signed)
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  Order placed. Suanne Marker, [facility], aware to schedule. SN to call pt. Nothing further needed.

## 2014-04-03 NOTE — Patient Instructions (Signed)
Today we updated your med list in our EPIC system...    Continue your current medications the same...  Today we did your follow up breathing test, CXR, & FASTING blood work...    We will contact you w/ the results when available...   Continue the Dulera twice daily & the Spiriva daily...  Remember the importance of smoking cessation!!!  If you get congested> add in the Mucunex 1200mg  twice daily w/ lots of fluids...  Try a nasal saline mist to help the middle ear drain...  Call for any questions...  Let's plan a follow up visit in 73mo, sooner if needed for problems.Marland KitchenMarland Kitchen

## 2014-04-03 NOTE — Telephone Encounter (Signed)
Received call report from Davis with [facility] regarding cxr from today.  Results in epic verified with Cassandra.  Results printed and given to Dr. Lenna Gilford.  Please advise.  Thank you.

## 2014-04-04 ENCOUNTER — Encounter: Payer: Self-pay | Admitting: Pulmonary Disease
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
## 2014-04-07 ENCOUNTER — Ambulatory Visit (INDEPENDENT_AMBULATORY_CARE_PROVIDER_SITE_OTHER)
Admission: RE | Admit: 2014-04-07 | Discharge: 2014-04-07 | Disposition: A | Discharge status: Home / Self Care | Payer: No Typology Code available for payment source | Source: Ambulatory Visit | Attending: Pulmonary Disease | Admitting: Pulmonary Disease

## 2014-04-07 DIAGNOSIS — R918 Other nonspecific abnormal finding of lung field: Secondary | ICD-10-CM

## 2014-04-07 MED ORDER — IOHEXOL 300 MG/ML  SOLN
80.0000 mL | Freq: Once | INTRAMUSCULAR | Status: AC | PRN
  Administered 2014-04-07: 80 mL via INTRAVENOUS

## 2014-04-08 ENCOUNTER — Telehealth: Payer: Self-pay | Admitting: Pulmonary Disease

## 2014-04-08 NOTE — Telephone Encounter (Signed)
Pt states that everything is worked out- spoke with Mindy this morning. Pt was needing a Bronch set up - this was scheduled. Nothing further needed.

## 2014-04-10 ENCOUNTER — Telehealth: Payer: Self-pay | Admitting: Pulmonary Disease

## 2014-04-10 MED ORDER — ALPRAZOLAM 0.25 MG PO TABS: ORAL_TABLET | ORAL | Status: DC

## 2014-04-10 NOTE — Telephone Encounter (Signed)
May have Xanax 0.25mg  1/2 -1 At bedtime  As needed  Anxiety #20 , no refills  Please contact office for sooner follow up if symptoms do not improve or worsen or seek emergency care

## 2014-04-10 NOTE — Telephone Encounter (Signed)
Called spoke with spouse, advised of TP's recs regarding the alprazolam Threasa Beards voiced her understanding and denied any further questions/concerns - will call the office if anything further is needed or seek emergency care Rx to be telephoned to [facility] and spoke with pharmacist Cristie Hem VO given for the alprazolam and med list updated  Nothing further needed at this time; will sign off.

## 2014-04-10 NOTE — Telephone Encounter (Signed)
Called and spoke with pts wife and she stated that since the pt got his results last week from SN about the lung lesion, he has been stressing.  He is scheduled for the bronch next week.  He is c.o not being able to sleep at night and feels his heart racing at times and feels on edge.  Pt is requesting that something be called to his pharmacy to help with this.  SN is out of the office today.  TP please advise. Thanks  No Known Allergies  Current Outpatient Prescriptions on File Prior to Visit  Medication Sig Dispense Refill  . albuterol (PROVENTIL HFA;VENTOLIN HFA) 108 (90 BASE) MCG/ACT inhaler Inhale 2 puffs into the lungs every 6 (six) hours as needed for wheezing or shortness of breath. 1 Inhaler 6  . mometasone-formoterol (DULERA) 200-5 MCG/ACT AERO Inhale 2 puffs into the lungs 2 (two) times daily. 1 Inhaler 6  . tiotropium (SPIRIVA HANDIHALER) 18 MCG inhalation capsule PLACE 1 CAPSULE (18 MCG TOTAL) INTO INHALER AND INHALE DAILY. 30 capsule 11   No current facility-administered medications on file prior to visit.

## 2014-04-15 ENCOUNTER — Ambulatory Visit (HOSPITAL_COMMUNITY)
Admission: RE | Admit: 2014-04-15 | Discharge: 2014-04-15 | Disposition: A | Discharge status: Home / Self Care | Payer: No Typology Code available for payment source | Source: Ambulatory Visit | Attending: Pulmonary Disease | Admitting: Pulmonary Disease

## 2014-04-15 ENCOUNTER — Encounter (HOSPITAL_COMMUNITY)
Admission: RE | Disposition: A | Discharge status: Home / Self Care | Payer: Self-pay | Source: Ambulatory Visit | Attending: Pulmonary Disease

## 2014-04-15 DIAGNOSIS — C3412 Malignant neoplasm of upper lobe, left bronchus or lung: Secondary | ICD-10-CM | POA: Insufficient documentation

## 2014-04-15 DIAGNOSIS — J449 Chronic obstructive pulmonary disease, unspecified: Secondary | ICD-10-CM

## 2014-04-15 DIAGNOSIS — R918 Other nonspecific abnormal finding of lung field: Secondary | ICD-10-CM | POA: Diagnosis present

## 2014-04-15 HISTORY — PX: VIDEO BRONCHOSCOPY: SHX5072

## 2014-04-15 SURGERY — VIDEO BRONCHOSCOPY WITHOUT FLUORO
Anesthesia: Moderate Sedation | Laterality: Bilateral

## 2014-04-15 MED ORDER — EPINEPHRINE HCL 0.1 MG/ML IJ SOSY
PREFILLED_SYRINGE | INTRAMUSCULAR | Status: DC | PRN
  Administered 2014-04-15: 1 via ENDOTRACHEOPULMONARY

## 2014-04-15 MED ORDER — MIDAZOLAM HCL 10 MG/2ML IJ SOLN
INTRAMUSCULAR | Status: DC | PRN
  Administered 2014-04-15 (×2): 2.5 mg via INTRAVENOUS
  Administered 2014-04-15: 5 mg via INTRAVENOUS

## 2014-04-15 MED ORDER — MIDAZOLAM HCL 10 MG/2ML IJ SOLN
INTRAMUSCULAR | Status: AC
  Filled 2014-04-15: qty 4

## 2014-04-15 MED ORDER — MEPERIDINE HCL 100 MG/ML IJ SOLN
50.0000 mg | Freq: Once | INTRAMUSCULAR | Status: AC
  Administered 2014-04-15: 50 mg via INTRAVENOUS

## 2014-04-15 MED ORDER — LIDOCAINE HCL 2 % EX GEL
CUTANEOUS | Status: DC | PRN
  Administered 2014-04-15: 1

## 2014-04-15 MED ORDER — SODIUM CHLORIDE 0.9 % IV SOLN: Freq: Once | INTRAVENOUS | Status: DC

## 2014-04-15 MED ORDER — MEPERIDINE HCL 100 MG/ML IJ SOLN
INTRAMUSCULAR | Status: AC
  Filled 2014-04-15: qty 2

## 2014-04-15 MED ORDER — SODIUM CHLORIDE 0.9 % IV SOLN
INTRAVENOUS | Status: DC
  Administered 2014-04-15: 07:00:00 via INTRAVENOUS

## 2014-04-15 MED ORDER — PHENYLEPHRINE HCL 0.25 % NA SOLN: 1.0000 | Freq: Four times a day (QID) | NASAL | Status: DC | PRN

## 2014-04-15 MED ORDER — BUTAMBEN-TETRACAINE-BENZOCAINE 2-2-14 % EX AERO: 1.0000 | INHALATION_SPRAY | Freq: Once | CUTANEOUS | Status: DC

## 2014-04-15 MED ORDER — LIDOCAINE HCL 2 % EX GEL: 1.0000 "application " | Freq: Once | CUTANEOUS | Status: DC

## 2014-04-15 MED ORDER — LIDOCAINE HCL 1 % IJ SOLN
INTRAMUSCULAR | Status: DC | PRN
Start: 2014-04-15 — End: 2014-04-15
  Administered 2014-04-15: 6 mL

## 2014-04-15 MED ORDER — PHENYLEPHRINE HCL 0.25 % NA SOLN
NASAL | Status: DC | PRN
  Administered 2014-04-15: 2 via NASAL

## 2014-04-15 NOTE — Op Note (Signed)
NAMEDRAYCE, TAWIL NO.:  [id number]  MEDICAL RECORD NO.:  [id number]  LOCATION:  [facility]                         FACILITY:  [facility]  PHYSICIAN:  Kathee Delton, MD,FCCPDATE OF BIRTH:  12-11-1960  DATE OF PROCEDURE:  04/15/2014 DATE OF DISCHARGE:                              OPERATIVE REPORT   PROCEDURE:  Flexible fiberoptic bronchoscopy with biopsy.  OPERATOR:  Kathee Delton, MD, FCCP.  INDICATION FOR THE PROCEDURE:  Left upper lobe mass of unknown origin.  ANESTHESIA:  Demerol 50 mg IV, Versed 10 mg IV in various aliquots, topical 1% lidocaine to vocal cords and airways during the procedure.  DESCRIPTION OF PROCEDURE:  After obtaining informed consent and under close cardiopulmonary monitoring, the above preop anesthesia was given and the fiberoptic scope was passed to the left naris and into posterior pharynx, where there was no lesions or abnormalities seen.  The vocal cords themselves appeared to be within normal limits and moved bilaterally on phonation.  The scope was then passed into the trachea, where it was examined along its entire length down to the level of the carina.  The right tracheobronchial tree was examined serially at the subsegmental level with no endobronchial abnormality being found. Attention was then paid to the left tracheobronchial tree where a fungating mass was coming from the left upper lobe orifice.  It did not appear to involve the secondary carina on the lower lobe site.  The left lower lobe was examined serially subsegmental level with no endobronchial abnormality being found.  Bronchial washes, brushes, and endobronchial pinch biopsies were done of the fungating mass in the left upper lobe without complication.  A transbronchial needle aspiration was also done of the area with multiple passes, good material being obtained.  Overall, the patient tolerated the procedure well and there were no complications.  There was good  hemostasis maintained throughout the procedure.     Kathee Delton, MD,FCCP     [facility]/MEDQ  D:  04/15/2014  T:  04/15/2014  Job:  [id number]

## 2014-04-15 NOTE — Op Note (Signed)
Dictation #:  [id number]

## 2014-04-15 NOTE — Discharge Instructions (Signed)
Flexible Bronchoscopy, Care After These instructions give you information on caring for yourself after your procedure. Your doctor may also give you more specific instructions. Call your doctor if you have any problems or questions after your procedure. HOME CARE  Do not eat or drink anything for 2 hours after your procedure. If you try to eat or drink before the medicine wears off, food or drink could go into your lungs. You could also burn yourself.  After 2 hours have passed and when you can cough and gag normally, you may eat soft food and drink liquids slowly.  The day after the test, you may eat your normal diet.  You may do your normal activities.  Keep all doctor visits. GET HELP RIGHT AWAY IF:  You get more and more short of breath.  You get light-headed.  You feel like you are going to pass out (faint).  You have chest pain.  You have new problems that worry you.  You cough up more than a little blood.  You cough up more blood than before. MAKE SURE YOU:  Understand these instructions.  Will watch your condition.  Will get help right away if you are not doing well or get worse. Document Released: 12/04/2008 Document Revised: 02/11/2013 Document Reviewed: 10/11/2012 [facility] Patient Information 2015 [place], [place]. This information is not intended to replace advice given to you by your health care provider. Make sure you discuss any questions you have with your health care provider.  Do not eat or drink until after 1000 today 04/15/14
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
## 2014-04-15 NOTE — Progress Notes (Signed)
Video bronchoscopy performed Intervention bronchial washings Intervention bronchial brushings Intervention bronchial biopsy Intervention needle biopsy Pt tolerated well  Kathie Dike RRT

## 2014-04-16 ENCOUNTER — Encounter (HOSPITAL_COMMUNITY): Payer: Self-pay | Admitting: Pulmonary Disease

## 2014-04-17 ENCOUNTER — Other Ambulatory Visit: Payer: Self-pay | Admitting: Pulmonary Disease

## 2014-04-17 ENCOUNTER — Telehealth: Payer: Self-pay | Admitting: *Deleted

## 2014-04-17 ENCOUNTER — Other Ambulatory Visit: Payer: Self-pay | Admitting: *Deleted

## 2014-04-17 DIAGNOSIS — R918 Other nonspecific abnormal finding of lung field: Secondary | ICD-10-CM

## 2014-04-17 DIAGNOSIS — C3412 Malignant neoplasm of upper lobe, left bronchus or lung: Secondary | ICD-10-CM

## 2014-04-17 NOTE — Telephone Encounter (Signed)
Called to set up appt with Dr. Julien Nordmann on 04/24/14 arrival at 9:00.  He verbalized understanding of appt time and place.

## 2014-04-19 LAB — CULTURE, BAL-QUANTITATIVE: SPECIAL REQUESTS: NORMAL

## 2014-04-19 LAB — CULTURE, BAL-QUANTITATIVE W GRAM STAIN: Colony Count: >=100000

## 2014-04-23 ENCOUNTER — Ambulatory Visit (HOSPITAL_COMMUNITY)
Admission: RE | Admit: 2014-04-23 | Discharge: 2014-04-23 | Disposition: A | Discharge status: Home / Self Care | Payer: No Typology Code available for payment source | Source: Ambulatory Visit | Attending: Pulmonary Disease | Admitting: Pulmonary Disease

## 2014-04-23 DIAGNOSIS — C3412 Malignant neoplasm of upper lobe, left bronchus or lung: Secondary | ICD-10-CM | POA: Insufficient documentation

## 2014-04-23 LAB — GLUCOSE, CAPILLARY: Glucose-Capillary: 106 mg/dL — ABNORMAL HIGH (ref 70–99)

## 2014-04-23 MED ORDER — FLUDEOXYGLUCOSE F - 18 (FDG) INJECTION
10.7000 | Freq: Once | INTRAVENOUS | Status: AC | PRN
  Administered 2014-04-23: 10.7 via INTRAVENOUS

## 2014-04-24 ENCOUNTER — Encounter: Payer: Self-pay | Admitting: *Deleted

## 2014-04-24 ENCOUNTER — Ambulatory Visit (HOSPITAL_BASED_OUTPATIENT_CLINIC_OR_DEPARTMENT_OTHER): Payer: No Typology Code available for payment source | Admitting: Internal Medicine

## 2014-04-24 ENCOUNTER — Other Ambulatory Visit (HOSPITAL_BASED_OUTPATIENT_CLINIC_OR_DEPARTMENT_OTHER): Payer: No Typology Code available for payment source

## 2014-04-24 ENCOUNTER — Telehealth: Payer: Self-pay | Admitting: Internal Medicine

## 2014-04-24 ENCOUNTER — Telehealth: Payer: Self-pay | Admitting: *Deleted

## 2014-04-24 ENCOUNTER — Encounter: Payer: Self-pay | Admitting: Internal Medicine

## 2014-04-24 VITALS — BP 116/100 | HR 71 | Temp 98.0°F | Resp 20 | Ht 68.0 in | Wt 225.6 lb

## 2014-04-24 DIAGNOSIS — C3492 Malignant neoplasm of unspecified part of left bronchus or lung: Secondary | ICD-10-CM

## 2014-04-24 DIAGNOSIS — C3412 Malignant neoplasm of upper lobe, left bronchus or lung: Secondary | ICD-10-CM

## 2014-04-24 DIAGNOSIS — R918 Other nonspecific abnormal finding of lung field: Secondary | ICD-10-CM

## 2014-04-24 HISTORY — DX: Malignant neoplasm of unspecified part of left bronchus or lung: C34.92

## 2014-04-24 LAB — CBC WITH DIFFERENTIAL/PLATELET
BASO%: 0.2 % (ref 0.0–2.0)
Basophils Absolute: 0 10*3/uL (ref 0.0–0.1)
EOS%: 1.4 % (ref 0.0–7.0)
Eosinophils Absolute: 0.2 10*3/uL (ref 0.0–0.5)
HEMATOCRIT: 40.4 % (ref 38.4–49.9)
HGB: 13.1 g/dL (ref 13.0–17.1)
LYMPH%: 20.2 % (ref 14.0–49.0)
MCH: 29.6 pg (ref 27.2–33.4)
MCHC: 32.4 g/dL (ref 32.0–36.0)
MCV: 91.4 fL (ref 79.3–98.0)
MONO#: 1.5 10*3/uL — ABNORMAL HIGH (ref 0.1–0.9)
MONO%: 12 % (ref 0.0–14.0)
NEUT#: 8.1 10*3/uL — ABNORMAL HIGH (ref 1.5–6.5)
NEUT%: 66.2 % (ref 39.0–75.0)
PLATELETS: 480 10*3/uL — AB (ref 140–400)
RBC: 4.42 10*6/uL (ref 4.20–5.82)
RDW: 15.6 % — ABNORMAL HIGH (ref 11.0–14.6)
WBC: 12.2 10*3/uL — ABNORMAL HIGH (ref 4.0–10.3)
lymph#: 2.5 10*3/uL (ref 0.9–3.3)

## 2014-04-24 LAB — COMPREHENSIVE METABOLIC PANEL (CC13)
ALK PHOS: 126 U/L (ref 40–150)
ALT: 20 U/L (ref 0–55)
AST: 19 U/L (ref 5–34)
Albumin: 3.6 g/dL (ref 3.5–5.0)
Anion Gap: 9 mEq/L (ref 3–11)
BUN: 8.9 mg/dL (ref 7.0–26.0)
CO2: 29 mEq/L (ref 22–29)
Calcium: 9.9 mg/dL (ref 8.4–10.4)
Chloride: 100 mEq/L (ref 98–109)
Creatinine: 0.8 mg/dL (ref 0.7–1.3)
GLUCOSE: 105 mg/dL (ref 70–140)
Potassium: 4.5 mEq/L (ref 3.5–5.1)
Sodium: 138 mEq/L (ref 136–145)
Total Bilirubin: 0.33 mg/dL (ref 0.20–1.20)
Total Protein: 8.3 g/dL (ref 6.4–8.3)

## 2014-04-24 LAB — TECHNOLOGIST REVIEW

## 2014-04-24 MED ORDER — PROCHLORPERAZINE MALEATE 10 MG PO TABS: 10.0000 mg | ORAL_TABLET | Freq: Four times a day (QID) | ORAL | Status: DC | PRN

## 2014-04-24 MED ORDER — CYANOCOBALAMIN 1000 MCG/ML IJ SOLN
INTRAMUSCULAR | Status: AC
  Filled 2014-04-24: qty 1

## 2014-04-24 NOTE — Telephone Encounter (Signed)
Per staff message and POF I have scheduled appts. Advised scheduler of appts. JMW  
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
## 2014-04-24 NOTE — [facility] Oncology Navigator Note (Unsigned)
Spoke with patient at new appt with Dr. Julien Nordmann.  Patient requires appt with Rad Onc and Thoracic surgery.  I will arrange. I spoke to patient about the next steps and reason for referrals.    He did state he quit smoking on 04/07/14.  I encouraged him to continue to abstain from smoking.

## 2014-04-24 NOTE — Telephone Encounter (Signed)
Gave avs & calendar for March. Sent message to schedule treatment.

## 2014-04-27 NOTE — Progress Notes (Signed)
Thoracic Location of Tumor / Histology: Left upper lung squamous cell lung cancer  Patient presented per Dr. Julien Nordmann to "his primary care physician Dr. Lenna Gilford for routine physical examination and chest x-ray was performed on 04/03/2014. It showed new masslike opacity in the left suprahilar region suspicious for central left upper lobe bronchogenic carcinoma. This was followed by CT scan of the chest on 04/07/2014 and it showed left upper lobe mass lesion involving the left upper hilum measuring 3.7 x 4.8 cm."  Biopsies revealed:  04/15/14 Diagnosis Endobronchial biopsy, LUL - SQUAMOUS CELL CARCINOMA, SEE DESCRIPTION.  Diagnosis WANG NEEDLE, FINE NEEDLE ASPIRATION, LUL(SPECIMEN 1 OF 3 COLLECTED 04/15/14): SQUAMOUS CELL CARCINOMA.  Diagnosis BRONCHIAL WASHING, LUL(SPECIMEN 2 OF 3 COLLECTED ): SQUAMOUS CELL CARCINOMA.  Diagnosis BRONCHIAL BRUSHING, LUL(SPECIMEN 3 OF 3 COLLECTED 04/15/14): SQUAMOUS CELL CARCINOMA  Tobacco/Marijuana/Snuff/ETOH use: smoked 2 pack per day for 30 years, quit 03/2013, rare ETOH use  Past/Anticipated interventions by cardiothoracic surgery, if any: Per Dr. Julien Nordmann "treatment for concurrent chemoradiation for a period of 5-6 weeks followed by reevaluation and surgical resection if feasible."  Past/Anticipated interventions by medical oncology, if any: concurrent chemoradiation with weekly carboplatin for AUC of 2 and paclitaxel 45 MG/M2 to start 05/04/14.  Signs/Symptoms  Weight changes, if any: no  Respiratory complaints, if any: yes - has a cough with clear/white sputum, short of breath with activity  Hemoptysis, if any: no  Pain issues, if any:  no  SAFETY ISSUES:  Prior radiation? no  Pacemaker/ICD? no   Possible current pregnancy?no  Is the patient on methotrexate? no  Current Complaints / other details:  PET scan done 3/3.  MRI of brain scheduled for 3/10.  He is here with his wife.  He has 2 children and is raising 2 grandchildren.  BP  122/106 mmHg  Pulse 81  Temp(Src) 98.2 F (36.8 C) (Oral)  Resp 20  Ht 5\' 8"  (1.727 m)  Wt 226 lb 1.6 oz (102.558 kg)  BMI 34.39 kg/m2  SpO2 94%

## 2014-04-28 ENCOUNTER — Encounter: Payer: Self-pay | Admitting: Thoracic Surgery (Cardiothoracic Vascular Surgery)

## 2014-04-28 ENCOUNTER — Institutional Professional Consult (permissible substitution) (INDEPENDENT_AMBULATORY_CARE_PROVIDER_SITE_OTHER): Payer: No Typology Code available for payment source | Admitting: Thoracic Surgery (Cardiothoracic Vascular Surgery)

## 2014-04-28 VITALS — BP 128/79 | HR 74 | Resp 18 | Ht 68.0 in | Wt 216.0 lb

## 2014-04-28 DIAGNOSIS — R918 Other nonspecific abnormal finding of lung field: Secondary | ICD-10-CM

## 2014-04-28 DIAGNOSIS — C3412 Malignant neoplasm of upper lobe, left bronchus or lung: Secondary | ICD-10-CM
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
## 2014-04-29 ENCOUNTER — Ambulatory Visit
Admission: RE | Admit: 2014-04-29 | Discharge: 2014-04-29 | Disposition: A | Discharge status: Home / Self Care | Payer: No Typology Code available for payment source | Source: Ambulatory Visit | Attending: Radiation Oncology | Admitting: Radiation Oncology

## 2014-04-29 ENCOUNTER — Encounter: Payer: Self-pay | Admitting: Radiation Oncology

## 2014-04-29 VITALS — BP 122/106 | HR 81 | Temp 98.2°F | Resp 20 | Ht 68.0 in | Wt 226.1 lb

## 2014-04-29 DIAGNOSIS — C3492 Malignant neoplasm of unspecified part of left bronchus or lung: Secondary | ICD-10-CM | POA: Insufficient documentation

## 2014-04-29 DIAGNOSIS — Z87891 Personal history of nicotine dependence: Secondary | ICD-10-CM | POA: Diagnosis not present

## 2014-04-29 HISTORY — DX: Malignant neoplasm of unspecified part of left bronchus or lung: C34.92

## 2014-04-29 NOTE — Progress Notes (Signed)
Please see the Nurse Progress Note in the MD Initial Consult Encounter for this patient. 
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
## 2014-04-30 ENCOUNTER — Ambulatory Visit (HOSPITAL_COMMUNITY)
Admission: RE | Admit: 2014-04-30 | Discharge: 2014-04-30 | Disposition: A | Discharge status: Home / Self Care | Payer: No Typology Code available for payment source | Source: Ambulatory Visit | Attending: Internal Medicine | Admitting: Internal Medicine

## 2014-04-30 ENCOUNTER — Ambulatory Visit
Admission: RE | Admit: 2014-04-30 | Discharge: 2014-04-30 | Disposition: A | Discharge status: Home / Self Care | Payer: No Typology Code available for payment source | Source: Ambulatory Visit | Attending: Radiation Oncology | Admitting: Radiation Oncology

## 2014-04-30 DIAGNOSIS — C3492 Malignant neoplasm of unspecified part of left bronchus or lung: Secondary | ICD-10-CM

## 2014-04-30 DIAGNOSIS — I6782 Cerebral ischemia: Secondary | ICD-10-CM | POA: Diagnosis not present

## 2014-04-30 DIAGNOSIS — C3412 Malignant neoplasm of upper lobe, left bronchus or lung: Secondary | ICD-10-CM | POA: Diagnosis present

## 2014-04-30 MED ORDER — GADOBENATE DIMEGLUMINE 529 MG/ML IV SOLN
20.0000 mL | Freq: Once | INTRAVENOUS | Status: AC | PRN
  Administered 2014-04-30: 20 mL via INTRAVENOUS

## 2014-05-01 ENCOUNTER — Other Ambulatory Visit: Payer: Self-pay | Admitting: Adult Health

## 2014-05-01 ENCOUNTER — Other Ambulatory Visit: Payer: Self-pay | Admitting: *Deleted

## 2014-05-01 ENCOUNTER — Other Ambulatory Visit: Payer: No Typology Code available for payment source

## 2014-05-01 DIAGNOSIS — C3490 Malignant neoplasm of unspecified part of unspecified bronchus or lung: Secondary | ICD-10-CM

## 2014-05-01 DIAGNOSIS — C3492 Malignant neoplasm of unspecified part of left bronchus or lung: Secondary | ICD-10-CM | POA: Diagnosis not present

## 2014-05-01 NOTE — Progress Notes (Signed)
  Radiation Oncology         [phone] ________________________________  Name: Vincent Sutton MRN: [id number]  Date: 04/30/2014  DOB: [birth year]  SIMULATION AND TREATMENT PLANNING NOTE    ICD-9-CM ICD-10-CM   1. 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  Identity was confirmed.  All relevant records and images related to the planned course of therapy were reviewed.  The patient freely provided informed written consent to proceed with treatment after reviewing the details related to the planned course of therapy. The consent form was witnessed and verified by the simulation staff.  Then, the patient was set-up in a stable reproducible  supine position for radiation therapy.  CT images were obtained.  Surface markings were placed.  The CT images were loaded into the planning software.  Then the target and avoidance structures were contoured.  Treatment planning then occurred.  The radiation prescription was entered and confirmed.  Then, I designed and supervised the construction of a total of 7 medically necessary complex treatment devices.  I have requested : 3D Simulation  I have requested a DVH of the following structures: GTV, PTV, heart, lungs, eosphagus.  I have ordered:dose calc.  PLAN:  The patient will receive 45 Gy in 25 fractions along with radiosensitizing chemotherapy in a preoperative fashion.  ________________________________   Special treatment procedure note  The patient will be receiving radiosensitizing chemotherapy throughout his course of chest treatment. Given the increased potential for toxicities as well as the necessity for close monitoring of the patient and bloodwork, this constitutes a special treatment procedure -----------------------------------  Blair Promise, PhD, MD

## 2014-05-04 ENCOUNTER — Ambulatory Visit (HOSPITAL_BASED_OUTPATIENT_CLINIC_OR_DEPARTMENT_OTHER): Payer: No Typology Code available for payment source | Admitting: Nurse Practitioner

## 2014-05-04 ENCOUNTER — Encounter: Payer: Self-pay | Admitting: *Deleted

## 2014-05-04 ENCOUNTER — Ambulatory Visit (HOSPITAL_BASED_OUTPATIENT_CLINIC_OR_DEPARTMENT_OTHER): Payer: No Typology Code available for payment source

## 2014-05-04 ENCOUNTER — Other Ambulatory Visit (HOSPITAL_BASED_OUTPATIENT_CLINIC_OR_DEPARTMENT_OTHER): Payer: No Typology Code available for payment source

## 2014-05-04 DIAGNOSIS — T7840XA Allergy, unspecified, initial encounter: Secondary | ICD-10-CM

## 2014-05-04 DIAGNOSIS — Z5111 Encounter for antineoplastic chemotherapy: Secondary | ICD-10-CM

## 2014-05-04 DIAGNOSIS — C3490 Malignant neoplasm of unspecified part of unspecified bronchus or lung: Secondary | ICD-10-CM

## 2014-05-04 DIAGNOSIS — C3412 Malignant neoplasm of upper lobe, left bronchus or lung: Secondary | ICD-10-CM

## 2014-05-04 DIAGNOSIS — C3492 Malignant neoplasm of unspecified part of left bronchus or lung: Secondary | ICD-10-CM

## 2014-05-04 LAB — CBC WITH DIFFERENTIAL/PLATELET
BASO%: 1.2 % (ref 0.0–2.0)
BASOS ABS: 0.1 10*3/uL (ref 0.0–0.1)
EOS ABS: 0.2 10*3/uL (ref 0.0–0.5)
EOS%: 1.8 % (ref 0.0–7.0)
HCT: 39.8 % (ref 38.4–49.9)
HEMOGLOBIN: 13.3 g/dL (ref 13.0–17.1)
LYMPH%: 19.8 % (ref 14.0–49.0)
MCH: 29.3 pg (ref 27.2–33.4)
MCHC: 33.4 g/dL (ref 32.0–36.0)
MCV: 87.8 fL (ref 79.3–98.0)
MONO#: 1.7 10*3/uL — ABNORMAL HIGH (ref 0.1–0.9)
MONO%: 15.2 % — ABNORMAL HIGH (ref 0.0–14.0)
NEUT%: 62 % (ref 39.0–75.0)
NEUTROS ABS: 7 10*3/uL — AB (ref 1.5–6.5)
Platelets: 541 10*3/uL — ABNORMAL HIGH (ref 140–400)
RBC: 4.53 10*6/uL (ref 4.20–5.82)
RDW: 15.4 % — AB (ref 11.0–14.6)
WBC: 11.3 10*3/uL — ABNORMAL HIGH (ref 4.0–10.3)
lymph#: 2.2 10*3/uL (ref 0.9–3.3)

## 2014-05-04 LAB — COMPREHENSIVE METABOLIC PANEL (CC13)
ALK PHOS: 117 U/L (ref 40–150)
ALT: 22 U/L (ref 0–55)
ANION GAP: 10 meq/L (ref 3–11)
AST: 17 U/L (ref 5–34)
Albumin: 3.6 g/dL (ref 3.5–5.0)
BILIRUBIN TOTAL: 0.22 mg/dL (ref 0.20–1.20)
BUN: 12 mg/dL (ref 7.0–26.0)
CO2: 26 mEq/L (ref 22–29)
Calcium: 9.8 mg/dL (ref 8.4–10.4)
Chloride: 103 mEq/L (ref 98–109)
Creatinine: 0.8 mg/dL (ref 0.7–1.3)
EGFR: >90 mL/min/{1.73_m2} (ref >90)
Glucose: 102 mg/dl (ref 70–140)
Potassium: 4.1 mEq/L (ref 3.5–5.1)
SODIUM: 139 meq/L (ref 136–145)
Total Protein: 8.3 g/dL (ref 6.4–8.3)

## 2014-05-04 MED ORDER — METHYLPREDNISOLONE SODIUM SUCC 125 MG IJ SOLR
125.0000 mg | Freq: Once | INTRAMUSCULAR | Status: AC | PRN
  Administered 2014-05-04: 125 mg via INTRAVENOUS

## 2014-05-04 MED ORDER — SODIUM CHLORIDE 0.9 % IV SOLN
Freq: Once | INTRAVENOUS | Status: AC
  Administered 2014-05-04: 11:00:00 via INTRAVENOUS
  Filled 2014-05-04: qty 8

## 2014-05-04 MED ORDER — DIPHENHYDRAMINE HCL 50 MG/ML IJ SOLN
50.0000 mg | Freq: Once | INTRAMUSCULAR | Status: AC
  Administered 2014-05-04: 50 mg via INTRAVENOUS

## 2014-05-04 MED ORDER — FAMOTIDINE IN NACL 20-0.9 MG/50ML-% IV SOLN
20.0000 mg | Freq: Once | INTRAVENOUS | Status: AC
  Administered 2014-05-04: 20 mg via INTRAVENOUS

## 2014-05-04 MED ORDER — DIPHENHYDRAMINE HCL 50 MG/ML IJ SOLN
INTRAMUSCULAR | Status: AC
  Filled 2014-05-04: qty 1

## 2014-05-04 MED ORDER — DEXTROSE 5 % IV SOLN
45.0000 mg/m2 | Freq: Once | INTRAVENOUS | Status: AC
  Administered 2014-05-04: 102 mg via INTRAVENOUS
  Filled 2014-05-04: qty 17

## 2014-05-04 MED ORDER — FAMOTIDINE IN NACL 20-0.9 MG/50ML-% IV SOLN
INTRAVENOUS | Status: AC
  Filled 2014-05-04: qty 50

## 2014-05-04 MED ORDER — SODIUM CHLORIDE 0.9 % IV SOLN
Freq: Once | INTRAVENOUS | Status: AC
  Administered 2014-05-04: 11:00:00 via INTRAVENOUS

## 2014-05-04 MED ORDER — SODIUM CHLORIDE 0.9 % IV SOLN
300.0000 mg | Freq: Once | INTRAVENOUS | Status: AC
  Administered 2014-05-04: 300 mg via INTRAVENOUS
  Filled 2014-05-04: qty 30

## 2014-05-04 NOTE — Patient Instructions (Addendum)
[facility] Discharge Instructions for Patients Receiving Chemotherapy  Today you received the following chemotherapy agents :  Taxol, Carboplatin.  To help prevent nausea and vomiting after your treatment, we encourage you to take your nausea medication as prescribed.  Take Compazine 10mg  by mouth every 6 hours as needed for nausea.  DO NOT Drive after taking this med as it can cause drowsiness.   DO NOT Eat  Greasy  Nor  Spicy  Foods.   DO Drink  Lots  Of  Fluids  As  Tolerated.   If you develop nausea and vomiting that is not controlled by your nausea medication, call the clinic.   BELOW ARE SYMPTOMS THAT SHOULD BE REPORTED IMMEDIATELY:  *FEVER GREATER THAN 100.5 F  *CHILLS WITH OR WITHOUT FEVER  NAUSEA AND VOMITING THAT IS NOT CONTROLLED WITH YOUR NAUSEA MEDICATION  *UNUSUAL SHORTNESS OF BREATH  *UNUSUAL BRUISING OR BLEEDING  TENDERNESS IN MOUTH AND THROAT WITH OR WITHOUT PRESENCE OF ULCERS  *URINARY PROBLEMS  *BOWEL PROBLEMS  UNUSUAL RASH Items with * indicate a potential emergency and should be followed up as soon as possible.  Feel free to call the clinic you have any questions or concerns. The clinic phone number is [phone].

## 2014-05-04 NOTE — [facility] Oncology Navigator Note (Unsigned)
Helped patient complete gas card application.  Gave to Lauren to scan and send to lung cancer initiative program.

## 2014-05-04 NOTE — Progress Notes (Signed)
First time Taxol hung slowly as per protocol -  Rate 75 ml/hr  X 19 ml at 1145 am.  VSS prior to Taxol started.  VSS at 1150.  At 12 pm, pt c/o nauseated, facial very red.  Taxol stopped at 12 pm.  Solumedrol 125 mg IVP per hypersensitivity protocol.  Retta Mac, NP symptom management came to see pt in infusion room.  O2 at 2L [place] applied.  Pt is monitored very closely with normal saline infusing.  Wife at chair side. 1230 -  Per Jenny Reichmann, NP -  Dr. Julien Nordmann ordered for Carboplatin to be given today.  NO More Taxol today.

## 2014-05-05 ENCOUNTER — Encounter: Payer: No Typology Code available for payment source | Admitting: Thoracic Surgery (Cardiothoracic Vascular Surgery)

## 2014-05-05 ENCOUNTER — Encounter: Payer: Self-pay | Admitting: Nurse Practitioner

## 2014-05-05 ENCOUNTER — Ambulatory Visit
Admission: RE | Admit: 2014-05-05 | Discharge: 2014-05-05 | Disposition: A | Discharge status: Home / Self Care | Payer: No Typology Code available for payment source | Source: Ambulatory Visit | Attending: Radiation Oncology | Admitting: Radiation Oncology

## 2014-05-05 ENCOUNTER — Telehealth: Payer: Self-pay | Admitting: *Deleted

## 2014-05-05 ENCOUNTER — Other Ambulatory Visit: Payer: Self-pay | Admitting: Internal Medicine

## 2014-05-05 ENCOUNTER — Encounter: Payer: Self-pay | Admitting: Radiation Oncology

## 2014-05-05 ENCOUNTER — Telehealth: Payer: Self-pay | Admitting: Medical Oncology

## 2014-05-05 VITALS — BP 139/79 | HR 89 | Temp 97.5°F | Resp 16 | Ht 68.0 in | Wt 233.7 lb

## 2014-05-05 DIAGNOSIS — T7840XA Allergy, unspecified, initial encounter: Secondary | ICD-10-CM | POA: Insufficient documentation

## 2014-05-05 DIAGNOSIS — C3492 Malignant neoplasm of unspecified part of left bronchus or lung: Secondary | ICD-10-CM | POA: Diagnosis not present

## 2014-05-05 NOTE — Progress Notes (Signed)
Weekly Management Note Current Dose: 1.8  Gy  Projected Dose: 45 Gy   Narrative:  The patient presents for routine under treatment assessment.  CBCT/MVCT images/Port film x-rays were reviewed.  The chart was checked. Doing well. FEeling better after yesterday. Some nausea this am but worked all day.   Physical Findings: Weight: 233 lb 11.2 oz (106.006 kg). Unchanged  Impression:  The patient is tolerating radiation.  Plan:  Continue treatment as planned. RN education tomorrow.

## 2014-05-05 NOTE — Assessment & Plan Note (Signed)
Patient presented to the [facility] today for cycle 1 of his weekly carboplatin/Taxol chemotherapy regimen.  He had received approximately 15 minutes of the Taxol infusion; and developed a hypersensitivity reaction which consisted of facial flushing, increase shortness of breath, and nausea.  Confirmed the patient did receive both Benadryl 50 mg as well as dexamethasone prior to administration of the Taxol as premedications.  Taxol infusion was held immediately; and patient was given Solu-Medrol 125 mg IV.  O2 was also placed via nasal cannula.  Vital signs remained fairly stable throughout.  All symptoms did resolve; and patient returned to baseline.  Due to the significant reaction symptoms to the Taxol-will discontinue any further Taxol chemotherapy.  Patient was able to proceed with the carboplatin infusion today with no further difficulties.  Patient has plans to return next week on 05/11/2014 for his next cycle of chemotherapy.  Advised both patient and his wife Dr. Earlie Server would be reviewing a new chemotherapy plan; since patient unable to tolerate Taxol.  Also, patient will continue with his radiation treatments on a daily basis.  His final radiation treatment is scheduled for 06/08/2014.

## 2014-05-05 NOTE — Telephone Encounter (Signed)
I left voice mail for wife regarding chemo followup and to have [facility] call.

## 2014-05-05 NOTE — Telephone Encounter (Signed)
Received call from wife regarding pt not receiving breathing tx yesterday after reaction. Per Selena Lesser NP, if pt does not need tx, no need to receive one from [place] today, pt may use inhaler or use home breathing tx. No answer, left voicemail with return callback number.

## 2014-05-05 NOTE — Progress Notes (Signed)
Vincent Sutton has completed 1 fraction to his left chest.  He denies pain, shortness of breath and coughing.  He had his first chemotherapy treatment yesterday and had a reaction to Taxol.  He is feeling ok today.  He had once episode of nausea/vomiting this morning.  He is taking compazine.  His face appears red and flushed.  BP 139/79 mmHg  Pulse 89  Temp(Src) 97.5 F (36.4 C) (Oral)  Resp 16  Ht 5\' 8"  (1.727 m)  Wt 233 lb 11.2 oz (106.006 kg)  BMI 35.54 kg/m2  SpO2 96%
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
## 2014-05-05 NOTE — Assessment & Plan Note (Signed)
Patient presented to the [facility] today for cycle 1 of his weekly carboplatin/Taxol chemotherapy regimen.  He had received approximately 15 minutes of the Taxol infusion; and developed a hypersensitivity reaction which consisted of facial flushing, increase shortness of breath, and nausea.  Confirmed the patient did receive both Benadryl 50 mg as well as dexamethasone prior to administration of the Taxol as premedications.  Taxol infusion was held immediately; and patient was given Solu-Medrol 125 mg IV.  O2 was also placed via nasal cannula.  Vital signs remained fairly stable throughout.  Due to the significant reaction symptoms to the Taxol-will discontinue any further Taxol chemotherapy.  All symptoms did essentially resolve; and Patient was able to proceed with the carboplatin infusion today with no further difficulties.

## 2014-05-06 ENCOUNTER — Ambulatory Visit
Admission: RE | Admit: 2014-05-06 | Discharge: 2014-05-06 | Disposition: A | Discharge status: Home / Self Care | Payer: No Typology Code available for payment source | Source: Ambulatory Visit | Attending: Radiation Oncology | Admitting: Radiation Oncology

## 2014-05-06 DIAGNOSIS — C3492 Malignant neoplasm of unspecified part of left bronchus or lung: Secondary | ICD-10-CM

## 2014-05-06 MED ORDER — RADIAPLEXRX EX GEL
Freq: Once | CUTANEOUS | Status: AC
  Administered 2014-05-06: 12:00:00 via TOPICAL

## 2014-05-06 NOTE — Progress Notes (Signed)
Pt here for patient teaching.  Radiation and You booklet given with areas of pertinence flagged and highlighted.  Reviewed fatigue, skin changes, throat changes and cough . Pt able to give teach back of to pat skin and use unscented/gentle soap,apply Radiaplex bid and avoid applying anything to skin within 4 hours of treatment. Pt demonstrated understanding and verbalizes understanding of information given and will contact nursing with any questions or concerns.

## 2014-05-07 ENCOUNTER — Ambulatory Visit
Admission: RE | Admit: 2014-05-07 | Discharge: 2014-05-07 | Disposition: A | Discharge status: Home / Self Care | Payer: No Typology Code available for payment source | Source: Ambulatory Visit | Attending: Radiation Oncology | Admitting: Radiation Oncology

## 2014-05-07 DIAGNOSIS — C3492 Malignant neoplasm of unspecified part of left bronchus or lung: Secondary | ICD-10-CM | POA: Diagnosis not present

## 2014-05-08 ENCOUNTER — Telehealth: Payer: Self-pay | Admitting: *Deleted

## 2014-05-08 ENCOUNTER — Other Ambulatory Visit: Payer: Self-pay | Admitting: Medical Oncology

## 2014-05-08 ENCOUNTER — Ambulatory Visit
Admission: RE | Admit: 2014-05-08 | Discharge: 2014-05-08 | Disposition: A | Discharge status: Home / Self Care | Payer: No Typology Code available for payment source | Source: Ambulatory Visit | Attending: Radiation Oncology | Admitting: Radiation Oncology

## 2014-05-08 DIAGNOSIS — C3490 Malignant neoplasm of unspecified part of unspecified bronchus or lung: Secondary | ICD-10-CM

## 2014-05-08 DIAGNOSIS — C3492 Malignant neoplasm of unspecified part of left bronchus or lung: Secondary | ICD-10-CM | POA: Diagnosis not present

## 2014-05-08 NOTE — Telephone Encounter (Signed)
Patient's wife called.  She left me a vm message to call.  I called back and left a vm message to call the cancer center at [phone].

## 2014-05-08 NOTE — Telephone Encounter (Signed)
Spouse called reporting Navigator Norton Blizzard  Instructed them to call for symptom management.  "Received first carboplatin, taxol 05-04-2014.  Bad reaction within 5 minutes of taxol and only received carboplatin.  Having a lot of fatigue.  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  [place] stabbing pain to left lung area and his guts/intestines hurt too.  His job does not have short term disability.  A small company of less than eighteen people.  We have no income or insurance if he does not work.  Now wondering if he needs to file for disability."    Denies use of tylenol or ibuprofen and was told not to use either of these.  Eating and drinking well.  Is not sleeping.  Admits to pain in feet.  Regular, daily bowel movements everytime I eat.   Asked if they want to come in to Symptom management Clinic and report they are to call radiation to have RT earlier today.  Suggested they talk with doctor in RT or see [facility].  Will call back to determine schedule.      [id number] called back spoke with [name].  Appointment with RT is moved up to 4:00 pm.  Asked what time he wants to come in for [facility].  "I'll be all right.  I was fine initially and then it all caught up with me."  Advised to try heat or cold for pain and says he was also told not to use these.  Discussed nature of his job he needs to rest and not do excessive walking.  Discussed Hand foot syndrome precautions.  He may ask to see provider in radiation at 4:00 today.

## 2014-05-11 ENCOUNTER — Ambulatory Visit (HOSPITAL_BASED_OUTPATIENT_CLINIC_OR_DEPARTMENT_OTHER): Payer: No Typology Code available for payment source | Admitting: Oncology

## 2014-05-11 ENCOUNTER — Telehealth: Payer: Self-pay | Admitting: Internal Medicine

## 2014-05-11 ENCOUNTER — Other Ambulatory Visit (HOSPITAL_BASED_OUTPATIENT_CLINIC_OR_DEPARTMENT_OTHER): Payer: No Typology Code available for payment source

## 2014-05-11 ENCOUNTER — Encounter: Payer: Self-pay | Admitting: Oncology

## 2014-05-11 ENCOUNTER — Ambulatory Visit (HOSPITAL_BASED_OUTPATIENT_CLINIC_OR_DEPARTMENT_OTHER): Payer: No Typology Code available for payment source

## 2014-05-11 ENCOUNTER — Ambulatory Visit
Admission: RE | Admit: 2014-05-11 | Discharge: 2014-05-11 | Disposition: A | Discharge status: Home / Self Care | Payer: No Typology Code available for payment source | Source: Ambulatory Visit | Attending: Radiation Oncology | Admitting: Radiation Oncology

## 2014-05-11 VITALS — BP 130/72 | HR 90 | Temp 98.5°F | Resp 21 | Ht 68.0 in | Wt 231.5 lb

## 2014-05-11 DIAGNOSIS — C3412 Malignant neoplasm of upper lobe, left bronchus or lung: Secondary | ICD-10-CM

## 2014-05-11 DIAGNOSIS — C3492 Malignant neoplasm of unspecified part of left bronchus or lung: Secondary | ICD-10-CM | POA: Diagnosis not present

## 2014-05-11 DIAGNOSIS — C3490 Malignant neoplasm of unspecified part of unspecified bronchus or lung: Secondary | ICD-10-CM

## 2014-05-11 DIAGNOSIS — Z5111 Encounter for antineoplastic chemotherapy: Secondary | ICD-10-CM

## 2014-05-11 LAB — COMPREHENSIVE METABOLIC PANEL (CC13)
ALT: 27 U/L (ref 0–55)
AST: 17 U/L (ref 5–34)
Albumin: 3.5 g/dL (ref 3.5–5.0)
Alkaline Phosphatase: 117 U/L (ref 40–150)
Anion Gap: 8 mEq/L (ref 3–11)
BILIRUBIN TOTAL: 0.23 mg/dL (ref 0.20–1.20)
BUN: 14.1 mg/dL (ref 7.0–26.0)
CHLORIDE: 102 meq/L (ref 98–109)
CO2: 24 mEq/L (ref 22–29)
CREATININE: 0.8 mg/dL (ref 0.7–1.3)
Calcium: 9.6 mg/dL (ref 8.4–10.4)
EGFR: >90 mL/min/{1.73_m2} (ref >90)
Glucose: 114 mg/dl (ref 70–140)
Potassium: 4.5 mEq/L (ref 3.5–5.1)
Sodium: 134 mEq/L — ABNORMAL LOW (ref 136–145)
Total Protein: 8.1 g/dL (ref 6.4–8.3)

## 2014-05-11 LAB — CBC & DIFF AND RETIC
BASO%: 0.3 % (ref 0.0–2.0)
BASOS ABS: 0 10*3/uL (ref 0.0–0.1)
EOS ABS: 0.2 10*3/uL (ref 0.0–0.5)
EOS%: 2.3 % (ref 0.0–7.0)
HCT: 40.8 % (ref 38.4–49.9)
HEMOGLOBIN: 13.5 g/dL (ref 13.0–17.1)
Immature Retic Fract: 2.6 % — ABNORMAL LOW (ref 3.00–10.60)
LYMPH%: 18.8 % (ref 14.0–49.0)
MCH: 29.9 pg (ref 27.2–33.4)
MCHC: 33.1 g/dL (ref 32.0–36.0)
MCV: 90.5 fL (ref 79.3–98.0)
MONO#: 1.3 10*3/uL — ABNORMAL HIGH (ref 0.1–0.9)
MONO%: 13 % (ref 0.0–14.0)
NEUT%: 65.6 % (ref 39.0–75.0)
NEUTROS ABS: 6.5 10*3/uL (ref 1.5–6.5)
Platelets: 399 10*3/uL (ref 140–400)
RBC: 4.51 10*6/uL (ref 4.20–5.82)
RDW: 15.5 % — ABNORMAL HIGH (ref 11.0–14.6)
RETIC CT ABS: 59.98 10*3/uL (ref 34.80–93.90)
Retic %: 1.33 % (ref 0.80–1.80)
WBC: 9.9 10*3/uL (ref 4.0–10.3)
lymph#: 1.9 10*3/uL (ref 0.9–3.3)

## 2014-05-11 MED ORDER — PACLITAXEL PROTEIN-BOUND CHEMO INJECTION 100 MG
100.0000 mg/m2 | Freq: Once | INTRAVENOUS | Status: AC
  Administered 2014-05-11: 225 mg via INTRAVENOUS
  Filled 2014-05-11: qty 45

## 2014-05-11 MED ORDER — SODIUM CHLORIDE 0.9 % IV SOLN
300.0000 mg | Freq: Once | INTRAVENOUS | Status: AC
  Administered 2014-05-11: 300 mg via INTRAVENOUS
  Filled 2014-05-11: qty 30

## 2014-05-11 MED ORDER — SODIUM CHLORIDE 0.9 % IV SOLN
Freq: Once | INTRAVENOUS | Status: AC
  Administered 2014-05-11: 11:00:00 via INTRAVENOUS
  Filled 2014-05-11: qty 8

## 2014-05-11 MED ORDER — SODIUM CHLORIDE 0.9 % IV SOLN
Freq: Once | INTRAVENOUS | Status: AC
  Administered 2014-05-11: 11:00:00 via INTRAVENOUS

## 2014-05-11 NOTE — Telephone Encounter (Signed)
added appt....patient will get sched from tx.

## 2014-05-11 NOTE — Patient Instructions (Signed)
[facility] Discharge Instructions for Patients Receiving Chemotherapy  Today you received the following chemotherapy agents: Abraxane and carbo platin.  To help prevent nausea and vomiting after your treatment, we encourage you to take your nausea medication: Compazine 10 mg every 6 hours.   If you develop nausea and vomiting that is not controlled by your nausea medication, call the clinic.   BELOW ARE SYMPTOMS THAT SHOULD BE REPORTED IMMEDIATELY:  *FEVER GREATER THAN 100.5 F  *CHILLS WITH OR WITHOUT FEVER  NAUSEA AND VOMITING THAT IS NOT CONTROLLED WITH YOUR NAUSEA MEDICATION  *UNUSUAL SHORTNESS OF BREATH  *UNUSUAL BRUISING OR BLEEDING  TENDERNESS IN MOUTH AND THROAT WITH OR WITHOUT PRESENCE OF ULCERS  *URINARY PROBLEMS  *BOWEL PROBLEMS  UNUSUAL RASH Items with * indicate a potential emergency and should be followed up as soon as possible.  Feel free to call the clinic you have any questions or concerns. The clinic phone number is [phone].  Please show the [facility] at check-in to the Emergency Department and triage nurse.
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
## 2014-05-12 ENCOUNTER — Ambulatory Visit
Admission: RE | Admit: 2014-05-12 | Discharge: 2014-05-12 | Disposition: A | Discharge status: Home / Self Care | Payer: No Typology Code available for payment source | Source: Ambulatory Visit | Attending: Radiation Oncology | Admitting: Radiation Oncology

## 2014-05-12 ENCOUNTER — Encounter: Payer: Self-pay | Admitting: Radiation Oncology

## 2014-05-12 VITALS — BP 146/84 | HR 89 | Temp 98.0°F | Resp 18 | Wt 229.0 lb

## 2014-05-12 DIAGNOSIS — C3492 Malignant neoplasm of unspecified part of left bronchus or lung: Secondary | ICD-10-CM | POA: Diagnosis not present

## 2014-05-12 NOTE — Progress Notes (Signed)
Patient states, "I am breathing better." Reports not having to use inhalers as frequently. Reports one episode last week where he coughed up scant bright red blood but,denies any similar episodes. Denies having a sore throat or difficulty swallowing. Reports sore in his mouth resolved with biotene. SOB with mild exertion noted. Denies frequent cough. Denies skin changes to chest or back. Reports using radiaplex bid as directed.

## 2014-05-12 NOTE — Progress Notes (Signed)
  Radiation Oncology         [phone] ________________________________  Name: Vincent Sutton MRN: [id number]  Date: 05/12/2014  DOB: 1960/04/21  DIAGNOSIS: Stage IIA (T2b, N0, M0) non-small cell lung cancer ( squamous cell carcinoma)   Current Dose: 10.8 Gy     Planned Dose:  45 Gy  Narrative . . . . . . . . The patient presents for routine under treatment assessment.                                   The patient is without complaint. He is actually feeling well at this time,  the best he has in 2-3 weeks. He denies any swallowing difficulties. His breathing is good at this time.                                 Set-up films were reviewed.                                 The chart was checked. Physical Findings. . .  weight is 229 lb (103.874 kg). His oral temperature is 98 F (36.7 C). His blood pressure is 146/84 and his pulse is 89. His respiration is 18 and oxygen saturation is 96%. . The lungs are clear. The heart has a regular rhythm and rate. Impression . . . . . . . The patient is tolerating radiation. Plan . . . . . . . . . . . . Continue treatment as planned.  ________________________________   Blair Promise, PhD, MD

## 2014-05-13 ENCOUNTER — Ambulatory Visit
Admission: RE | Admit: 2014-05-13 | Discharge: 2014-05-13 | Disposition: A | Discharge status: Home / Self Care | Payer: No Typology Code available for payment source | Source: Ambulatory Visit | Attending: Radiation Oncology | Admitting: Radiation Oncology

## 2014-05-13 DIAGNOSIS — C3492 Malignant neoplasm of unspecified part of left bronchus or lung: Secondary | ICD-10-CM | POA: Diagnosis not present
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
## 2014-05-14 ENCOUNTER — Encounter: Payer: Self-pay | Admitting: Pulmonary Disease

## 2014-05-14 ENCOUNTER — Ambulatory Visit
Admission: RE | Admit: 2014-05-14 | Discharge: 2014-05-14 | Disposition: A | Discharge status: Home / Self Care | Payer: No Typology Code available for payment source | Source: Ambulatory Visit | Attending: Radiation Oncology | Admitting: Radiation Oncology

## 2014-05-14 DIAGNOSIS — C3492 Malignant neoplasm of unspecified part of left bronchus or lung: Secondary | ICD-10-CM | POA: Diagnosis not present

## 2014-05-14 LAB — FUNGUS CULTURE W SMEAR
FUNGAL SMEAR: NONE SEEN
Special Requests: NORMAL

## 2014-05-15 ENCOUNTER — Other Ambulatory Visit: Payer: Self-pay | Admitting: *Deleted

## 2014-05-15 ENCOUNTER — Encounter: Payer: Self-pay | Admitting: *Deleted

## 2014-05-15 ENCOUNTER — Ambulatory Visit
Admission: RE | Admit: 2014-05-15 | Discharge: 2014-05-15 | Disposition: A | Discharge status: Home / Self Care | Payer: No Typology Code available for payment source | Source: Ambulatory Visit | Attending: Radiation Oncology | Admitting: Radiation Oncology

## 2014-05-15 DIAGNOSIS — C3490 Malignant neoplasm of unspecified part of unspecified bronchus or lung: Secondary | ICD-10-CM

## 2014-05-15 DIAGNOSIS — C3492 Malignant neoplasm of unspecified part of left bronchus or lung: Secondary | ICD-10-CM | POA: Diagnosis not present

## 2014-05-15 NOTE — Progress Notes (Signed)
[facility] Psychosocial Distress Screening Clinical Social Work  Clinical Social Work was referred by distress screening protocol.  The patient scored a 8 on the Psychosocial Distress Thermometer which indicates severe distress. Clinical Social Worker contacted patient by phone to assess for distress and other psychosocial needs. CSW spoke by phone with patient and patient's spouse.  CSW discussed patient's concerns regarding insurance paying for radiation treatment (CSW directed them to radiation managed care), working through treatment (CSW discussed FMLA benefits, patient does not have short term disability), and how patient's grandchildren are coping with changes (referred to [facility]).  Mr. Panebianco and spouse shared they are feeling hopeful based on patient's early diagnosis and prognosis.  He stated he he sometimes feels "down" due to dealing with cancer, but does not feel this is a concern at this time.  CSW encouraged patient/family to contact CSW in the future as needs arise.  ONCBCN DISTRESS SCREENING 04/29/2014  Screening Type Initial Screening  Distress experienced in past week (1-10) 8  Practical problem type Insurance;Work/school  Family Problem type Other (comment)  Emotional problem type Depression;Nervousness/Anxiety;Adjusting to illness;Isolation/feeling alone;Boredom  Spiritual/Religous concerns type Facing my mortality;Loss of sense of purpose  Information Concerns Type Lack of info about complementary therapy choices  Physical Problem type Pain;Sleep/insomnia;Breathing;Skin dry/itchy  Physician notified of physical symptoms Yes    Clinical Social Worker follow up needed: No.  If yes, follow up plan:  Polo Riley, MSW, LCSW, OSW-C Clinical Social Worker [place] [phone]

## 2014-05-18 ENCOUNTER — Ambulatory Visit
Admission: RE | Admit: 2014-05-18 | Discharge: 2014-05-18 | Disposition: A | Discharge status: Home / Self Care | Payer: No Typology Code available for payment source | Source: Ambulatory Visit | Attending: Radiation Oncology | Admitting: Radiation Oncology

## 2014-05-18 ENCOUNTER — Other Ambulatory Visit (HOSPITAL_BASED_OUTPATIENT_CLINIC_OR_DEPARTMENT_OTHER): Payer: No Typology Code available for payment source

## 2014-05-18 ENCOUNTER — Ambulatory Visit (HOSPITAL_BASED_OUTPATIENT_CLINIC_OR_DEPARTMENT_OTHER): Payer: No Typology Code available for payment source

## 2014-05-18 DIAGNOSIS — C3492 Malignant neoplasm of unspecified part of left bronchus or lung: Secondary | ICD-10-CM

## 2014-05-18 DIAGNOSIS — C3490 Malignant neoplasm of unspecified part of unspecified bronchus or lung: Secondary | ICD-10-CM

## 2014-05-18 DIAGNOSIS — Z5111 Encounter for antineoplastic chemotherapy: Secondary | ICD-10-CM

## 2014-05-18 LAB — COMPREHENSIVE METABOLIC PANEL (CC13)
ALK PHOS: 107 U/L (ref 40–150)
ALT: 29 U/L (ref 0–55)
AST: 17 U/L (ref 5–34)
Albumin: 3.6 g/dL (ref 3.5–5.0)
Anion Gap: 11 mEq/L (ref 3–11)
BUN: 10.9 mg/dL (ref 7.0–26.0)
CO2: 25 mEq/L (ref 22–29)
CREATININE: 0.8 mg/dL (ref 0.7–1.3)
Calcium: 9.5 mg/dL (ref 8.4–10.4)
Chloride: 104 mEq/L (ref 98–109)
EGFR: >90 mL/min/{1.73_m2} (ref >90)
Glucose: 106 mg/dl (ref 70–140)
Potassium: 4.4 mEq/L (ref 3.5–5.1)
Sodium: 139 mEq/L (ref 136–145)
Total Bilirubin: 0.26 mg/dL (ref 0.20–1.20)
Total Protein: 7.9 g/dL (ref 6.4–8.3)

## 2014-05-18 LAB — CBC WITH DIFFERENTIAL/PLATELET
BASO%: 0.4 % (ref 0.0–2.0)
BASOS ABS: 0 10*3/uL (ref 0.0–0.1)
EOS ABS: 0.2 10*3/uL (ref 0.0–0.5)
EOS%: 2.4 % (ref 0.0–7.0)
HCT: 39.6 % (ref 38.4–49.9)
HGB: 13.1 g/dL (ref 13.0–17.1)
LYMPH%: 15.4 % (ref 14.0–49.0)
MCH: 29.7 pg (ref 27.2–33.4)
MCHC: 33.1 g/dL (ref 32.0–36.0)
MCV: 89.8 fL (ref 79.3–98.0)
MONO#: 0.7 10*3/uL (ref 0.1–0.9)
MONO%: 9 % (ref 0.0–14.0)
NEUT%: 72.8 % (ref 39.0–75.0)
NEUTROS ABS: 5.4 10*3/uL (ref 1.5–6.5)
PLATELETS: 328 10*3/uL (ref 140–400)
RBC: 4.41 10*6/uL (ref 4.20–5.82)
RDW: 15.3 % — ABNORMAL HIGH (ref 11.0–14.6)
WBC: 7.4 10*3/uL (ref 4.0–10.3)
lymph#: 1.1 10*3/uL (ref 0.9–3.3)

## 2014-05-18 MED ORDER — PACLITAXEL PROTEIN-BOUND CHEMO INJECTION 100 MG
100.0000 mg/m2 | Freq: Once | INTRAVENOUS | Status: AC
  Administered 2014-05-18: 225 mg via INTRAVENOUS
  Filled 2014-05-18: qty 45

## 2014-05-18 MED ORDER — CARBOPLATIN CHEMO INJECTION 450 MG/45ML
300.0000 mg | Freq: Once | INTRAVENOUS | Status: AC
  Administered 2014-05-18: 300 mg via INTRAVENOUS
  Filled 2014-05-18: qty 30

## 2014-05-18 MED ORDER — SODIUM CHLORIDE 0.9 % IV SOLN
Freq: Once | INTRAVENOUS | Status: AC
  Administered 2014-05-18: 10:00:00 via INTRAVENOUS

## 2014-05-18 MED ORDER — SODIUM CHLORIDE 0.9 % IV SOLN
Freq: Once | INTRAVENOUS | Status: AC
  Administered 2014-05-18: 10:00:00 via INTRAVENOUS
  Filled 2014-05-18: qty 8

## 2014-05-18 NOTE — Patient Instructions (Signed)
[facility] Discharge Instructions for Patients Receiving Chemotherapy  Today you received the following chemotherapy agents :  Taxol, Carboplatin.  To help prevent nausea and vomiting after your treatment, we encourage you to take your nausea medication as prescribed.  Take Compazine 10mg  by mouth every 6 hours as needed for nausea.  DO NOT Drive after taking this med as it can cause drowsiness.   DO NOT Eat  Greasy  Nor  Spicy  Foods.   DO Drink  Lots  Of  Fluids  As  Tolerated.   If you develop nausea and vomiting that is not controlled by your nausea medication, call the clinic.   BELOW ARE SYMPTOMS THAT SHOULD BE REPORTED IMMEDIATELY:  *FEVER GREATER THAN 100.5 F  *CHILLS WITH OR WITHOUT FEVER  NAUSEA AND VOMITING THAT IS NOT CONTROLLED WITH YOUR NAUSEA MEDICATION  *UNUSUAL SHORTNESS OF BREATH  *UNUSUAL BRUISING OR BLEEDING  TENDERNESS IN MOUTH AND THROAT WITH OR WITHOUT PRESENCE OF ULCERS  *URINARY PROBLEMS  *BOWEL PROBLEMS  UNUSUAL RASH Items with * indicate a potential emergency and should be followed up as soon as possible.  Feel free to call the clinic you have any questions or concerns. The clinic phone number is [phone].

## 2014-05-19 ENCOUNTER — Ambulatory Visit
Admission: RE | Admit: 2014-05-19 | Discharge: 2014-05-19 | Disposition: A | Discharge status: Home / Self Care | Payer: No Typology Code available for payment source | Source: Ambulatory Visit | Attending: Radiation Oncology | Admitting: Radiation Oncology

## 2014-05-19 ENCOUNTER — Encounter: Payer: Self-pay | Admitting: Radiation Oncology

## 2014-05-19 VITALS — BP 154/84 | HR 101 | Temp 97.8°F | Resp 20 | Ht 68.0 in | Wt 231.2 lb

## 2014-05-19 DIAGNOSIS — C3492 Malignant neoplasm of unspecified part of left bronchus or lung: Secondary | ICD-10-CM

## 2014-05-19 NOTE — Progress Notes (Signed)
  Radiation Oncology         [phone] ________________________________  Name: Vincent Sutton MRN: [id number]  Date: 05/19/2014  DOB: 23-Jul-1960  Weekly Radiation Therapy Management  DIAGNOSIS: Stage IIA (T2b, N0, M0) non-small cell lung cancer ( squamous cell carcinoma)  Current Dose: 19.8 Gy     Planned Dose:  45 + Gy  Narrative . . . . . . . . The patient presents for routine under treatment assessment.                                   The patient reports improved breathing since starting treatment. He denies any swallowing problems.                                 Set-up films were reviewed.                                 The chart was checked. Physical Findings. . .  height is 5\' 8"  (1.727 m) and weight is 231 lb 3.2 oz (104.872 kg). His oral temperature is 97.8 F (36.6 C). His blood pressure is 154/84 and his pulse is 101. His respiration is 20 and oxygen saturation is 97%. . The lungs are clear. The heart has a regular rhythm and rate. The abdomen is soft and nontender with normal bowel sounds. Impression . . . . . . . The patient is tolerating radiation. Plan . . . . . . . . . . . . Continue treatment as planned.  ________________________________   Blair Promise, PhD, MD

## 2014-05-19 NOTE — Progress Notes (Addendum)
Chesky Heyer has completed 11 fractions to his left chest.  He denies pain.  He had chemotherapy yesterday.  He reports coughing up "chunks that look like hamburger meat" Sunday night.  He reports having a small amount of hemopytsis after sneezing after lunch today.  He reports his breathing is better.  He is able to walk from the parking lot without getting short of breath.  The skin on his upper chest and upper back is red.  He is using radiaplex gel.  He denies trouble swallowing except after chemo when his mouth is dry.  He denies fatigue.  BP 154/84 mmHg  Pulse 101  Temp(Src) 97.8 F (36.6 C) (Oral)  Resp 20  Ht 5\' 8"  (1.727 m)  Wt 231 lb 3.2 oz (104.872 kg)  BMI 35.16 kg/m2  SpO2 97%

## 2014-05-20 ENCOUNTER — Ambulatory Visit
Admission: RE | Admit: 2014-05-20 | Discharge: 2014-05-20 | Disposition: A | Discharge status: Home / Self Care | Payer: No Typology Code available for payment source | Source: Ambulatory Visit | Attending: Radiation Oncology | Admitting: Radiation Oncology

## 2014-05-20 DIAGNOSIS — C3492 Malignant neoplasm of unspecified part of left bronchus or lung: Secondary | ICD-10-CM | POA: Diagnosis not present

## 2014-05-21 ENCOUNTER — Ambulatory Visit
Admission: RE | Admit: 2014-05-21 | Discharge: 2014-05-21 | Disposition: A | Discharge status: Home / Self Care | Payer: No Typology Code available for payment source | Source: Ambulatory Visit | Attending: Radiation Oncology | Admitting: Radiation Oncology

## 2014-05-21 ENCOUNTER — Other Ambulatory Visit: Payer: Self-pay | Admitting: Medical Oncology

## 2014-05-21 DIAGNOSIS — C3490 Malignant neoplasm of unspecified part of unspecified bronchus or lung: Secondary | ICD-10-CM

## 2014-05-21 DIAGNOSIS — C3492 Malignant neoplasm of unspecified part of left bronchus or lung: Secondary | ICD-10-CM | POA: Diagnosis not present

## 2014-05-21 MED ORDER — RADIAPLEXRX EX GEL
Freq: Once | CUTANEOUS | Status: AC
  Administered 2014-05-21: 16:00:00 via TOPICAL

## 2014-05-22 ENCOUNTER — Ambulatory Visit
Admission: RE | Admit: 2014-05-22 | Discharge: 2014-05-22 | Disposition: A | Discharge status: Home / Self Care | Payer: No Typology Code available for payment source | Source: Ambulatory Visit | Attending: Radiation Oncology | Admitting: Radiation Oncology

## 2014-05-22 DIAGNOSIS — C3492 Malignant neoplasm of unspecified part of left bronchus or lung: Secondary | ICD-10-CM | POA: Diagnosis not present

## 2014-05-25 ENCOUNTER — Encounter: Payer: Self-pay | Admitting: Physician Assistant

## 2014-05-25 ENCOUNTER — Ambulatory Visit (HOSPITAL_BASED_OUTPATIENT_CLINIC_OR_DEPARTMENT_OTHER): Payer: No Typology Code available for payment source | Admitting: Physician Assistant

## 2014-05-25 ENCOUNTER — Ambulatory Visit: Payer: No Typology Code available for payment source

## 2014-05-25 ENCOUNTER — Other Ambulatory Visit (HOSPITAL_BASED_OUTPATIENT_CLINIC_OR_DEPARTMENT_OTHER): Payer: No Typology Code available for payment source

## 2014-05-25 ENCOUNTER — Ambulatory Visit (HOSPITAL_BASED_OUTPATIENT_CLINIC_OR_DEPARTMENT_OTHER): Payer: No Typology Code available for payment source

## 2014-05-25 ENCOUNTER — Ambulatory Visit
Admission: RE | Admit: 2014-05-25 | Discharge: 2014-05-25 | Disposition: A | Discharge status: Home / Self Care | Payer: No Typology Code available for payment source | Source: Ambulatory Visit | Attending: Radiation Oncology | Admitting: Radiation Oncology

## 2014-05-25 VITALS — BP 135/68 | HR 90 | Temp 98.2°F | Resp 19 | Ht 68.0 in | Wt 231.4 lb

## 2014-05-25 DIAGNOSIS — K1230 Oral mucositis (ulcerative), unspecified: Secondary | ICD-10-CM

## 2014-05-25 DIAGNOSIS — G893 Neoplasm related pain (acute) (chronic): Secondary | ICD-10-CM

## 2014-05-25 DIAGNOSIS — C3412 Malignant neoplasm of upper lobe, left bronchus or lung: Secondary | ICD-10-CM

## 2014-05-25 DIAGNOSIS — C3490 Malignant neoplasm of unspecified part of unspecified bronchus or lung: Secondary | ICD-10-CM

## 2014-05-25 DIAGNOSIS — Z5111 Encounter for antineoplastic chemotherapy: Secondary | ICD-10-CM | POA: Diagnosis not present

## 2014-05-25 DIAGNOSIS — C3492 Malignant neoplasm of unspecified part of left bronchus or lung: Secondary | ICD-10-CM

## 2014-05-25 DIAGNOSIS — J029 Acute pharyngitis, unspecified: Secondary | ICD-10-CM

## 2014-05-25 LAB — CBC WITH DIFFERENTIAL/PLATELET
BASO%: 1.1 % (ref 0.0–2.0)
Basophils Absolute: 0.1 10*3/uL (ref 0.0–0.1)
EOS%: 2.2 % (ref 0.0–7.0)
Eosinophils Absolute: 0.1 10*3/uL (ref 0.0–0.5)
HCT: 38.1 % — ABNORMAL LOW (ref 38.4–49.9)
HGB: 12.6 g/dL — ABNORMAL LOW (ref 13.0–17.1)
LYMPH%: 15.6 % (ref 14.0–49.0)
MCH: 28.9 pg (ref 27.2–33.4)
MCHC: 33 g/dL (ref 32.0–36.0)
MCV: 87.5 fL (ref 79.3–98.0)
MONO#: 0.4 10*3/uL (ref 0.1–0.9)
MONO%: 8.4 % (ref 0.0–14.0)
NEUT#: 3.5 10*3/uL (ref 1.5–6.5)
NEUT%: 72.7 % (ref 39.0–75.0)
PLATELETS: 340 10*3/uL (ref 140–400)
RBC: 4.36 10*6/uL (ref 4.20–5.82)
RDW: 16 % — ABNORMAL HIGH (ref 11.0–14.6)
WBC: 4.9 10*3/uL (ref 4.0–10.3)
lymph#: 0.8 10*3/uL — ABNORMAL LOW (ref 0.9–3.3)

## 2014-05-25 LAB — COMPREHENSIVE METABOLIC PANEL (CC13)
ALK PHOS: 92 U/L (ref 40–150)
ALT: 29 U/L (ref 0–55)
ANION GAP: 9 meq/L (ref 3–11)
AST: 21 U/L (ref 5–34)
Albumin: 3.9 g/dL (ref 3.5–5.0)
BUN: 12 mg/dL (ref 7.0–26.0)
CHLORIDE: 103 meq/L (ref 98–109)
CO2: 23 mEq/L (ref 22–29)
CREATININE: 0.8 mg/dL (ref 0.7–1.3)
Calcium: 9.4 mg/dL (ref 8.4–10.4)
EGFR: >90 mL/min/{1.73_m2} (ref >90)
Glucose: 118 mg/dl (ref 70–140)
Potassium: 4.6 mEq/L (ref 3.5–5.1)
Sodium: 136 mEq/L (ref 136–145)
Total Bilirubin: 0.31 mg/dL (ref 0.20–1.20)
Total Protein: 7.7 g/dL (ref 6.4–8.3)

## 2014-05-25 MED ORDER — SODIUM CHLORIDE 0.9 % IV SOLN
Freq: Once | INTRAVENOUS | Status: AC
  Administered 2014-05-25: 12:00:00 via INTRAVENOUS

## 2014-05-25 MED ORDER — SODIUM CHLORIDE 0.9 % IV SOLN
Freq: Once | INTRAVENOUS | Status: AC
  Administered 2014-05-25: 12:00:00 via INTRAVENOUS
  Filled 2014-05-25: qty 8

## 2014-05-25 MED ORDER — PACLITAXEL PROTEIN-BOUND CHEMO INJECTION 100 MG
100.0000 mg/m2 | Freq: Once | INTRAVENOUS | Status: AC
  Administered 2014-05-25: 225 mg via INTRAVENOUS
  Filled 2014-05-25: qty 45

## 2014-05-25 MED ORDER — CARBOPLATIN CHEMO INJECTION 450 MG/45ML
300.0000 mg | Freq: Once | INTRAVENOUS | Status: AC
  Administered 2014-05-25: 300 mg via INTRAVENOUS
  Filled 2014-05-25: qty 30

## 2014-05-25 MED ORDER — HYDROCODONE-ACETAMINOPHEN 5-325 MG PO TABS: 1.0000 | ORAL_TABLET | Freq: Four times a day (QID) | ORAL | Status: DC | PRN
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
## 2014-05-25 NOTE — Patient Instructions (Signed)
[facility] Discharge Instructions for Patients Receiving Chemotherapy  Today you received the following chemotherapy agents: Abraxane and carbo platin.  To help prevent nausea and vomiting after your treatment, we encourage you to take your nausea medication: Compazine 10 mg every 6 hours.   If you develop nausea and vomiting that is not controlled by your nausea medication, call the clinic.   BELOW ARE SYMPTOMS THAT SHOULD BE REPORTED IMMEDIATELY:  *FEVER GREATER THAN 100.5 F  *CHILLS WITH OR WITHOUT FEVER  NAUSEA AND VOMITING THAT IS NOT CONTROLLED WITH YOUR NAUSEA MEDICATION  *UNUSUAL SHORTNESS OF BREATH  *UNUSUAL BRUISING OR BLEEDING  TENDERNESS IN MOUTH AND THROAT WITH OR WITHOUT PRESENCE OF ULCERS  *URINARY PROBLEMS  *BOWEL PROBLEMS  UNUSUAL RASH Items with * indicate a potential emergency and should be followed up as soon as possible.  Feel free to call the clinic you have any questions or concerns. The clinic phone number is [phone].  Please show the [facility] at check-in to the Emergency Department and triage nurse.

## 2014-05-26 ENCOUNTER — Encounter: Payer: Self-pay | Admitting: Radiation Oncology

## 2014-05-26 ENCOUNTER — Ambulatory Visit
Admission: RE | Admit: 2014-05-26 | Discharge: 2014-05-26 | Disposition: A | Discharge status: Home / Self Care | Payer: No Typology Code available for payment source | Source: Ambulatory Visit | Attending: Radiation Oncology | Admitting: Radiation Oncology

## 2014-05-26 ENCOUNTER — Other Ambulatory Visit: Payer: Self-pay | Admitting: *Deleted

## 2014-05-26 ENCOUNTER — Telehealth: Payer: Self-pay | Admitting: Internal Medicine

## 2014-05-26 VITALS — BP 129/101 | HR 104 | Temp 98.0°F | Resp 20 | Ht 68.0 in | Wt 233.3 lb

## 2014-05-26 DIAGNOSIS — C3492 Malignant neoplasm of unspecified part of left bronchus or lung: Secondary | ICD-10-CM

## 2014-05-26 MED ORDER — MAGIC MOUTHWASH W/LIDOCAINE: 5.0000 mL | Freq: Three times a day (TID) | ORAL | Status: DC | PRN

## 2014-05-26 MED ORDER — SUCRALFATE 1 G PO TABS: 1.0000 g | ORAL_TABLET | Freq: Three times a day (TID) | ORAL | Status: DC

## 2014-05-26 NOTE — Progress Notes (Signed)
Vincent Sutton has completed 16 fractions to his left chest.  He denies pain and a sore throat.  He reports his shortness of breath is better.  He reports having a cough.  He denies hemoptysis.  He had chemotherapy yesterday.  He continues to work full time.  He reports occasional fatigue that comes and goes.  He reports soreness in both sides of his mouth.  The skin on his left chest and upper back is red.  He is using radiaplex.    BP 129/101 mmHg  Pulse 104  Temp(Src) 98 F (36.7 C) (Oral)  Resp 20  Ht 5\' 8"  (1.727 m)  Wt 233 lb 4.8 oz (105.824 kg)  BMI 35.48 kg/m2  SpO2 96%

## 2014-05-26 NOTE — Progress Notes (Signed)
  Radiation Oncology         [phone] ________________________________  Name: Vincent Sutton MRN: [id number]  Date: 05/26/2014  DOB: 1960-04-21  Weekly Radiation Therapy Management  DIAGNOSIS: Stage IIA (T2b, N0, M0) non-small cell lung cancer ( squamous cell carcinoma)  Current Dose: 28.8 Gy     Planned Dose:  45 Gy  Narrative . . . . . . . . The patient presents for routine under treatment assessment.                                   The patient is without complaint. He does have some mouth soreness and was given Magic mouthwash for this issue. Overall his breathing has improved with stopping smoking and his treatment ongoing. He denies any significant pain with swallowing or difficulty swallowing. He is scheduled to pick up his Carafate later today. He continues to work full-time and has not noticed significant fatigue.                 Set-up films were reviewed.                                 The chart was checked. Physical Findings. . .  height is 5\' 8"  (1.727 m) and weight is 233 lb 4.8 oz (105.824 kg). His oral temperature is 98 F (36.7 C). His blood pressure is 129/101 and his pulse is 104. His respiration is 20 and oxygen saturation is 96%. . Weight essentially stable.  No significant changes. Impression . . . . . . . The patient is tolerating radiation. Plan . . . . . . . . . . . . Continue treatment as planned.  ________________________________   Blair Promise, PhD, MD

## 2014-05-26 NOTE — Telephone Encounter (Signed)
Prescription for Magic Mouthwash with Lidocaine phoned in to patient's pharmacy.  Per Awilda Metro, PA.

## 2014-05-26 NOTE — Telephone Encounter (Signed)
s.w. pt and advised on added appt pt ok and aware

## 2014-05-27 ENCOUNTER — Ambulatory Visit
Admission: RE | Admit: 2014-05-27 | Discharge: 2014-05-27 | Disposition: A | Discharge status: Home / Self Care | Payer: No Typology Code available for payment source | Source: Ambulatory Visit | Attending: Radiation Oncology | Admitting: Radiation Oncology

## 2014-05-27 DIAGNOSIS — C3492 Malignant neoplasm of unspecified part of left bronchus or lung: Secondary | ICD-10-CM | POA: Diagnosis not present

## 2014-05-27 NOTE — Patient Instructions (Signed)
Continue your course of concurrent chemoradiation as scheduled Follow-up in 2 weeks

## 2014-05-28 ENCOUNTER — Ambulatory Visit
Admission: RE | Admit: 2014-05-28 | Discharge: 2014-05-28 | Disposition: A | Discharge status: Home / Self Care | Payer: No Typology Code available for payment source | Source: Ambulatory Visit | Attending: Radiation Oncology | Admitting: Radiation Oncology

## 2014-05-28 DIAGNOSIS — C3492 Malignant neoplasm of unspecified part of left bronchus or lung: Secondary | ICD-10-CM | POA: Diagnosis not present

## 2014-05-28 LAB — AFB CULTURE WITH SMEAR (NOT AT ARMC)
ACID FAST SMEAR: NONE SEEN
Special Requests: NORMAL

## 2014-05-29 ENCOUNTER — Ambulatory Visit
Admission: RE | Admit: 2014-05-29 | Discharge: 2014-05-29 | Disposition: A | Discharge status: Home / Self Care | Payer: No Typology Code available for payment source | Source: Ambulatory Visit | Attending: Radiation Oncology | Admitting: Radiation Oncology

## 2014-05-29 DIAGNOSIS — C3492 Malignant neoplasm of unspecified part of left bronchus or lung: Secondary | ICD-10-CM | POA: Diagnosis not present

## 2014-06-01 ENCOUNTER — Ambulatory Visit (HOSPITAL_BASED_OUTPATIENT_CLINIC_OR_DEPARTMENT_OTHER): Payer: No Typology Code available for payment source

## 2014-06-01 ENCOUNTER — Ambulatory Visit
Admission: RE | Admit: 2014-06-01 | Discharge: 2014-06-01 | Disposition: A | Discharge status: Home / Self Care | Payer: No Typology Code available for payment source | Source: Ambulatory Visit | Attending: Radiation Oncology | Admitting: Radiation Oncology

## 2014-06-01 ENCOUNTER — Other Ambulatory Visit (HOSPITAL_BASED_OUTPATIENT_CLINIC_OR_DEPARTMENT_OTHER): Payer: No Typology Code available for payment source

## 2014-06-01 ENCOUNTER — Other Ambulatory Visit: Payer: Self-pay | Admitting: *Deleted

## 2014-06-01 DIAGNOSIS — C3492 Malignant neoplasm of unspecified part of left bronchus or lung: Secondary | ICD-10-CM

## 2014-06-01 DIAGNOSIS — Z5111 Encounter for antineoplastic chemotherapy: Secondary | ICD-10-CM

## 2014-06-01 DIAGNOSIS — C3490 Malignant neoplasm of unspecified part of unspecified bronchus or lung: Secondary | ICD-10-CM

## 2014-06-01 LAB — COMPREHENSIVE METABOLIC PANEL (CC13)
ALT: 29 U/L (ref 0–55)
ANION GAP: 10 meq/L (ref 3–11)
AST: 19 U/L (ref 5–34)
Albumin: 3.8 g/dL (ref 3.5–5.0)
Alkaline Phosphatase: 81 U/L (ref 40–150)
BUN: 8.8 mg/dL (ref 7.0–26.0)
CHLORIDE: 106 meq/L (ref 98–109)
CO2: 25 mEq/L (ref 22–29)
Calcium: 9.2 mg/dL (ref 8.4–10.4)
Creatinine: 0.8 mg/dL (ref 0.7–1.3)
EGFR: >90 mL/min/{1.73_m2} (ref >90)
Glucose: 117 mg/dl (ref 70–140)
POTASSIUM: 4.2 meq/L (ref 3.5–5.1)
SODIUM: 140 meq/L (ref 136–145)
Total Bilirubin: 0.21 mg/dL (ref 0.20–1.20)
Total Protein: 7.2 g/dL (ref 6.4–8.3)

## 2014-06-01 LAB — CBC WITH DIFFERENTIAL/PLATELET
BASO%: 1.3 % (ref 0.0–2.0)
Basophils Absolute: 0.1 10*3/uL (ref 0.0–0.1)
EOS ABS: 0.1 10*3/uL (ref 0.0–0.5)
EOS%: 2.1 % (ref 0.0–7.0)
HCT: 35.9 % — ABNORMAL LOW (ref 38.4–49.9)
HGB: 11.9 g/dL — ABNORMAL LOW (ref 13.0–17.1)
LYMPH#: 0.8 10*3/uL — AB (ref 0.9–3.3)
LYMPH%: 16.6 % (ref 14.0–49.0)
MCH: 29.4 pg (ref 27.2–33.4)
MCHC: 33.2 g/dL (ref 32.0–36.0)
MCV: 88.4 fL (ref 79.3–98.0)
MONO#: 0.5 10*3/uL (ref 0.1–0.9)
MONO%: 9.7 % (ref 0.0–14.0)
NEUT%: 70.3 % (ref 39.0–75.0)
NEUTROS ABS: 3.3 10*3/uL (ref 1.5–6.5)
Platelets: 301 10*3/uL (ref 140–400)
RBC: 4.06 10*6/uL — AB (ref 4.20–5.82)
RDW: 16.3 % — ABNORMAL HIGH (ref 11.0–14.6)
WBC: 4.7 10*3/uL (ref 4.0–10.3)

## 2014-06-01 MED ORDER — SODIUM CHLORIDE 0.9 % IV SOLN
Freq: Once | INTRAVENOUS | Status: AC
  Administered 2014-06-01: 11:00:00 via INTRAVENOUS

## 2014-06-01 MED ORDER — SODIUM CHLORIDE 0.9 % IV SOLN
Freq: Once | INTRAVENOUS | Status: AC
  Administered 2014-06-01: 11:00:00 via INTRAVENOUS
  Filled 2014-06-01: qty 8

## 2014-06-01 MED ORDER — SODIUM CHLORIDE 0.9 % IV SOLN
300.0000 mg | Freq: Once | INTRAVENOUS | Status: AC
  Administered 2014-06-01: 300 mg via INTRAVENOUS
  Filled 2014-06-01: qty 30

## 2014-06-01 MED ORDER — PACLITAXEL PROTEIN-BOUND CHEMO INJECTION 100 MG
100.0000 mg/m2 | Freq: Once | INTRAVENOUS | Status: AC
  Administered 2014-06-01: 225 mg via INTRAVENOUS
  Filled 2014-06-01: qty 45

## 2014-06-01 NOTE — Patient Instructions (Signed)
[facility] Discharge Instructions for Patients Receiving Chemotherapy  Today you received the following chemotherapy agents abraxane/carboplatin  To help prevent nausea and vomiting after your treatment, we encourage you to take your nausea medication as directed   If you develop nausea and vomiting that is not controlled by your nausea medication, call the clinic.   BELOW ARE SYMPTOMS THAT SHOULD BE REPORTED IMMEDIATELY:  *FEVER GREATER THAN 100.5 F  *CHILLS WITH OR WITHOUT FEVER  NAUSEA AND VOMITING THAT IS NOT CONTROLLED WITH YOUR NAUSEA MEDICATION  *UNUSUAL SHORTNESS OF BREATH  *UNUSUAL BRUISING OR BLEEDING  TENDERNESS IN MOUTH AND THROAT WITH OR WITHOUT PRESENCE OF ULCERS  *URINARY PROBLEMS  *BOWEL PROBLEMS  UNUSUAL RASH Items with * indicate a potential emergency and should be followed up as soon as possible.  Feel free to call the clinic you have any questions or concerns. The clinic phone number is [phone].

## 2014-06-02 ENCOUNTER — Ambulatory Visit
Admission: RE | Admit: 2014-06-02 | Discharge: 2014-06-02 | Disposition: A | Discharge status: Home / Self Care | Payer: No Typology Code available for payment source | Source: Ambulatory Visit | Attending: Radiation Oncology | Admitting: Radiation Oncology

## 2014-06-02 ENCOUNTER — Encounter: Payer: Self-pay | Admitting: Radiation Oncology

## 2014-06-02 VITALS — BP 134/82 | HR 92 | Temp 97.8°F | Resp 16 | Ht 68.0 in | Wt 237.1 lb

## 2014-06-02 DIAGNOSIS — C3492 Malignant neoplasm of unspecified part of left bronchus or lung: Secondary | ICD-10-CM

## 2014-06-02 NOTE — Progress Notes (Signed)
  Radiation Oncology         [phone] ________________________________  Name: Vincent Sutton MRN: [id number]  Date: 06/02/2014  DOB: [birth year]  Weekly Radiation Therapy Management  DIAGNOSIS: Stage IIA (T2b, N0, M0) non-small cell lung cancer ( squamous cell carcinoma)  Current Dose: 37.8 Gy     Planned Dose:  45 Gy  Narrative . . . . . . . . The patient presents for routine under treatment assessment.                                   The patient is without complaint. She continues to notice improvement in his breathing. He does have some discomfort with swallowing but does not wish to take hydrocodone as this makes him nauseated.                                 Set-up films were reviewed.                                 The chart was checked. Physical Findings. . .  height is 5\' 8"  (1.727 m) and weight is 237 lb 1.6 oz (107.548 kg). His oral temperature is 97.8 F (36.6 C). His blood pressure is 134/82 and his pulse is 92. His respiration is 16 and oxygen saturation is 96%. . The lungs are clear. The heart has a regular rhythm and rate. Impression . . . . . . . The patient is tolerating radiation. Plan . . . . . . . . . . . . Continue treatment as planned.  ________________________________   Blair Promise, PhD, MD

## 2014-06-02 NOTE — Progress Notes (Signed)
Vincent Sutton has completed 20 fractions to his left chest.  He denies pain.  He does report occasional trouble swallowing.  He is taking Carafate.  He reports that he feels "wonderful" today and the best since he was diagnosed with cancer.  He reports his breathing is better.  He reports coughing rarely with white sputum.  He denies hemoptysis.  He had chemotherapy yesterday.  He denies nausea and his weight is stable.  He denies fatigue and is working full time.  His left chest and left upper back.  He is using radiaplex.  BP 134/82 mmHg  Pulse 92  Temp(Src) 97.8 F (36.6 C) (Oral)  Resp 16  Ht 5\' 8"  (1.727 m)  Wt 237 lb 1.6 oz (107.548 kg)  BMI 36.06 kg/m2  SpO2 96%

## 2014-06-03 ENCOUNTER — Ambulatory Visit
Admission: RE | Admit: 2014-06-03 | Discharge: 2014-06-03 | Disposition: A | Discharge status: Home / Self Care | Payer: No Typology Code available for payment source | Source: Ambulatory Visit | Attending: Radiation Oncology | Admitting: Radiation Oncology

## 2014-06-03 DIAGNOSIS — C3492 Malignant neoplasm of unspecified part of left bronchus or lung: Secondary | ICD-10-CM | POA: Diagnosis not present

## 2014-06-04 ENCOUNTER — Ambulatory Visit
Admission: RE | Admit: 2014-06-04 | Discharge: 2014-06-04 | Disposition: A | Discharge status: Home / Self Care | Payer: No Typology Code available for payment source | Source: Ambulatory Visit | Attending: Radiation Oncology | Admitting: Radiation Oncology

## 2014-06-04 DIAGNOSIS — C3492 Malignant neoplasm of unspecified part of left bronchus or lung: Secondary | ICD-10-CM | POA: Diagnosis not present

## 2014-06-05 ENCOUNTER — Ambulatory Visit
Admission: RE | Admit: 2014-06-05 | Discharge: 2014-06-05 | Disposition: A | Discharge status: Home / Self Care | Payer: No Typology Code available for payment source | Source: Ambulatory Visit | Attending: Radiation Oncology | Admitting: Radiation Oncology

## 2014-06-05 DIAGNOSIS — C3492 Malignant neoplasm of unspecified part of left bronchus or lung: Secondary | ICD-10-CM | POA: Diagnosis not present

## 2014-06-08 ENCOUNTER — Other Ambulatory Visit (HOSPITAL_BASED_OUTPATIENT_CLINIC_OR_DEPARTMENT_OTHER): Payer: No Typology Code available for payment source

## 2014-06-08 ENCOUNTER — Telehealth: Payer: Self-pay | Admitting: Oncology

## 2014-06-08 ENCOUNTER — Ambulatory Visit (HOSPITAL_BASED_OUTPATIENT_CLINIC_OR_DEPARTMENT_OTHER): Payer: No Typology Code available for payment source

## 2014-06-08 ENCOUNTER — Encounter: Payer: Self-pay | Admitting: Radiation Oncology

## 2014-06-08 ENCOUNTER — Ambulatory Visit (HOSPITAL_BASED_OUTPATIENT_CLINIC_OR_DEPARTMENT_OTHER): Payer: No Typology Code available for payment source | Admitting: Oncology

## 2014-06-08 ENCOUNTER — Ambulatory Visit
Admission: RE | Admit: 2014-06-08 | Discharge: 2014-06-08 | Disposition: A | Discharge status: Home / Self Care | Payer: No Typology Code available for payment source | Source: Ambulatory Visit | Attending: Radiation Oncology | Admitting: Radiation Oncology

## 2014-06-08 ENCOUNTER — Encounter: Payer: Self-pay | Admitting: Oncology

## 2014-06-08 VITALS — BP 132/81 | HR 96 | Temp 98.1°F | Resp 21 | Ht 68.0 in | Wt 234.6 lb

## 2014-06-08 DIAGNOSIS — G893 Neoplasm related pain (acute) (chronic): Secondary | ICD-10-CM | POA: Diagnosis not present

## 2014-06-08 DIAGNOSIS — C3412 Malignant neoplasm of upper lobe, left bronchus or lung: Secondary | ICD-10-CM

## 2014-06-08 DIAGNOSIS — Z5111 Encounter for antineoplastic chemotherapy: Secondary | ICD-10-CM | POA: Diagnosis not present

## 2014-06-08 DIAGNOSIS — C3492 Malignant neoplasm of unspecified part of left bronchus or lung: Secondary | ICD-10-CM

## 2014-06-08 DIAGNOSIS — C3490 Malignant neoplasm of unspecified part of unspecified bronchus or lung: Secondary | ICD-10-CM

## 2014-06-08 DIAGNOSIS — R05 Cough: Secondary | ICD-10-CM | POA: Diagnosis not present

## 2014-06-08 LAB — COMPREHENSIVE METABOLIC PANEL (CC13)
ALT: 29 U/L (ref 0–55)
AST: 20 U/L (ref 5–34)
Albumin: 4.1 g/dL (ref 3.5–5.0)
Alkaline Phosphatase: 85 U/L (ref 40–150)
Anion Gap: 11 mEq/L (ref 3–11)
BILIRUBIN TOTAL: 0.39 mg/dL (ref 0.20–1.20)
BUN: 9.9 mg/dL (ref 7.0–26.0)
CHLORIDE: 103 meq/L (ref 98–109)
CO2: 21 mEq/L — ABNORMAL LOW (ref 22–29)
Calcium: 9.2 mg/dL (ref 8.4–10.4)
Creatinine: 0.8 mg/dL (ref 0.7–1.3)
EGFR: >90 mL/min/{1.73_m2} (ref >90)
Glucose: 101 mg/dl (ref 70–140)
Potassium: 4.2 mEq/L (ref 3.5–5.1)
SODIUM: 135 meq/L — AB (ref 136–145)
Total Protein: 7.5 g/dL (ref 6.4–8.3)

## 2014-06-08 LAB — CBC WITH DIFFERENTIAL/PLATELET
BASO%: 0.9 % (ref 0.0–2.0)
BASOS ABS: 0 10*3/uL (ref 0.0–0.1)
EOS ABS: 0.1 10*3/uL (ref 0.0–0.5)
EOS%: 1.5 % (ref 0.0–7.0)
HCT: 36.4 % — ABNORMAL LOW (ref 38.4–49.9)
HGB: 12.1 g/dL — ABNORMAL LOW (ref 13.0–17.1)
LYMPH%: 12.8 % — ABNORMAL LOW (ref 14.0–49.0)
MCH: 29.5 pg (ref 27.2–33.4)
MCHC: 33.3 g/dL (ref 32.0–36.0)
MCV: 88.5 fL (ref 79.3–98.0)
MONO#: 0.5 10*3/uL (ref 0.1–0.9)
MONO%: 10 % (ref 0.0–14.0)
NEUT%: 74.8 % (ref 39.0–75.0)
NEUTROS ABS: 3.9 10*3/uL (ref 1.5–6.5)
PLATELETS: 258 10*3/uL (ref 140–400)
RBC: 4.11 10*6/uL — ABNORMAL LOW (ref 4.20–5.82)
RDW: 16.7 % — AB (ref 11.0–14.6)
WBC: 5.2 10*3/uL (ref 4.0–10.3)
lymph#: 0.7 10*3/uL — ABNORMAL LOW (ref 0.9–3.3)

## 2014-06-08 MED ORDER — SODIUM CHLORIDE 0.9 % IV SOLN
Freq: Once | INTRAVENOUS | Status: AC
  Administered 2014-06-08: 13:00:00 via INTRAVENOUS

## 2014-06-08 MED ORDER — SODIUM CHLORIDE 0.9 % IV SOLN
300.0000 mg | Freq: Once | INTRAVENOUS | Status: AC
  Administered 2014-06-08: 300 mg via INTRAVENOUS
  Filled 2014-06-08: qty 30

## 2014-06-08 MED ORDER — PACLITAXEL PROTEIN-BOUND CHEMO INJECTION 100 MG
100.0000 mg/m2 | Freq: Once | INTRAVENOUS | Status: AC
  Administered 2014-06-08: 225 mg via INTRAVENOUS
  Filled 2014-06-08: qty 45

## 2014-06-08 MED ORDER — BIAFINE EX EMUL
Freq: Once | CUTANEOUS | Status: AC
  Administered 2014-06-08: 17:00:00 via TOPICAL

## 2014-06-08 MED ORDER — OXYCODONE-ACETAMINOPHEN 5-325 MG PO TABS: 1.0000 | ORAL_TABLET | Freq: Four times a day (QID) | ORAL | Status: DC | PRN

## 2014-06-08 MED ORDER — DEXAMETHASONE SODIUM PHOSPHATE 100 MG/10ML IJ SOLN
Freq: Once | INTRAMUSCULAR | Status: AC
  Administered 2014-06-08: 13:00:00 via INTRAVENOUS
  Filled 2014-06-08: qty 8

## 2014-06-08 NOTE — Patient Instructions (Signed)
[facility] Discharge Instructions for Patients Receiving Chemotherapy  Today you received the following chemotherapy agents abraxane/carboplatin  To help prevent nausea and vomiting after your treatment, we encourage you to take your nausea medication as directed   If you develop nausea and vomiting that is not controlled by your nausea medication, call the clinic.   BELOW ARE SYMPTOMS THAT SHOULD BE REPORTED IMMEDIATELY:  *FEVER GREATER THAN 100.5 F  *CHILLS WITH OR WITHOUT FEVER  NAUSEA AND VOMITING THAT IS NOT CONTROLLED WITH YOUR NAUSEA MEDICATION  *UNUSUAL SHORTNESS OF BREATH  *UNUSUAL BRUISING OR BLEEDING  TENDERNESS IN MOUTH AND THROAT WITH OR WITHOUT PRESENCE OF ULCERS  *URINARY PROBLEMS  *BOWEL PROBLEMS  UNUSUAL RASH Items with * indicate a potential emergency and should be followed up as soon as possible.  Feel free to call the clinic you have any questions or concerns. The clinic phone number is [phone].

## 2014-06-08 NOTE — Telephone Encounter (Signed)
Appointments made and avs will be printed for patient in chemo
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
## 2014-06-10 ENCOUNTER — Encounter: Payer: Self-pay | Admitting: Radiation Oncology

## 2014-06-10 ENCOUNTER — Other Ambulatory Visit: Payer: Self-pay | Admitting: *Deleted

## 2014-06-10 ENCOUNTER — Ambulatory Visit
Admission: RE | Admit: 2014-06-10 | Discharge: 2014-06-10 | Disposition: A | Discharge status: Home / Self Care | Payer: No Typology Code available for payment source | Source: Ambulatory Visit | Attending: Radiation Oncology | Admitting: Radiation Oncology

## 2014-06-10 ENCOUNTER — Telehealth: Payer: Self-pay | Admitting: *Deleted

## 2014-06-10 VITALS — BP 143/65 | HR 93 | Temp 97.7°F | Resp 20

## 2014-06-10 DIAGNOSIS — C3492 Malignant neoplasm of unspecified part of left bronchus or lung: Secondary | ICD-10-CM

## 2014-06-10 DIAGNOSIS — C3402 Malignant neoplasm of left main bronchus: Secondary | ICD-10-CM

## 2014-06-10 MED ORDER — RADIAPLEXRX EX GEL
Freq: Once | CUTANEOUS | Status: AC
  Administered 2014-06-10: 12:00:00 via TOPICAL

## 2014-06-10 MED ORDER — PROCHLORPERAZINE MALEATE 10 MG PO TABS: 10.0000 mg | ORAL_TABLET | Freq: Four times a day (QID) | ORAL | Status: DC | PRN

## 2014-06-10 NOTE — Telephone Encounter (Signed)
Mr. Thall called reporting he "could not breath and felt stopped up last night.  Blew nose, coughed and a dime size amount of mucous with cauterized tissue on the ends came up.  I think it's a tumor.  Is this normal?  Dr. Julien Nordmann has told me this is normal but I haven't had one this size come up before.  I feel better now that it's up.  I also wake up at 3:00 am and am nauseated.  I'm using compazine and will need a refill to [facility] at [place].  I also need the radioplex cream.  What I was given isn't the same tube and I had a red reaction with it."  Informed him the size of what he brought up doesn't change the normalcy but he can ask RT.  Will refill compazine and call transferred to [facility] in Radiation who is Dr. Clabe Seal nurse. He denies any vomiting and eating 4 to 5 meals a day and drinking fluids.
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
## 2014-06-10 NOTE — Progress Notes (Signed)
Radiation Oncology         (336) [phone] ________________________________  Name: Vincent Sutton MRN: [id number]  Date: 06/10/2014  DOB: 06-May-1960  Follow-Up Visit Note  CC: [name], MD  [name], MD    ICD-9-CM ICD-10-CM   1. Squamous cell carcinoma of lung, left 162.9 C34.92 hyaluronate sodium (RADIAPLEXRX) gel    Diagnosis:  Stage IIA (T2b, N0, M0) non-small cell lung cancer ( squamous cell carcinoma)   Interval Since Last Radiation:  2  days  Narrative:  The patient stopped by to ask about his Biafine cream. Apparently when putting this on he had burning along his skin and more of a skin reaction. We have asked him to stop using this and go back to the radioplex. Patient seems to be doing well at this time. He has coughed up some mucus   hasn't had no bleeding. His breathing is good at this time.                              ALLERGIES:  is allergic to taxol and biafine.  Meds: Current Outpatient Prescriptions  Medication Sig Dispense Refill  . albuterol (PROVENTIL HFA;VENTOLIN HFA) 108 (90 BASE) MCG/ACT inhaler Inhale 2 puffs into the lungs every 6 (six) hours as needed for wheezing or shortness of breath. 1 Inhaler 6  . ALPRAZolam (XANAX) 0.25 MG tablet Take 1/2 to 1 tablet by mouth three times daily as needed for anxiety 50 tablet 1  . hyaluronate sodium (RADIAPLEXRX) GEL Apply 1 application topically once.    . mometasone-formoterol (DULERA) 200-5 MCG/ACT AERO Inhale 2 puffs into the lungs 2 (two) times daily. 1 Inhaler 6  . prochlorperazine (COMPAZINE) 10 MG tablet Take 1 tablet (10 mg total) by mouth every 6 (six) hours as needed for nausea or vomiting. 30 tablet 1  . sucralfate (CARAFATE) 1 G tablet Take 1 tablet (1 g total) by mouth 4 (four) times daily -  with meals and at bedtime. 120 tablet 1  . tiotropium (SPIRIVA HANDIHALER) 18 MCG inhalation capsule PLACE 1 CAPSULE (18 MCG TOTAL) INTO INHALER AND INHALE DAILY. 30 capsule 11  . Alum & Mag Hydroxide-Simeth  (MAGIC MOUTHWASH W/LIDOCAINE) SOLN Take 5 mLs by mouth 3 (three) times daily as needed for mouth pain. Swish and spit 240 mL 0  . DPH-Lido-AlHydr-MgHydr-Simeth (FIRST-MOUTHWASH BLM) SUSP   0  . emollient (BIAFINE) cream Apply topically as needed.    [name] HYDROcodone-acetaminophen (NORCO/VICODIN) 5-325 MG per tablet Take 1 tablet by mouth every 6 (six) hours as needed for moderate pain. (Patient not taking: Reported on 06/10/2014) 30 tablet 0  . oxyCODONE-acetaminophen (PERCOCET/ROXICET) 5-325 MG per tablet Take 1 tablet by mouth every 6 (six) hours as needed for severe pain. 30 tablet 0   No current facility-administered medications for this encounter.    Physical Findings: The patient is in no acute distress. Patient is alert and oriented.  temperature is 97.7 F (36.5 C). His blood pressure is 143/65 and his pulse is 93. His respiration is 20 and oxygen saturation is 98%. .  The lungs are clear. The heart has regular rhythm and rate. Examination of the skin of the chest region reveals erythema without any significant dry desquamation or moist desquamation  Lab Findings: Lab Results  Component Value Date   WBC 5.2 06/08/2014   HGB 12.1* 06/08/2014   HCT 36.4* 06/08/2014   MCV 88.5 06/08/2014   PLT 258 06/08/2014  Radiographic Findings: No results found.  Impression:  The patient is recovering from the effects of radiation.    Plan:  He will keep his already scheduled follow-up appointment in late may, scheduled after his CT scans.  ____________________________________ Blair Promise, MD

## 2014-06-12 NOTE — Progress Notes (Incomplete)
°  Radiation Oncology         (336) [phone] ________________________________  Name: Vincent Sutton MRN: [id number]  Date: 06/08/2014  DOB: June 15, 1960  End of Treatment Note   ICD-9-CM ICD-10-CM    1. Squamous cell carcinoma of lung, left 162.9 C34.92    DIAGNOSIS: Stage IIA (T2b, N0, M0) non-small cell lung cancer ( squamous cell carcinoma)     Indication for treatment:  ***       Radiation treatment dates:   05/05/2014-06/08/2014  Site/dose:   ***  Beams/energy:   ***  Narrative: The patient tolerated radiation treatment relatively well.   ***  Plan: The patient has completed radiation treatment. The patient will return to radiation oncology clinic for routine followup in one month. I advised them to call or return sooner if they have any questions or concerns related to their recovery or treatment.  -----------------------------------  Blair Promise, PhD, MD

## 2014-06-13 ENCOUNTER — Other Ambulatory Visit: Payer: Self-pay | Admitting: Internal Medicine

## 2014-06-15 ENCOUNTER — Encounter: Payer: Self-pay | Admitting: Radiation Oncology

## 2014-06-15 NOTE — Progress Notes (Signed)
  Radiation Oncology         [phone]) [phone] ________________________________  Name: Vincent Sutton MRN: [id number]  Date: 06/15/2014  DOB: 20-Mar-1960  End of Treatment Note  Diagnosis:    Stage IIA (T2b, N0, M0) non-small cell lung cancer ( squamous cell carcinoma)   Indication for treatment:  Preop along with radiosensitizing chemotherapy     Radiation treatment dates:   March 15 through April 18   Site/dose:   Left hilar region 45 gray in 25 fractions  Beams/energy:   3-D conformal using 7 field beam arrangement  Narrative: The patient tolerated radiation treatment relatively well.   His breathing improved throughout his course of treatment. He had some mild esophageal symptoms.  Plan: The patient has completed radiation treatment. The patient will return to radiation oncology clinic for routine followup in one month. I advised them to call or return sooner if they have any questions or concerns related to their recovery or treatment.  Chest CT scan is scheduled for May 23 and follow-up with radiation oncology and medical oncology soon afterwards  -----------------------------------  Blair Promise, PhD, MD

## 2014-06-23 ENCOUNTER — Other Ambulatory Visit: Payer: Self-pay | Admitting: Adult Health

## 2014-06-24 NOTE — Telephone Encounter (Signed)
Please advise on refill request

## 2014-06-26 ENCOUNTER — Telehealth: Payer: Self-pay | Admitting: Pulmonary Disease

## 2014-06-26 ENCOUNTER — Other Ambulatory Visit: Payer: Self-pay | Admitting: Pulmonary Disease

## 2014-06-26 MED ORDER — ALPRAZOLAM 0.25 MG PO TABS: ORAL_TABLET | ORAL | Status: DC

## 2014-06-26 NOTE — Telephone Encounter (Signed)
Per SN, ok to refill xanax 0.'5mg'$  po TID prn anxiety #90 no refills. Thanks

## 2014-06-26 NOTE — Telephone Encounter (Signed)
Called and spoke to [place] as the previous rx is different than the rx stated below. PER SN-- rx is 0.'25mg'$ , take 1 tab qhs prn #90 with 0 refills. Called and spoke to the pt's wife and informed her of the refill. Rx called into preferred pharmacy. Pt's wife verbalized understanding and denied any further questions or concerns at this time.

## 2014-06-26 NOTE — Telephone Encounter (Signed)
Last OV 04/03/14 Pending OV 10/02/14 Last refill 06/25/14 #20, printed by SN.  SN - please advise. Thanks.

## 2014-06-29 ENCOUNTER — Other Ambulatory Visit: Payer: Self-pay | Admitting: Pulmonary Disease

## 2014-07-02 ENCOUNTER — Ambulatory Visit (HOSPITAL_COMMUNITY): Payer: No Typology Code available for payment source

## 2014-07-08 ENCOUNTER — Telehealth: Payer: Self-pay | Admitting: Internal Medicine

## 2014-07-08 NOTE — Telephone Encounter (Signed)
returned call and lvm for pt to call radiology to r/s ct.

## 2014-07-08 NOTE — Telephone Encounter (Signed)
pt wife call back and s.w and confirmed appts.

## 2014-07-13 ENCOUNTER — Other Ambulatory Visit (HOSPITAL_BASED_OUTPATIENT_CLINIC_OR_DEPARTMENT_OTHER): Payer: No Typology Code available for payment source

## 2014-07-13 ENCOUNTER — Ambulatory Visit (HOSPITAL_COMMUNITY)
Admission: RE | Admit: 2014-07-13 | Discharge: 2014-07-13 | Disposition: A | Discharge status: Home / Self Care | Payer: No Typology Code available for payment source | Source: Ambulatory Visit | Attending: Oncology | Admitting: Oncology

## 2014-07-13 ENCOUNTER — Encounter (HOSPITAL_COMMUNITY): Payer: Self-pay

## 2014-07-13 DIAGNOSIS — R918 Other nonspecific abnormal finding of lung field: Secondary | ICD-10-CM | POA: Insufficient documentation

## 2014-07-13 DIAGNOSIS — Z85118 Personal history of other malignant neoplasm of bronchus and lung: Secondary | ICD-10-CM | POA: Insufficient documentation

## 2014-07-13 DIAGNOSIS — C3492 Malignant neoplasm of unspecified part of left bronchus or lung: Secondary | ICD-10-CM

## 2014-07-13 DIAGNOSIS — C3412 Malignant neoplasm of upper lobe, left bronchus or lung: Secondary | ICD-10-CM | POA: Diagnosis not present

## 2014-07-13 DIAGNOSIS — C3490 Malignant neoplasm of unspecified part of unspecified bronchus or lung: Secondary | ICD-10-CM

## 2014-07-13 LAB — CBC WITH DIFFERENTIAL/PLATELET
BASO%: 0.9 % (ref 0.0–2.0)
BASOS ABS: 0.1 10*3/uL (ref 0.0–0.1)
EOS%: 2.9 % (ref 0.0–7.0)
Eosinophils Absolute: 0.2 10*3/uL (ref 0.0–0.5)
HCT: 37.9 % — ABNORMAL LOW (ref 38.4–49.9)
HGB: 12.8 g/dL — ABNORMAL LOW (ref 13.0–17.1)
LYMPH%: 18.2 % (ref 14.0–49.0)
MCH: 31.7 pg (ref 27.2–33.4)
MCHC: 33.8 g/dL (ref 32.0–36.0)
MCV: 93.7 fL (ref 79.3–98.0)
MONO#: 1 10*3/uL — ABNORMAL HIGH (ref 0.1–0.9)
MONO%: 17.6 % — AB (ref 0.0–14.0)
NEUT#: 3.5 10*3/uL (ref 1.5–6.5)
NEUT%: 60.4 % (ref 39.0–75.0)
Platelets: 364 10*3/uL (ref 140–400)
RBC: 4.04 10*6/uL — AB (ref 4.20–5.82)
RDW: 20.5 % — ABNORMAL HIGH (ref 11.0–14.6)
WBC: 5.7 10*3/uL (ref 4.0–10.3)
lymph#: 1 10*3/uL (ref 0.9–3.3)

## 2014-07-13 LAB — COMPREHENSIVE METABOLIC PANEL (CC13)
ALT: 27 U/L (ref 0–55)
AST: 26 U/L (ref 5–34)
Albumin: 3.9 g/dL (ref 3.5–5.0)
Alkaline Phosphatase: 83 U/L (ref 40–150)
Anion Gap: 12 mEq/L — ABNORMAL HIGH (ref 3–11)
BILIRUBIN TOTAL: 0.3 mg/dL (ref 0.20–1.20)
BUN: 13.6 mg/dL (ref 7.0–26.0)
CALCIUM: 9.2 mg/dL (ref 8.4–10.4)
CO2: 26 meq/L (ref 22–29)
Chloride: 103 mEq/L (ref 98–109)
Creatinine: 0.9 mg/dL (ref 0.7–1.3)
EGFR: >90 mL/min/{1.73_m2} (ref >90)
Glucose: 101 mg/dl (ref 70–140)
Potassium: 4 mEq/L (ref 3.5–5.1)
Sodium: 141 mEq/L (ref 136–145)
Total Protein: 7.4 g/dL (ref 6.4–8.3)

## 2014-07-13 MED ORDER — IOHEXOL 300 MG/ML  SOLN
100.0000 mL | Freq: Once | INTRAMUSCULAR | Status: AC | PRN
  Administered 2014-07-13: 100 mL via INTRAVENOUS

## 2014-07-14 ENCOUNTER — Telehealth: Payer: Self-pay | Admitting: Internal Medicine

## 2014-07-14 NOTE — Telephone Encounter (Signed)
returned call and s.w. pt and confirmed 5.26 appts.Marland KitchenMarland KitchenMarland KitchenMarland Kitchenpt ok and aware

## 2014-07-15 ENCOUNTER — Encounter: Payer: Self-pay | Admitting: Oncology

## 2014-07-16 ENCOUNTER — Encounter: Payer: Self-pay | Admitting: Internal Medicine

## 2014-07-16 ENCOUNTER — Ambulatory Visit (HOSPITAL_BASED_OUTPATIENT_CLINIC_OR_DEPARTMENT_OTHER): Payer: No Typology Code available for payment source | Admitting: Internal Medicine

## 2014-07-16 ENCOUNTER — Ambulatory Visit
Admission: RE | Admit: 2014-07-16 | Discharge: 2014-07-16 | Disposition: A | Discharge status: Home / Self Care | Payer: No Typology Code available for payment source | Source: Ambulatory Visit | Attending: Radiation Oncology | Admitting: Radiation Oncology

## 2014-07-16 ENCOUNTER — Encounter: Payer: Self-pay | Admitting: Radiation Oncology

## 2014-07-16 VITALS — BP 134/82 | HR 75 | Temp 97.9°F | Resp 19 | Ht 68.0 in | Wt 233.7 lb

## 2014-07-16 VITALS — BP 126/77 | HR 75 | Temp 98.2°F | Resp 20 | Ht 68.0 in | Wt 234.6 lb

## 2014-07-16 DIAGNOSIS — C3492 Malignant neoplasm of unspecified part of left bronchus or lung: Secondary | ICD-10-CM

## 2014-07-16 DIAGNOSIS — Z720 Tobacco use: Secondary | ICD-10-CM | POA: Diagnosis not present

## 2014-07-16 DIAGNOSIS — C3412 Malignant neoplasm of upper lobe, left bronchus or lung: Secondary | ICD-10-CM

## 2014-07-16 NOTE — Progress Notes (Signed)
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 He reports occasional burning pain in his left chest. He denies having a cough, sore throat or trouble swallowing. He reports his breathing has improved and is was able to walk in from the parking lot today. He reports a good energy level and is working full time. He has not smoked for 16 weeks. He had a CT scan done on Monday. Denies swallowing issues.                            ALLERGIES:  is allergic to taxol and biafine.  Meds: Current Outpatient Prescriptions  Medication Sig Dispense Refill  . albuterol (PROVENTIL HFA;VENTOLIN HFA) 108 (90 BASE) MCG/ACT inhaler Inhale 2 puffs into the lungs every 6 (six) hours as needed for wheezing or shortness of breath. 1 Inhaler 6  . ALPRAZolam (XANAX) 0.25 MG tablet TAKE 1 TABLET AT BEDTIME AS NEEDED FOR ANXIETY 90 tablet 0  . Alum & Mag Hydroxide-Simeth (MAGIC MOUTHWASH W/LIDOCAINE) SOLN Take 5 mLs by mouth 3 (three) times daily as needed for mouth pain. Swish and spit 240 mL 0  . DPH-Lido-AlHydr-MgHydr-Simeth (FIRST-MOUTHWASH BLM) SUSP   0  . emollient (BIAFINE) cream Apply topically as needed.    . hyaluronate sodium (RADIAPLEXRX) GEL Apply 1 application topically once.    [name] HYDROcodone-acetaminophen (NORCO/VICODIN) 5-325 MG per tablet Take 1 tablet by mouth every 6 (six) hours as needed for moderate pain. (Patient not taking: Reported on 06/10/2014) 30 tablet 0  . mometasone-formoterol (DULERA) 200-5 MCG/ACT AERO Inhale 2 puffs into the lungs 2 (two) times daily. 1 Inhaler 6  . oxyCODONE-acetaminophen  (PERCOCET/ROXICET) 5-325 MG per tablet Take 1 tablet by mouth every 6 (six) hours as needed for severe pain. 30 tablet 0  . prochlorperazine (COMPAZINE) 10 MG tablet Take 1 tablet (10 mg total) by mouth every 6 (six) hours as needed for nausea or vomiting. 30 tablet 1  . prochlorperazine (COMPAZINE) 10 MG tablet TAKE 1 TABLET (10 MG TOTAL) BY MOUTH EVERY 6 (SIX) HOURS AS NEEDED FOR NAUSEA OR VOMITING. 30 tablet 0  . sucralfate (CARAFATE) 1 G tablet Take 1 tablet (1 g total) by mouth 4 (four) times daily -  with meals and at bedtime. 120 tablet 1  . tiotropium (SPIRIVA HANDIHALER) 18 MCG inhalation capsule PLACE 1 CAPSULE (18 MCG TOTAL) INTO INHALER AND INHALE DAILY. 30 capsule 11   No current facility-administered medications for this encounter.    Physical Findings: The patient is in no acute distress. Patient is alert and oriented.  BP 126/77 mmHg  Pulse 75  Temp(Src) 98.2 F (36.8 C) (Oral)  Resp 20  Ht '5\' 8"'$  (1.727 m)  Wt 234 lb 9.6 oz (106.414 kg)  BMI 35.68 kg/m2  SpO2 97%.  Lungs are clear. Heart has regular rate and rhythm. No palpable cervical, supraclavicular, or axillary adenopathy.  Lab Findings: Lab Results  Component Value Date   WBC 5.7 07/13/2014   HGB 12.8* 07/13/2014   HCT 37.9* 07/13/2014   MCV 93.7 07/13/2014  PLT 364 07/13/2014    Radiographic Findings: Ct Chest W Contrast  07/13/2014   CLINICAL DATA:  Followup squamous cell carcinoma of left lung. Evaluate after chemotherapy and radiation therapy, completed 07/08/2014. Ex-smoker. COPD. Left upper lobe primary.  EXAM: CT CHEST WITH CONTRAST  TECHNIQUE: Multidetector CT imaging of the chest was performed during intravenous contrast administration.  CONTRAST:  165m OMNIPAQUE IOHEXOL 300 MG/ML  SOLN  COMPARISON:  04/23/2014 PET.  04/07/2014 CT.  FINDINGS: Mediastinum/Nodes: Aortic and branch vessel atherosclerosis. Heart size upper normal, without pericardial effusion. No central pulmonary embolism, on this  non-dedicated study. LAD coronary artery atherosclerosis on image 30.  Stable small middle mediastinal nodes. None are pathologic by size criteria. Mild left gynecomastia.  Lungs/Pleura: No pleural fluid. Advanced centrilobular emphysema. Central left upper lobe lung mass with direct extension to the left hilum again identified. Significantly decreased in size. 1.8 x 3.9 cm on image 26 of series 2 versus 4.8 x 3.7 cm on 04/07/2014.  Mild motion degradation in the mid chest. The left upper lobe bronchus is better aerated, with persistent mass effect and probable obstruction within the anterior segmental bronchus.  Improved left upper lobe postobstructive pneumonitis with mild peripheral interstitial thickening remaining.  Subpleural 2 mm left lower lobe pulmonary nodules including on images 42 and 41 are similar.  A left lower lobe 5 mm nodule on image 46 is similar to on image 49 of the prior diagnostic CT.  Upper abdomen: mild hepatic steatosis. Normal imaged portions of the spleen, stomach, pancreas, adrenal glands, kidneys.  Musculoskeletal: Remote left rib trauma.  IMPRESSION: 1. Response to therapy of central left upper lobe lung mass with direct extension to the left hilum. Decreased mass effect upon left upper lobe bronchial tree with marked improvement in left upper lobe postobstructive pneumonitis. 2. No areas of new or progressive disease. 3. Advanced centrilobular emphysema 4. Age advanced coronary artery atherosclerosis. Recommend assessment of coronary risk factors and consideration of medical therapy. 5. Mild hepatic steatosis. 6. Nonspecific small pulmonary nodules are not significantly changed. 7. Left gynecomastia.   Electronically Signed   By: KAbigail MiyamotoM.D.   On: 07/13/2014 16:25    Impression:  The patient is recovering from the effects of radiation.  Clinically doing well, tumor has decreased in size on recent chest CT  Plan:  Will follow up with medical oncology today and then likely  referal to thoracic oncology for surgery.  This document serves as a record of services personally performed by JGery Pray MD. It was created on his behalf by LJeralene Peters a trained medical scribe. The creation of this record is based on the scribe's personal observations and the provider's statements to them. This document has been checked and approved by the attending provider.       ____________________________________ JBlair Promise PhD., MD

## 2014-07-16 NOTE — Progress Notes (Signed)
Vincent Sutton here for follow up.  He reports occasional burning pain in his left chest.  He denies having a cough, sore throat or trouble swallowing.  He reports his breathing is improved and is was able to walk in from the parking lot today.  He reports a good energy level and is working full time.  He has not smoked for 16 weeks.  He had a CT scan done on Monday.  BP 126/77 mmHg  Pulse 75  Temp(Src) 98.2 F (36.8 C) (Oral)  Resp 20  Ht '5\' 8"'$  (1.727 m)  Wt 234 lb 9.6 oz (106.414 kg)  BMI 35.68 kg/m2  SpO2 97%
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
## 2014-07-21 ENCOUNTER — Telehealth: Payer: Self-pay | Admitting: *Deleted

## 2014-07-21 NOTE — Telephone Encounter (Signed)
Oncology Nurse Navigator Documentation  Oncology Nurse Navigator Flowsheets 07/21/2014  Navigator Encounter Type 3 month  Patient Visit Type Surgery  Coordination of Care Other/Called patient to coordinate care with Dr. Roxan Hockey (T-Surgery).  Patient verbalized understanding of appt time and place.   Time Spent with Patient 15

## 2014-07-28 ENCOUNTER — Ambulatory Visit (INDEPENDENT_AMBULATORY_CARE_PROVIDER_SITE_OTHER): Payer: No Typology Code available for payment source | Admitting: Thoracic Surgery (Cardiothoracic Vascular Surgery)

## 2014-07-28 ENCOUNTER — Encounter: Payer: Self-pay | Admitting: Thoracic Surgery (Cardiothoracic Vascular Surgery)

## 2014-07-28 ENCOUNTER — Other Ambulatory Visit: Payer: Self-pay | Admitting: *Deleted

## 2014-07-28 VITALS — BP 138/86 | HR 68 | Resp 20 | Ht 68.0 in | Wt 234.0 lb

## 2014-07-28 DIAGNOSIS — C3412 Malignant neoplasm of upper lobe, left bronchus or lung: Secondary | ICD-10-CM

## 2014-07-28 DIAGNOSIS — C3492 Malignant neoplasm of unspecified part of left bronchus or lung: Secondary | ICD-10-CM
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
## 2014-08-03 ENCOUNTER — Encounter (HOSPITAL_COMMUNITY): Payer: Self-pay

## 2014-08-03 ENCOUNTER — Encounter (HOSPITAL_COMMUNITY)
Admission: RE | Admit: 2014-08-03 | Discharge: 2014-08-03 | Disposition: A | Discharge status: Home / Self Care | Payer: No Typology Code available for payment source | Source: Ambulatory Visit | Attending: Thoracic Surgery (Cardiothoracic Vascular Surgery) | Admitting: Thoracic Surgery (Cardiothoracic Vascular Surgery)

## 2014-08-03 ENCOUNTER — Ambulatory Visit (HOSPITAL_COMMUNITY)
Admission: RE | Admit: 2014-08-03 | Discharge: 2014-08-03 | Disposition: A | Discharge status: Home / Self Care | Payer: No Typology Code available for payment source | Source: Ambulatory Visit | Attending: Thoracic Surgery (Cardiothoracic Vascular Surgery) | Admitting: Thoracic Surgery (Cardiothoracic Vascular Surgery)

## 2014-08-03 ENCOUNTER — Other Ambulatory Visit: Payer: Self-pay

## 2014-08-03 VITALS — BP 134/76 | HR 66 | Temp 97.9°F | Resp 20 | Ht 68.0 in | Wt 237.3 lb

## 2014-08-03 DIAGNOSIS — C3412 Malignant neoplasm of upper lobe, left bronchus or lung: Secondary | ICD-10-CM

## 2014-08-03 HISTORY — DX: Sleep apnea, unspecified: G47.30

## 2014-08-03 LAB — BLOOD GAS, ARTERIAL
ACID-BASE EXCESS: 2 mmol/L (ref 0.0–2.0)
Bicarbonate: 26.7 mEq/L — ABNORMAL HIGH (ref 20.0–24.0)
Drawn by: 20517
FIO2: 0.21 %
O2 SAT: 94.1 %
PATIENT TEMPERATURE: 98.6
PO2 ART: 70.2 mmHg — AB (ref 80.0–100.0)
TCO2: 28.1 mmol/L (ref 0–100)
pCO2 arterial: 46.9 mmHg — ABNORMAL HIGH (ref 35.0–45.0)
pH, Arterial: 7.374 (ref 7.350–7.450)

## 2014-08-03 LAB — COMPREHENSIVE METABOLIC PANEL
ALK PHOS: 66 U/L (ref 38–126)
ALT: 30 U/L (ref 17–63)
AST: 24 U/L (ref 15–41)
Albumin: 4 g/dL (ref 3.5–5.0)
Anion gap: 6 (ref 5–15)
BILIRUBIN TOTAL: 0.4 mg/dL (ref 0.3–1.2)
BUN: 12 mg/dL (ref 6–20)
CO2: 29 mmol/L (ref 22–32)
Calcium: 9.3 mg/dL (ref 8.9–10.3)
Chloride: 103 mmol/L (ref 101–111)
Creatinine, Ser: 0.85 mg/dL (ref 0.61–1.24)
GLUCOSE: 159 mg/dL — AB (ref 65–99)
POTASSIUM: 3.6 mmol/L (ref 3.5–5.1)
Sodium: 138 mmol/L (ref 135–145)
Total Protein: 7.3 g/dL (ref 6.5–8.1)

## 2014-08-03 LAB — CBC
HEMATOCRIT: 39.8 % (ref 39.0–52.0)
Hemoglobin: 13.2 g/dL (ref 13.0–17.0)
MCH: 31.8 pg (ref 26.0–34.0)
MCHC: 33.2 g/dL (ref 30.0–36.0)
MCV: 95.9 fL (ref 78.0–100.0)
Platelets: 256 10*3/uL (ref 150–400)
RBC: 4.15 MIL/uL — ABNORMAL LOW (ref 4.22–5.81)
RDW: 16.5 % — AB (ref 11.5–15.5)
WBC: 5.7 10*3/uL (ref 4.0–10.5)

## 2014-08-03 LAB — PULMONARY FUNCTION TEST
DL/VA % PRED: 92 %
DL/VA: 4.1 ml/min/mmHg/L
DLCO COR: 18.29 ml/min/mmHg
DLCO UNC % PRED: 62 %
DLCO UNC: 17.75 ml/min/mmHg
DLCO cor % pred: 64 %
FEF 25-75 PRE: 0.52 L/s
FEF 25-75 Post: 0.48 L/sec
FEF2575-%Change-Post: -6 %
FEF2575-%Pred-Post: 16 %
FEF2575-%Pred-Pre: 17 %
FEV1-%Change-Post: 0 %
FEV1-%PRED-POST: 42 %
FEV1-%Pred-Pre: 42 %
FEV1-Post: 1.44 L
FEV1-Pre: 1.45 L
FEV1FVC-%CHANGE-POST: -1 %
FEV1FVC-%Pred-Pre: 67 %
FEV6-%Change-Post: -3 %
FEV6-%PRED-PRE: 60 %
FEV6-%Pred-Post: 58 %
FEV6-POST: 2.48 L
FEV6-Pre: 2.57 L
FEV6FVC-%CHANGE-POST: -4 %
FEV6FVC-%PRED-POST: 92 %
FEV6FVC-%Pred-Pre: 96 %
FVC-%Change-Post: 0 %
FVC-%PRED-PRE: 62 %
FVC-%Pred-Post: 62 %
FVC-Post: 2.79 L
FVC-Pre: 2.78 L
Post FEV1/FVC ratio: 52 %
Post FEV6/FVC ratio: 89 %
Pre FEV1/FVC ratio: 52 %
Pre FEV6/FVC Ratio: 93 %
RV % pred: 151 %
RV: 2.98 L
TLC % PRED: 97 %
TLC: 6.23 L

## 2014-08-03 LAB — URINALYSIS, ROUTINE W REFLEX MICROSCOPIC
BILIRUBIN URINE: NEGATIVE
Glucose, UA: NEGATIVE mg/dL
Hgb urine dipstick: NEGATIVE
KETONES UR: NEGATIVE mg/dL
LEUKOCYTES UA: NEGATIVE
Nitrite: NEGATIVE
Protein, ur: NEGATIVE mg/dL
SPECIFIC GRAVITY, URINE: 1.011 (ref 1.005–1.030)
Urobilinogen, UA: 0.2 mg/dL (ref 0.0–1.0)
pH: 5.5 (ref 5.0–8.0)

## 2014-08-03 LAB — SURGICAL PCR SCREEN
MRSA, PCR: NEGATIVE
STAPHYLOCOCCUS AUREUS: NEGATIVE

## 2014-08-03 LAB — TYPE AND SCREEN
ABO/RH(D): A POS
ANTIBODY SCREEN: NEGATIVE

## 2014-08-03 LAB — APTT: aPTT: 28 seconds (ref 24–37)

## 2014-08-03 LAB — ABO/RH: ABO/RH(D): A POS

## 2014-08-03 LAB — PROTIME-INR
INR: 1.02 (ref 0.00–1.49)
PROTHROMBIN TIME: 13.6 s (ref 11.6–15.2)

## 2014-08-03 MED ORDER — ALBUTEROL SULFATE (2.5 MG/3ML) 0.083% IN NEBU
2.5000 mg | INHALATION_SOLUTION | Freq: Once | RESPIRATORY_TRACT | Status: AC
  Administered 2014-08-03: 2.5 mg via RESPIRATORY_TRACT

## 2014-08-03 NOTE — Progress Notes (Signed)
Pt. Followed by Dr. Lenna Gilford, PCP,  Pt. Denies ever having any advanced cardiac studies, denies chest pain or any changes in his chest.

## 2014-08-03 NOTE — Pre-Procedure Instructions (Signed)
Vincent Sutton  08/03/2014      [id number] [place] [id number]:: [phone]:: [phone]   Your procedure is scheduled on 08/05/2014   [facility]   Call this number if you have problems the morning of surgery:  [phone]   Remember:   Do not eat food or drink liquids after midnight.  On Tuesday   Take these medicines the morning of surgery with A SIP OF WATER Spirivia, and in addition use inhaler- albuterol if you feel you need it.    Do not wear jewelry    Do not wear lotions, powders, or perfumes.  You may wear deodorant.              Men may shave face and neck.    Do not bring valuables to the hospital.   [facility].  Contacts, dentures or bridgework may not be worn into surgery.  Leave your suitcase in the car.  After surgery it may be brought to your room.  For patients admitted to the hospital, discharge time will be determined by your treatment team.  Patients discharged the day of surgery will not be allowed to drive home.   Name and phone number of your driver:   With spouse  Special instructions:  Special Instructions: [name] - Preparing for Surgery  Before surgery, you can play an important role.  Because skin is not sterile, your skin needs to be as free of germs as possible.  You can reduce the number of germs on you skin by washing with CHG (chlorahexidine gluconate) soap before surgery.  CHG is an antiseptic cleaner which kills germs and bonds with the skin to continue killing germs even after washing.  Please DO NOT use if you have an allergy to CHG or antibacterial soaps.  If your skin becomes reddened/irritated stop using the CHG and inform your nurse when you arrive at Short Stay.  Do not shave (including legs and underarms) for at least 48 hours prior to the first  CHG shower.  You may shave your face.  Please follow these instructions carefully:   1.  Shower with CHG Soap the night before surgery and the  morning of Surgery.  2.  If you choose to wash your hair, wash your hair first as usual with your  normal shampoo.  3.  After you shampoo, rinse your hair and body thoroughly to remove the  Shampoo.  4.  Use CHG as you would any other liquid soap.  You can apply chg directly to the skin and wash gently with scrungie or a clean washcloth.  5.  Apply the CHG Soap to your body ONLY FROM THE NECK DOWN.    Do not use on open wounds or open sores.  Avoid contact with your eyes, ears, mouth and genitals (private parts).  Wash genitals (private parts)   with your normal soap.  6.  Wash thoroughly, paying special attention to the area where your surgery will be performed.  7.  Thoroughly rinse your body with warm water from the neck down.  8.  DO NOT shower/wash with your normal soap after using and rinsing off   the CHG Soap.  9.  Pat yourself dry with a clean towel.            10.  Wear clean pajamas.            11.  Place clean sheets on your bed the night of your first shower and do not sleep with pets.  Day of Surgery  Do not apply any lotions/deodorants the morning of surgery.  Please wear clean clothes to the hospital/surgery center.   Please read over the following fact sheets that you were given. Pain Booklet, Coughing and Deep Breathing, Blood Transfusion Information, MRSA Information and Surgical Site Infection Prevention

## 2014-08-04 MED ORDER — DEXTROSE 5 % IV SOLN
1.5000 g | INTRAVENOUS | Status: AC
  Administered 2014-08-05: 1.5 g via INTRAVENOUS
  Filled 2014-08-04: qty 1.5

## 2014-08-05 ENCOUNTER — Inpatient Hospital Stay (HOSPITAL_COMMUNITY): Payer: No Typology Code available for payment source | Admitting: Certified Registered Nurse Anesthetist

## 2014-08-05 ENCOUNTER — Encounter (HOSPITAL_COMMUNITY): Payer: Self-pay | Admitting: Certified Registered Nurse Anesthetist

## 2014-08-05 ENCOUNTER — Inpatient Hospital Stay (HOSPITAL_COMMUNITY)
Admission: RE | Admit: 2014-08-05 | Discharge: 2014-08-12 | DRG: 163 | Disposition: A | Discharge status: Home / Self Care | Payer: No Typology Code available for payment source | Source: Ambulatory Visit | Attending: Thoracic Surgery (Cardiothoracic Vascular Surgery) | Admitting: Thoracic Surgery (Cardiothoracic Vascular Surgery)

## 2014-08-05 ENCOUNTER — Encounter (HOSPITAL_COMMUNITY)
Admission: RE | Disposition: A | Discharge status: Home / Self Care | Payer: Self-pay | Source: Ambulatory Visit | Attending: Thoracic Surgery (Cardiothoracic Vascular Surgery)

## 2014-08-05 ENCOUNTER — Inpatient Hospital Stay (HOSPITAL_COMMUNITY): Payer: No Typology Code available for payment source

## 2014-08-05 DIAGNOSIS — T17890A Other foreign object in other parts of respiratory tract causing asphyxiation, initial encounter: Secondary | ICD-10-CM | POA: Diagnosis not present

## 2014-08-05 DIAGNOSIS — K760 Fatty (change of) liver, not elsewhere classified: Secondary | ICD-10-CM | POA: Diagnosis present

## 2014-08-05 DIAGNOSIS — C3490 Malignant neoplasm of unspecified part of unspecified bronchus or lung: Secondary | ICD-10-CM

## 2014-08-05 DIAGNOSIS — Z87891 Personal history of nicotine dependence: Secondary | ICD-10-CM | POA: Diagnosis not present

## 2014-08-05 DIAGNOSIS — E872 Acidosis: Secondary | ICD-10-CM | POA: Diagnosis present

## 2014-08-05 DIAGNOSIS — Z9221 Personal history of antineoplastic chemotherapy: Secondary | ICD-10-CM

## 2014-08-05 DIAGNOSIS — Z902 Acquired absence of lung [part of]: Secondary | ICD-10-CM

## 2014-08-05 DIAGNOSIS — J9382 Other air leak: Secondary | ICD-10-CM | POA: Diagnosis not present

## 2014-08-05 DIAGNOSIS — J441 Chronic obstructive pulmonary disease with (acute) exacerbation: Secondary | ICD-10-CM | POA: Diagnosis not present

## 2014-08-05 DIAGNOSIS — E875 Hyperkalemia: Secondary | ICD-10-CM | POA: Diagnosis present

## 2014-08-05 DIAGNOSIS — G4733 Obstructive sleep apnea (adult) (pediatric): Secondary | ICD-10-CM | POA: Diagnosis present

## 2014-08-05 DIAGNOSIS — E119 Type 2 diabetes mellitus without complications: Secondary | ICD-10-CM | POA: Diagnosis not present

## 2014-08-05 DIAGNOSIS — N62 Hypertrophy of breast: Secondary | ICD-10-CM | POA: Diagnosis present

## 2014-08-05 DIAGNOSIS — E780 Pure hypercholesterolemia: Secondary | ICD-10-CM | POA: Diagnosis present

## 2014-08-05 DIAGNOSIS — J449 Chronic obstructive pulmonary disease, unspecified: Secondary | ICD-10-CM | POA: Diagnosis present

## 2014-08-05 DIAGNOSIS — Z9889 Other specified postprocedural states: Secondary | ICD-10-CM

## 2014-08-05 DIAGNOSIS — Z9119 Patient's noncompliance with other medical treatment and regimen: Secondary | ICD-10-CM | POA: Diagnosis present

## 2014-08-05 DIAGNOSIS — Z9689 Presence of other specified functional implants: Secondary | ICD-10-CM

## 2014-08-05 DIAGNOSIS — C3412 Malignant neoplasm of upper lobe, left bronchus or lung: Principal | ICD-10-CM | POA: Diagnosis present

## 2014-08-05 DIAGNOSIS — C3492 Malignant neoplasm of unspecified part of left bronchus or lung: Secondary | ICD-10-CM | POA: Diagnosis not present

## 2014-08-05 DIAGNOSIS — J432 Centrilobular emphysema: Secondary | ICD-10-CM | POA: Diagnosis not present

## 2014-08-05 DIAGNOSIS — Z923 Personal history of irradiation: Secondary | ICD-10-CM | POA: Diagnosis not present

## 2014-08-05 DIAGNOSIS — J9622 Acute and chronic respiratory failure with hypercapnia: Secondary | ICD-10-CM | POA: Diagnosis not present

## 2014-08-05 DIAGNOSIS — J9601 Acute respiratory failure with hypoxia: Secondary | ICD-10-CM | POA: Diagnosis not present

## 2014-08-05 DIAGNOSIS — J9811 Atelectasis: Secondary | ICD-10-CM | POA: Diagnosis not present

## 2014-08-05 DIAGNOSIS — R739 Hyperglycemia, unspecified: Secondary | ICD-10-CM | POA: Diagnosis present

## 2014-08-05 DIAGNOSIS — J9600 Acute respiratory failure, unspecified whether with hypoxia or hypercapnia: Secondary | ICD-10-CM | POA: Diagnosis present

## 2014-08-05 HISTORY — PX: VIDEO ASSISTED THORACOSCOPY (VATS)/THOROCOTOMY: SHX6173

## 2014-08-05 HISTORY — PX: VIDEO ASSISTED THORACOSCOPY (VATS)/ LOBECTOMY: SHX6169

## 2014-08-05 HISTORY — DX: Reserved for inherently not codable concepts without codable children: IMO0001

## 2014-08-05 HISTORY — DX: Anxiety disorder, unspecified: F41.9

## 2014-08-05 HISTORY — PX: LOBECTOMY: SHX5089

## 2014-08-05 LAB — BLOOD GAS, ARTERIAL
Acid-base deficit: 2 mmol/L (ref 0.0–2.0)
BICARBONATE: 24.6 meq/L — AB (ref 20.0–24.0)
Delivery systems: POSITIVE
Expiratory PAP: 6
FIO2: 0.4 %
Inspiratory PAP: 14
O2 Saturation: 94.1 %
PH ART: 7.232 — AB (ref 7.350–7.450)
PO2 ART: 85.7 mmHg (ref 80.0–100.0)
Patient temperature: 98.6
RATE: 6 resp/min
TCO2: 26.4 mmol/L (ref 0–100)
pCO2 arterial: 60.5 mmHg (ref 35.0–45.0)

## 2014-08-05 SURGERY — VIDEO ASSISTED THORACOSCOPY (VATS)/THOROCOTOMY
Anesthesia: General | Site: Chest | Laterality: Left

## 2014-08-05 MED ORDER — FENTANYL CITRATE (PF) 250 MCG/5ML IJ SOLN
INTRAMUSCULAR | Status: AC
  Filled 2014-08-05: qty 5

## 2014-08-05 MED ORDER — EPHEDRINE SULFATE 50 MG/ML IJ SOLN
INTRAMUSCULAR | Status: AC
  Filled 2014-08-05: qty 1

## 2014-08-05 MED ORDER — LEVALBUTEROL HCL 0.63 MG/3ML IN NEBU: 0.6300 mg | INHALATION_SOLUTION | Freq: Four times a day (QID) | RESPIRATORY_TRACT | Status: DC

## 2014-08-05 MED ORDER — LIDOCAINE HCL (CARDIAC) 20 MG/ML IV SOLN
INTRAVENOUS | Status: DC | PRN
  Administered 2014-08-05: 70 mg via INTRAVENOUS

## 2014-08-05 MED ORDER — VECURONIUM BROMIDE 10 MG IV SOLR
INTRAVENOUS | Status: AC
  Filled 2014-08-05: qty 10

## 2014-08-05 MED ORDER — NALOXONE HCL 0.4 MG/ML IJ SOLN: 0.4000 mg | INTRAMUSCULAR | Status: DC | PRN

## 2014-08-05 MED ORDER — ACETAMINOPHEN 160 MG/5ML PO SOLN
1000.0000 mg | Freq: Four times a day (QID) | ORAL | Status: AC
  Filled 2014-08-05: qty 40

## 2014-08-05 MED ORDER — HYDROMORPHONE HCL 1 MG/ML IJ SOLN
0.2500 mg | INTRAMUSCULAR | Status: DC | PRN
  Administered 2014-08-05 (×2): 0.5 mg via INTRAVENOUS

## 2014-08-05 MED ORDER — ROCURONIUM BROMIDE 50 MG/5ML IV SOLN
INTRAVENOUS | Status: AC
  Filled 2014-08-05: qty 1

## 2014-08-05 MED ORDER — PROPOFOL 10 MG/ML IV BOLUS
INTRAVENOUS | Status: AC
  Filled 2014-08-05: qty 20

## 2014-08-05 MED ORDER — DEXAMETHASONE SODIUM PHOSPHATE 4 MG/ML IJ SOLN
INTRAMUSCULAR | Status: DC | PRN
  Administered 2014-08-05: 8 mg via INTRAVENOUS

## 2014-08-05 MED ORDER — SENNOSIDES-DOCUSATE SODIUM 8.6-50 MG PO TABS
1.0000 | ORAL_TABLET | Freq: Every day | ORAL | Status: DC
  Administered 2014-08-07 – 2014-08-11 (×5): 1 via ORAL
  Filled 2014-08-05 (×8): qty 1

## 2014-08-05 MED ORDER — ONDANSETRON HCL 4 MG/2ML IJ SOLN
INTRAMUSCULAR | Status: AC
  Filled 2014-08-05: qty 2

## 2014-08-05 MED ORDER — SODIUM CHLORIDE 0.9 % IV SOLN
10.0000 mg | INTRAVENOUS | Status: DC | PRN
  Administered 2014-08-05: 10 ug/min via INTRAVENOUS

## 2014-08-05 MED ORDER — ROCURONIUM BROMIDE 100 MG/10ML IV SOLN
INTRAVENOUS | Status: DC | PRN
  Administered 2014-08-05: 50 mg via INTRAVENOUS

## 2014-08-05 MED ORDER — ONDANSETRON HCL 4 MG/2ML IJ SOLN
INTRAMUSCULAR | Status: DC | PRN
  Administered 2014-08-05: 4 mg via INTRAVENOUS

## 2014-08-05 MED ORDER — FENTANYL CITRATE (PF) 100 MCG/2ML IJ SOLN
INTRAMUSCULAR | Status: DC | PRN
Start: 2014-08-05 — End: 2014-08-05
  Administered 2014-08-05: 50 ug via INTRAVENOUS
  Administered 2014-08-05: 150 ug via INTRAVENOUS
  Administered 2014-08-05 (×3): 50 ug via INTRAVENOUS
  Administered 2014-08-05: 100 ug via INTRAVENOUS
  Administered 2014-08-05 (×2): 50 ug via INTRAVENOUS
  Administered 2014-08-05: 100 ug via INTRAVENOUS
  Administered 2014-08-05: 50 ug via INTRAVENOUS

## 2014-08-05 MED ORDER — FENTANYL CITRATE (PF) 100 MCG/2ML IJ SOLN
INTRAMUSCULAR | Status: AC
  Administered 2014-08-05: 100 ug
  Filled 2014-08-05: qty 2

## 2014-08-05 MED ORDER — GLYCOPYRROLATE 0.2 MG/ML IJ SOLN
INTRAMUSCULAR | Status: AC
  Filled 2014-08-05: qty 4

## 2014-08-05 MED ORDER — DIPHENHYDRAMINE HCL 12.5 MG/5ML PO ELIX
12.5000 mg | ORAL_SOLUTION | Freq: Four times a day (QID) | ORAL | Status: DC | PRN
  Filled 2014-08-05: qty 5

## 2014-08-05 MED ORDER — HYDROMORPHONE HCL 1 MG/ML IJ SOLN
INTRAMUSCULAR | Status: AC
  Filled 2014-08-05: qty 1

## 2014-08-05 MED ORDER — STERILE WATER FOR INJECTION IJ SOLN
INTRAMUSCULAR | Status: AC
  Filled 2014-08-05: qty 10

## 2014-08-05 MED ORDER — BUPIVACAINE ON-Q PAIN PUMP (FOR ORDER SET NO CHG)
INJECTION | Status: DC
  Filled 2014-08-05: qty 1

## 2014-08-05 MED ORDER — MIDAZOLAM HCL 2 MG/2ML IJ SOLN
INTRAMUSCULAR | Status: AC
  Filled 2014-08-05: qty 2

## 2014-08-05 MED ORDER — OXYCODONE HCL 5 MG PO TABS
5.0000 mg | ORAL_TABLET | ORAL | Status: DC | PRN
  Administered 2014-08-06 – 2014-08-08 (×7): 5 mg via ORAL
  Administered 2014-08-09 (×2): 10 mg via ORAL
  Administered 2014-08-09: 5 mg via ORAL
  Administered 2014-08-09 – 2014-08-11 (×6): 10 mg via ORAL
  Filled 2014-08-05: qty 1
  Filled 2014-08-05 (×3): qty 2
  Filled 2014-08-05: qty 1
  Filled 2014-08-05: qty 2
  Filled 2014-08-05: qty 1
  Filled 2014-08-05: qty 2
  Filled 2014-08-05 (×3): qty 1
  Filled 2014-08-05: qty 2
  Filled 2014-08-05: qty 1
  Filled 2014-08-05: qty 2
  Filled 2014-08-05: qty 1
  Filled 2014-08-05: qty 2

## 2014-08-05 MED ORDER — LEVALBUTEROL HCL 0.63 MG/3ML IN NEBU
0.6300 mg | INHALATION_SOLUTION | Freq: Four times a day (QID) | RESPIRATORY_TRACT | Status: DC
  Filled 2014-08-05 (×4): qty 3

## 2014-08-05 MED ORDER — HEMOSTATIC AGENTS (NO CHARGE) OPTIME
TOPICAL | Status: DC | PRN
  Administered 2014-08-05 (×2): 1 via TOPICAL

## 2014-08-05 MED ORDER — OXYCODONE HCL 5 MG/5ML PO SOLN: 5.0000 mg | Freq: Once | ORAL | Status: DC | PRN

## 2014-08-05 MED ORDER — KCL IN DEXTROSE-NACL 20-5-0.45 MEQ/L-%-% IV SOLN
INTRAVENOUS | Status: DC
  Administered 2014-08-05: 100 mL via INTRAVENOUS
  Filled 2014-08-05 (×3): qty 1000

## 2014-08-05 MED ORDER — DEXAMETHASONE SODIUM PHOSPHATE 4 MG/ML IJ SOLN
INTRAMUSCULAR | Status: AC
  Filled 2014-08-05: qty 2

## 2014-08-05 MED ORDER — SODIUM CHLORIDE 0.9 % IJ SOLN
INTRAMUSCULAR | Status: AC
  Filled 2014-08-05: qty 20

## 2014-08-05 MED ORDER — CHLORHEXIDINE GLUCONATE 0.12 % MT SOLN
15.0000 mL | Freq: Two times a day (BID) | OROMUCOSAL | Status: DC
  Administered 2014-08-06 – 2014-08-07 (×3): 15 mL via OROMUCOSAL
  Filled 2014-08-05 (×3): qty 15

## 2014-08-05 MED ORDER — SODIUM BICARBONATE 8.4 % IV SOLN
50.0000 meq | Freq: Once | INTRAVENOUS | Status: AC
  Administered 2014-08-05: 50 meq via INTRAVENOUS

## 2014-08-05 MED ORDER — ONDANSETRON HCL 4 MG/2ML IJ SOLN
4.0000 mg | Freq: Four times a day (QID) | INTRAMUSCULAR | Status: DC | PRN
  Filled 2014-08-05: qty 2

## 2014-08-05 MED ORDER — NEOSTIGMINE METHYLSULFATE 10 MG/10ML IV SOLN
INTRAVENOUS | Status: AC
  Filled 2014-08-05: qty 1

## 2014-08-05 MED ORDER — TRAMADOL HCL 50 MG PO TABS
50.0000 mg | ORAL_TABLET | Freq: Four times a day (QID) | ORAL | Status: DC | PRN
  Administered 2014-08-06: 50 mg via ORAL
  Administered 2014-08-08 – 2014-08-09 (×3): 100 mg via ORAL
  Filled 2014-08-05: qty 1
  Filled 2014-08-05 (×2): qty 2
  Filled 2014-08-05: qty 1
  Filled 2014-08-05: qty 2

## 2014-08-05 MED ORDER — POTASSIUM CHLORIDE 10 MEQ/50ML IV SOLN
10.0000 meq | Freq: Every day | INTRAVENOUS | Status: DC | PRN
  Filled 2014-08-05: qty 50

## 2014-08-05 MED ORDER — DIPHENHYDRAMINE HCL 50 MG/ML IJ SOLN: 12.5000 mg | Freq: Four times a day (QID) | INTRAMUSCULAR | Status: DC | PRN

## 2014-08-05 MED ORDER — NEOSTIGMINE METHYLSULFATE 10 MG/10ML IV SOLN
INTRAVENOUS | Status: DC | PRN
  Administered 2014-08-05: 5 mg via INTRAVENOUS

## 2014-08-05 MED ORDER — ALBUTEROL SULFATE (2.5 MG/3ML) 0.083% IN NEBU
2.5000 mg | INHALATION_SOLUTION | Freq: Four times a day (QID) | RESPIRATORY_TRACT | Status: DC | PRN
  Administered 2014-08-06: 2.5 mg via RESPIRATORY_TRACT
  Filled 2014-08-05: qty 3

## 2014-08-05 MED ORDER — ONDANSETRON HCL 4 MG/2ML IJ SOLN
4.0000 mg | Freq: Four times a day (QID) | INTRAMUSCULAR | Status: DC | PRN
  Administered 2014-08-06 – 2014-08-07 (×2): 4 mg via INTRAVENOUS
  Filled 2014-08-05: qty 2

## 2014-08-05 MED ORDER — PROPOFOL 10 MG/ML IV BOLUS
INTRAVENOUS | Status: DC | PRN
  Administered 2014-08-05: 30 mg via INTRAVENOUS
  Administered 2014-08-05: 40 mg via INTRAVENOUS
  Administered 2014-08-05: 70 mg via INTRAVENOUS

## 2014-08-05 MED ORDER — ACETAMINOPHEN 500 MG PO TABS
1000.0000 mg | ORAL_TABLET | Freq: Four times a day (QID) | ORAL | Status: AC
  Administered 2014-08-06 – 2014-08-10 (×18): 1000 mg via ORAL
  Filled 2014-08-05 (×19): qty 2

## 2014-08-05 MED ORDER — CETYLPYRIDINIUM CHLORIDE 0.05 % MT LIQD
7.0000 mL | Freq: Two times a day (BID) | OROMUCOSAL | Status: DC
  Administered 2014-08-06 – 2014-08-07 (×4): 7 mL via OROMUCOSAL

## 2014-08-05 MED ORDER — VECURONIUM BROMIDE 10 MG IV SOLR
INTRAVENOUS | Status: DC | PRN
  Administered 2014-08-05 (×3): 2 mg via INTRAVENOUS
  Administered 2014-08-05: 3 mg via INTRAVENOUS
  Administered 2014-08-05 (×4): 2 mg via INTRAVENOUS

## 2014-08-05 MED ORDER — SODIUM CHLORIDE 0.9 % IJ SOLN: 9.0000 mL | INTRAMUSCULAR | Status: DC | PRN

## 2014-08-05 MED ORDER — ALPRAZOLAM 0.25 MG PO TABS: 0.2500 mg | ORAL_TABLET | Freq: Every evening | ORAL | Status: DC | PRN

## 2014-08-05 MED ORDER — FENTANYL 10 MCG/ML IV SOLN
INTRAVENOUS | Status: DC
  Administered 2014-08-05: 19:00:00 via INTRAVENOUS
  Administered 2014-08-05: 120 ug via INTRAVENOUS
  Administered 2014-08-06: 175 ug via INTRAVENOUS
  Administered 2014-08-06: 195 ug via INTRAVENOUS
  Administered 2014-08-06: 210 ug via INTRAVENOUS
  Administered 2014-08-06: 130 ug via INTRAVENOUS
  Administered 2014-08-06: 90 ug via INTRAVENOUS
  Administered 2014-08-06: 02:00:00 via INTRAVENOUS
  Administered 2014-08-07: 45 ug via INTRAVENOUS
  Administered 2014-08-07: 75 ug via INTRAVENOUS
  Filled 2014-08-05 (×4): qty 50

## 2014-08-05 MED ORDER — BUPIVACAINE 0.5 % ON-Q PUMP SINGLE CATH 400 ML
400.0000 mL | INJECTION | Status: DC
  Filled 2014-08-05: qty 400

## 2014-08-05 MED ORDER — 0.9 % SODIUM CHLORIDE (POUR BTL) OPTIME
TOPICAL | Status: DC | PRN
  Administered 2014-08-05: 3000 mL

## 2014-08-05 MED ORDER — BUPIVACAINE HCL (PF) 0.5 % IJ SOLN
INTRAMUSCULAR | Status: AC
  Filled 2014-08-05: qty 10

## 2014-08-05 MED ORDER — BISACODYL 5 MG PO TBEC
10.0000 mg | DELAYED_RELEASE_TABLET | Freq: Every day | ORAL | Status: DC
  Administered 2014-08-06 – 2014-08-09 (×4): 10 mg via ORAL
  Filled 2014-08-05 (×5): qty 2

## 2014-08-05 MED ORDER — ALBUTEROL SULFATE HFA 108 (90 BASE) MCG/ACT IN AERS
INHALATION_SPRAY | RESPIRATORY_TRACT | Status: AC
  Filled 2014-08-05: qty 6.7

## 2014-08-05 MED ORDER — LEVALBUTEROL HCL 0.63 MG/3ML IN NEBU
INHALATION_SOLUTION | RESPIRATORY_TRACT | Status: AC
  Administered 2014-08-05: 0.63 mg
  Filled 2014-08-05: qty 3

## 2014-08-05 MED ORDER — LACTATED RINGERS IV SOLN
INTRAVENOUS | Status: DC
  Administered 2014-08-05 (×2): via INTRAVENOUS

## 2014-08-05 MED ORDER — SUCCINYLCHOLINE CHLORIDE 20 MG/ML IJ SOLN
INTRAMUSCULAR | Status: AC
  Filled 2014-08-05: qty 1

## 2014-08-05 MED ORDER — OXYCODONE HCL 5 MG PO TABS: 5.0000 mg | ORAL_TABLET | Freq: Once | ORAL | Status: DC | PRN

## 2014-08-05 MED ORDER — MIDAZOLAM HCL 2 MG/2ML IJ SOLN
INTRAMUSCULAR | Status: AC
  Administered 2014-08-05: 2 mg
  Filled 2014-08-05: qty 2

## 2014-08-05 MED ORDER — LIDOCAINE HCL (CARDIAC) 20 MG/ML IV SOLN
INTRAVENOUS | Status: AC
  Filled 2014-08-05: qty 5

## 2014-08-05 MED ORDER — GLYCOPYRROLATE 0.2 MG/ML IJ SOLN
INTRAMUSCULAR | Status: DC | PRN
  Administered 2014-08-05: .8 mg via INTRAVENOUS

## 2014-08-05 MED ORDER — TIOTROPIUM BROMIDE MONOHYDRATE 18 MCG IN CAPS
18.0000 ug | ORAL_CAPSULE | Freq: Every day | RESPIRATORY_TRACT | Status: DC
  Filled 2014-08-05: qty 5

## 2014-08-05 MED ORDER — DEXTROSE 5 % IV SOLN
1.5000 g | Freq: Two times a day (BID) | INTRAVENOUS | Status: AC
  Administered 2014-08-06 (×2): 1.5 g via INTRAVENOUS
  Filled 2014-08-05 (×2): qty 1.5

## 2014-08-05 MED ORDER — LACTATED RINGERS IV SOLN
INTRAVENOUS | Status: DC | PRN
  Administered 2014-08-05: 11:00:00 via INTRAVENOUS

## 2014-08-05 MED ORDER — BUPIVACAINE HCL (PF) 0.5 % IJ SOLN
INTRAMUSCULAR | Status: DC | PRN
Start: 2014-08-05 — End: 2014-08-05
  Administered 2014-08-05: 10 mL

## 2014-08-05 SURGICAL SUPPLY — 93 items
ADH SKN CLS APL DERMABOND .7 (GAUZE/BANDAGES/DRESSINGS) ×2
APPLIER CLIP 5 13 M/L LIGAMAX5 (MISCELLANEOUS) ×3
APPLIER CLIP LOGIC TI 5 (MISCELLANEOUS) ×1 IMPLANT
APPLIER CLIP ROT 10 11.4 M/L (STAPLE)
APR CLP MED LRG 11.4X10 (STAPLE)
APR CLP MED LRG 33X5 (MISCELLANEOUS) ×2
APR CLP MED LRG 5 ANG JAW (MISCELLANEOUS) ×2
BAG SPEC RTRVL LRG 6X4 10 (ENDOMECHANICALS)
CANISTER SUCTION 2500CC (MISCELLANEOUS) ×3 IMPLANT
CATH KIT ON Q 5IN SLV (PAIN MANAGEMENT) IMPLANT
CATH KIT ON-Q SILVERSOAK 5 (CATHETERS) IMPLANT
CATH KIT ON-Q SILVERSOAK 5IN (CATHETERS) ×3 IMPLANT
CATH THORACIC 28FR (CATHETERS) ×1 IMPLANT
CATH THORACIC 28FR RT ANG (CATHETERS) IMPLANT
CATH THORACIC 36FR (CATHETERS) IMPLANT
CATH THORACIC 36FR RT ANG (CATHETERS) IMPLANT
CLIP APPLIE 5 13 M/L LIGAMAX5 (MISCELLANEOUS) IMPLANT
CLIP APPLIE ROT 10 11.4 M/L (STAPLE) IMPLANT
CLIP TI MEDIUM 6 (CLIP) ×3 IMPLANT
CONN ST 1/4X3/8  BEN (MISCELLANEOUS) ×1
CONN ST 1/4X3/8 BEN (MISCELLANEOUS) IMPLANT
CONN Y 3/8X3/8X3/8  BEN (MISCELLANEOUS)
CONN Y 3/8X3/8X3/8 BEN (MISCELLANEOUS) IMPLANT
CONT SPEC 4OZ CLIKSEAL STRL BL (MISCELLANEOUS) ×8 IMPLANT
CUTTER ECHEON FLEX ENDO 45 340 (ENDOMECHANICALS) ×1 IMPLANT
DERMABOND ADVANCED (GAUZE/BANDAGES/DRESSINGS) ×1
DERMABOND ADVANCED .7 DNX12 (GAUZE/BANDAGES/DRESSINGS) IMPLANT
DRAIN CHANNEL 28F RND 3/8 FF (WOUND CARE) ×1 IMPLANT
DRAPE LAPAROSCOPIC ABDOMINAL (DRAPES) ×3 IMPLANT
DRAPE WARM FLUID 44X44 (DRAPE) ×3 IMPLANT
ELECT BLADE 6.5 EXT (BLADE) ×3 IMPLANT
ELECT REM PT RETURN 9FT ADLT (ELECTROSURGICAL) ×3
ELECTRODE REM PT RTRN 9FT ADLT (ELECTROSURGICAL) ×2 IMPLANT
GAUZE SPONGE 4X4 12PLY STRL (GAUZE/BANDAGES/DRESSINGS) ×3 IMPLANT
GLOVE SURG SIGNA 7.5 PF LTX (GLOVE) ×6 IMPLANT
GOWN STRL REUS W/ TWL LRG LVL3 (GOWN DISPOSABLE) ×4 IMPLANT
GOWN STRL REUS W/ TWL XL LVL3 (GOWN DISPOSABLE) ×2 IMPLANT
GOWN STRL REUS W/TWL LRG LVL3 (GOWN DISPOSABLE) ×6
GOWN STRL REUS W/TWL XL LVL3 (GOWN DISPOSABLE) ×3
HEMOSTAT SURGICEL 2X14 (HEMOSTASIS) ×1 IMPLANT
KIT BASIN OR (CUSTOM PROCEDURE TRAY) ×3 IMPLANT
KIT ROOM TURNOVER OR (KITS) ×3 IMPLANT
KIT SUCTION CATH 14FR (SUCTIONS) ×3 IMPLANT
LIQUID BAND (GAUZE/BANDAGES/DRESSINGS) ×1 IMPLANT
NS IRRIG 1000ML POUR BTL (IV SOLUTION) ×12 IMPLANT
PACK CHEST (CUSTOM PROCEDURE TRAY) ×3 IMPLANT
PAD ARMBOARD 7.5X6 YLW CONV (MISCELLANEOUS) ×6 IMPLANT
POUCH ENDO CATCH II 15MM (MISCELLANEOUS) ×1 IMPLANT
POUCH SPECIMEN RETRIEVAL 10MM (ENDOMECHANICALS) IMPLANT
RELOAD GOLD ECHELON 45 (STAPLE) ×11 IMPLANT
RELOAD GREEN ECHELON 45 (STAPLE) ×1 IMPLANT
RELOAD STAPLE 35X2.5 WHT THIN (STAPLE) IMPLANT
SCISSORS ENDO CVD 5DCS (MISCELLANEOUS) IMPLANT
SCISSORS LAP 5X35 DISP (ENDOMECHANICALS) ×1 IMPLANT
SEALANT PROGEL (MISCELLANEOUS) ×1 IMPLANT
SEALANT SURG COSEAL 4ML (VASCULAR PRODUCTS) IMPLANT
SEALANT SURG COSEAL 8ML (VASCULAR PRODUCTS) IMPLANT
SOLUTION ANTI FOG 6CC (MISCELLANEOUS) ×3 IMPLANT
SPONGE INTESTINAL PEANUT (DISPOSABLE) ×3 IMPLANT
SPONGE TONSIL 1 RF SGL (DISPOSABLE) ×1 IMPLANT
STAPLE RELOAD 2.5MM WHITE (STAPLE) ×24 IMPLANT
STAPLER VASCULAR ECHELON 35 (CUTTER) ×1 IMPLANT
SUT PROLENE 4 0 RB 1 (SUTURE) ×3
SUT PROLENE 4-0 RB1 .5 CRCL 36 (SUTURE) IMPLANT
SUT SILK  1 MH (SUTURE) ×2
SUT SILK 1 MH (SUTURE) ×4 IMPLANT
SUT SILK 1 TIES 10X30 (SUTURE) ×3 IMPLANT
SUT SILK 2 0 SH (SUTURE) IMPLANT
SUT SILK 2 0SH CR/8 30 (SUTURE) IMPLANT
SUT SILK 3 0 SH 30 (SUTURE) IMPLANT
SUT SILK 3 0SH CR/8 30 (SUTURE) ×1 IMPLANT
SUT VIC AB 1 CTX 36 (SUTURE) ×3
SUT VIC AB 1 CTX36XBRD ANBCTR (SUTURE) IMPLANT
SUT VIC AB 2-0 CTX 36 (SUTURE) ×1 IMPLANT
SUT VIC AB 2-0 UR6 27 (SUTURE) IMPLANT
SUT VIC AB 3-0 MH 27 (SUTURE) IMPLANT
SUT VIC AB 3-0 X1 27 (SUTURE) ×7 IMPLANT
SUT VICRYL 2 TP 1 (SUTURE) IMPLANT
SWAB COLLECTION DEVICE MRSA (MISCELLANEOUS) IMPLANT
SYSTEM SAHARA CHEST DRAIN ATS (WOUND CARE) ×3 IMPLANT
TAPE CLOTH 4X10 WHT NS (GAUZE/BANDAGES/DRESSINGS) ×3 IMPLANT
TAPE CLOTH SURG 4X10 WHT LF (GAUZE/BANDAGES/DRESSINGS) ×1 IMPLANT
TIP APPLICATOR SPRAY EXTEND 16 (VASCULAR PRODUCTS) ×1 IMPLANT
TOWEL OR 17X24 6PK STRL BLUE (TOWEL DISPOSABLE) ×3 IMPLANT
TOWEL OR 17X26 10 PK STRL BLUE (TOWEL DISPOSABLE) ×3 IMPLANT
TRAP SPECIMEN MUCOUS 40CC (MISCELLANEOUS) IMPLANT
TRAY FOLEY CATH 16FRSI W/METER (SET/KITS/TRAYS/PACK) ×3 IMPLANT
TROCAR FLEXIPATH THORACIC 15MM (ENDOMECHANICALS) ×1 IMPLANT
TROCAR XCEL BLADELESS 5X75MML (TROCAR) ×3 IMPLANT
TUBE ANAEROBIC SPECIMEN COL (MISCELLANEOUS) IMPLANT
TUNNELER SHEATH ON-Q 11GX8 DSP (PAIN MANAGEMENT) ×1 IMPLANT
TUNNELER SHEATH ON-Q 16GX12 DP (PAIN MANAGEMENT) IMPLANT
WATER STERILE IRR 1000ML POUR (IV SOLUTION) ×6 IMPLANT
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
## 2014-08-05 NOTE — Transfer of Care (Signed)
Immediate Anesthesia Transfer of Care Note  Patient: [place]  Procedure(s) Performed: Procedure(s): VIDEO ASSISTED THORACOSCOPY (VATS) (Left) LEFT UPPER LOBECTOMY (Left)  Patient Location: PACU  Anesthesia Type:General  Level of Consciousness: lethargic and responds to stimulation  Airway & Oxygen Therapy: Patient Spontanous Breathing and Patient connected to face mask oxygen  Post-op Assessment: Report given to RN  Post vital signs: Reviewed and stable  Last Vitals:  Filed Vitals:   08/05/14 0938  BP: 131/84  Pulse: 71  Temp: 36.7 C  Resp: 20    Complications: No apparent anesthesia complications

## 2014-08-05 NOTE — Anesthesia Procedure Notes (Addendum)
Procedure Name: Intubation Date/Time: 08/05/2014 11:24 AM Performed by: Rogers Blocker Pre-anesthesia Checklist: Patient identified, Timeout performed, Emergency Drugs available, Suction available and Patient being monitored Patient Re-evaluated:Patient Re-evaluated prior to inductionOxygen Delivery Method: Circle system utilized Preoxygenation: Pre-oxygenation with 100% oxygen Intubation Type: IV induction Ventilation: Mask ventilation without difficulty Laryngoscope Size: Mac and 4 Grade View: Grade I Tube type: Oral Endobronchial tube: Double lumen EBT and 39 Fr Number of attempts: 1 Airway Equipment and Method: Stylet Placement Confirmation: ETT inserted through vocal cords under direct vision,  positive ETCO2,  CO2 detector and breath sounds checked- equal and bilateral Tube secured with: Tape Dental Injury: Teeth and Oropharynx as per pre-operative assessment

## 2014-08-05 NOTE — Anesthesia Preprocedure Evaluation (Addendum)
Anesthesia Evaluation  Patient identified by MRN, date of birth, ID band Patient awake    Reviewed: Allergy & Precautions, NPO status , Patient's Chart, lab work & pertinent test results  History of Anesthesia Complications Negative for: history of anesthetic complications  Airway Mallampati: IV  TM Distance: <3 FB Neck ROM: Full    Dental no notable dental hx. (+) Upper Dentures   Pulmonary sleep apnea and Continuous Positive Airway Pressure Ventilation , COPD COPD inhaler, former smoker,  breath sounds clear to auscultation  Pulmonary exam normal       Cardiovascular negative cardio ROS Normal cardiovascular examRhythm:Regular Rate:Normal     Neuro/Psych negative neurological ROS  negative psych ROS   GI/Hepatic negative GI ROS, Neg liver ROS,   Endo/Other  negative endocrine ROS  Renal/GU negative Renal ROS     Musculoskeletal  (+) Arthritis -,   Abdominal   Peds  Hematology negative hematology ROS (+)   Anesthesia Other Findings   Reproductive/Obstetrics negative OB ROS                           Anesthesia Physical Anesthesia Plan  ASA: III  Anesthesia Plan: General   Post-op Pain Management:    Induction: Intravenous  Airway Management Planned: Double Lumen EBT  Additional Equipment: Arterial line and CVP  Intra-op Plan:   Post-operative Plan: Extubation in OR and Possible Post-op intubation/ventilation  Informed Consent: I have reviewed the patients History and Physical, chart, labs and discussed the procedure including the risks, benefits and alternatives for the proposed anesthesia with the patient or authorized representative who has indicated his/her understanding and acceptance.   Dental advisory given  Plan Discussed with: CRNA and Surgeon  Anesthesia Plan Comments:        Anesthesia Quick Evaluation

## 2014-08-05 NOTE — Brief Op Note (Addendum)
08/05/2014  4:15 PM  PATIENT:  Vincent Sutton  54 y.o. male  PRE-OPERATIVE DIAGNOSIS:  LUL SQUAMOUS CELL CARCINOMA  POST-OPERATIVE DIAGNOSIS:  LUL SQUAMOUS CELL CARCINOMA  PROCEDURE:   LEFT VIDEO ASSISTED THORACOSCOPY (VATS) THORACOSCOPIC LEFT UPPER LOBECTOMY LYMPH NODE SAMPLING  SURGEON:  Surgeon(s): [name], MD  ASSISTANT: Suzzanne Cloud, PA-C  ANESTHESIA:   general  SPECIMEN:  Source of Specimen:  left upper lobe, lymph nodes  DISPOSITION OF SPECIMEN:  Pathology  DRAINS: 72 Blake drain, 28 Fr CT  PATIENT CONDITION:  PACU - hemodynamically stable.  FINDINGS Extensive adhesions. Bronchial margin free of tumor

## 2014-08-05 NOTE — Interval H&P Note (Signed)
History and Physical Interval Note:  FEV1 1.44 on repeat PFTs  08/05/2014 10:40 AM  Vincent Sutton  has presented today for surgery, with the diagnosis of LUL CANCER  The various methods of treatment have been discussed with the patient and family. After consideration of risks, benefits and other options for treatment, the patient has consented to  Procedure(s): VIDEO ASSISTED THORACOSCOPY (VATS)/POSSIBLE THOROCOTOMY (Left) LEFT UPPER LOBECTOMY (Left) as a surgical intervention .  The patient's history has been reviewed, patient examined, no change in status, stable for surgery.  I have reviewed the patient's chart and labs.  Questions were answered to the patient's satisfaction.     [name]

## 2014-08-06 ENCOUNTER — Encounter (HOSPITAL_COMMUNITY): Payer: Self-pay | Admitting: General Practice

## 2014-08-06 ENCOUNTER — Inpatient Hospital Stay (HOSPITAL_COMMUNITY): Payer: No Typology Code available for payment source

## 2014-08-06 DIAGNOSIS — E119 Type 2 diabetes mellitus without complications: Secondary | ICD-10-CM

## 2014-08-06 DIAGNOSIS — C3492 Malignant neoplasm of unspecified part of left bronchus or lung: Secondary | ICD-10-CM

## 2014-08-06 DIAGNOSIS — J9601 Acute respiratory failure with hypoxia: Secondary | ICD-10-CM

## 2014-08-06 LAB — POCT I-STAT 3, ART BLOOD GAS (G3+)
ACID-BASE DEFICIT: 2 mmol/L (ref 0.0–2.0)
Acid-base deficit: 4 mmol/L — ABNORMAL HIGH (ref 0.0–2.0)
Acid-base deficit: 3 mmol/L — ABNORMAL HIGH (ref 0.0–2.0)
Acid-base deficit: 3 mmol/L — ABNORMAL HIGH (ref 0.0–2.0)
BICARBONATE: 25.3 meq/L — AB (ref 20.0–24.0)
Bicarbonate: 26.8 mEq/L — ABNORMAL HIGH (ref 20.0–24.0)
Bicarbonate: 26.5 mEq/L — ABNORMAL HIGH (ref 20.0–24.0)
Bicarbonate: 26.8 mEq/L — ABNORMAL HIGH (ref 20.0–24.0)
Bicarbonate: 29 mEq/L — ABNORMAL HIGH (ref 20.0–24.0)
O2 SAT: 96 %
O2 SAT: 98 %
O2 SAT: 95 %
O2 Saturation: 96 %
O2 Saturation: 97 %
PCO2 ART: 64.4 mmHg — AB (ref 35.0–45.0)
PH ART: 7.195 — AB (ref 7.350–7.450)
PH ART: 7.226 — AB (ref 7.350–7.450)
PH ART: 7.243 — AB (ref 7.350–7.450)
PH ART: 7.266 — AB (ref 7.350–7.450)
PO2 ART: 123 mmHg — AB (ref 80.0–100.0)
PO2 ART: 100 mmHg (ref 80.0–100.0)
Patient temperature: 97.9
Patient temperature: 98.4
Patient temperature: 98.4
TCO2: 27 mmol/L (ref 0–100)
TCO2: 29 mmol/L (ref 0–100)
TCO2: 28 mmol/L (ref 0–100)
TCO2: 29 mmol/L (ref 0–100)
TCO2: 31 mmol/L (ref 0–100)
pCO2 arterial: 65.2 mmHg (ref 35.0–45.0)
pCO2 arterial: 61.3 mmHg (ref 35.0–45.0)
pCO2 arterial: 66.9 mmHg (ref 35.0–45.0)
pCO2 arterial: 63.3 mmHg (ref 35.0–45.0)
pH, Arterial: 7.208 — ABNORMAL LOW (ref 7.350–7.450)
pO2, Arterial: 105 mmHg — ABNORMAL HIGH (ref 80.0–100.0)
pO2, Arterial: 103 mmHg — ABNORMAL HIGH (ref 80.0–100.0)
pO2, Arterial: 92 mmHg (ref 80.0–100.0)

## 2014-08-06 LAB — POCT I-STAT 7, (LYTES, BLD GAS, ICA,H+H)
Acid-base deficit: 5 mmol/L — ABNORMAL HIGH (ref 0.0–2.0)
Bicarbonate: 21.9 mEq/L (ref 20.0–24.0)
CALCIUM ION: 1.1 mmol/L — AB (ref 1.12–1.23)
HEMATOCRIT: 35 % — AB (ref 39.0–52.0)
HEMOGLOBIN: 11.9 g/dL — AB (ref 13.0–17.0)
O2 Saturation: 100 %
PH ART: 7.29 — AB (ref 7.350–7.450)
PO2 ART: 231 mmHg — AB (ref 80.0–100.0)
POTASSIUM: 4.2 mmol/L (ref 3.5–5.1)
Sodium: 140 mmol/L (ref 135–145)
TCO2: 23 mmol/L (ref 0–100)
pCO2 arterial: 45.2 mmHg — ABNORMAL HIGH (ref 35.0–45.0)

## 2014-08-06 LAB — CBC
HEMATOCRIT: 40.5 % (ref 39.0–52.0)
Hemoglobin: 13.5 g/dL (ref 13.0–17.0)
MCH: 32.4 pg (ref 26.0–34.0)
MCHC: 33.3 g/dL (ref 30.0–36.0)
MCV: 97.1 fL (ref 78.0–100.0)
PLATELETS: 306 10*3/uL (ref 150–400)
RBC: 4.17 MIL/uL — ABNORMAL LOW (ref 4.22–5.81)
RDW: 16.8 % — AB (ref 11.5–15.5)
WBC: 19.6 10*3/uL — ABNORMAL HIGH (ref 4.0–10.5)

## 2014-08-06 LAB — BASIC METABOLIC PANEL
Anion gap: 9 (ref 5–15)
BUN: 19 mg/dL (ref 6–20)
CALCIUM: 8.7 mg/dL — AB (ref 8.9–10.3)
CO2: 27 mmol/L (ref 22–32)
Chloride: 101 mmol/L (ref 101–111)
Creatinine, Ser: 1.08 mg/dL (ref 0.61–1.24)
GFR calc Af Amer: >60 mL/min (ref >60)
Glucose, Bld: 184 mg/dL — ABNORMAL HIGH (ref 65–99)
Potassium: 5 mmol/L (ref 3.5–5.1)
SODIUM: 137 mmol/L (ref 135–145)

## 2014-08-06 MED ORDER — KETOROLAC TROMETHAMINE 30 MG/ML IJ SOLN
30.0000 mg | Freq: Four times a day (QID) | INTRAMUSCULAR | Status: AC
  Administered 2014-08-06 – 2014-08-07 (×4): 30 mg via INTRAVENOUS
  Filled 2014-08-06 (×4): qty 1

## 2014-08-06 MED ORDER — ALBUTEROL SULFATE (2.5 MG/3ML) 0.083% IN NEBU: 2.5000 mg | INHALATION_SOLUTION | RESPIRATORY_TRACT | Status: DC

## 2014-08-06 MED ORDER — ENOXAPARIN SODIUM 40 MG/0.4ML ~~LOC~~ SOLN
40.0000 mg | SUBCUTANEOUS | Status: DC
  Administered 2014-08-06 – 2014-08-12 (×7): 40 mg via SUBCUTANEOUS
  Filled 2014-08-06 (×8): qty 0.4

## 2014-08-06 MED ORDER — SODIUM BICARBONATE 8.4 % IV SOLN
50.0000 meq | Freq: Once | INTRAVENOUS | Status: AC
  Administered 2014-08-06: 50 meq via INTRAVENOUS

## 2014-08-06 MED ORDER — IPRATROPIUM-ALBUTEROL 0.5-2.5 (3) MG/3ML IN SOLN
3.0000 mL | Freq: Four times a day (QID) | RESPIRATORY_TRACT | Status: DC
  Administered 2014-08-06 – 2014-08-08 (×8): 3 mL via RESPIRATORY_TRACT
  Filled 2014-08-06 (×2): qty 3
  Filled 2014-08-06: qty 36
  Filled 2014-08-06 (×5): qty 3

## 2014-08-06 MED ORDER — SODIUM CHLORIDE 0.9 % IV SOLN
INTRAVENOUS | Status: DC
  Administered 2014-08-06: 50 mL via INTRAVENOUS

## 2014-08-06 MED ORDER — ALBUTEROL SULFATE (2.5 MG/3ML) 0.083% IN NEBU: 2.5000 mg | INHALATION_SOLUTION | RESPIRATORY_TRACT | Status: DC | PRN

## 2014-08-06 MED ORDER — PANTOPRAZOLE SODIUM 40 MG IV SOLR
40.0000 mg | INTRAVENOUS | Status: DC
  Administered 2014-08-06: 40 mg via INTRAVENOUS
  Filled 2014-08-06 (×2): qty 40
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
## 2014-08-06 NOTE — Op Note (Signed)
NAMECARMERON, HEADY NO.:  [id number]  MEDICAL RECORD NO.:  [id number]  LOCATION:  2S09C                        FACILITY:  [facility]  PHYSICIAN:  Revonda Standard. Roxan Hockey, M.D.DATE OF BIRTH:  Sep 10, 1960  DATE OF PROCEDURE:  08/05/2014 DATE OF DISCHARGE:                              OPERATIVE REPORT   PREOPERATIVE DIAGNOSIS:  Stage IIA squamous cell carcinoma, left upper lobe, status post neoadjuvant chemoradiation.  POSTOPERATIVE DIAGNOSIS:  Stage IIA squamous cell carcinoma, left upper lobe, status post neoadjuvant chemoradiation.  PROCEDURES:  Left video-assisted thoracoscopy, lysis of adhesions, thoracoscopic left upper lobectomy, mediastinal lymph node sampling, On- Q local anesthetic catheter placement.  SURGEON:  Revonda Standard. Roxan Hockey, M.D.  ASSISTANT:  Suzzanne Cloud, P.A.  ANESTHESIA:  General.  FINDINGS:  Adhesions to mediastinum anteriorly and chest wall laterally. Fissure dissection difficult due to radiation changes. Bronchial margin negative for tumor.  CLINICAL NOTE:  Mr. Dutko is a 54 year old man with a history of tobacco abuse and COPD.  Earlier this year, he was diagnosed with squamous cell carcinoma of the left upper lobe.  This was a central mass abutting the pulmonary artery.  Biopsies revealed squamous cell carcinoma.  He was treated with preoperative neoadjuvant chemotherapy and radiation to 45 Gy.  Repeat scanning showed a good response and the patient was offered surgical resection.  The indications, risks, benefits, and alternatives were discussed in detail with the patient. He understood that we would initiate the procedure with a thoracoscopic minimally-invasive approach, but there was a high probability of needing to convert to a thoracotomy.  He understood and accepted the risks of surgery and agreed to proceed.  OPERATIVE NOTE:  Mr. Sage was brought to the preoperative holding area on August 05, 2014. Anesthesia placed a  central line and an arterial blood pressure monitoring line.  He was taken to the operating room, anesthetized, and intubated with a double-lumen endotracheal tube. A Foley catheter was placed.  Intravenous antibiotics were administered. Sequential compressive devices were placed on the calves for DVT prophylaxis.  He was placed in a right lateral decubitus position and the left chest was prepped and draped in the usual sterile fashion. Single lung ventilation of the right lung was initiated and was tolerated well throughout the procedure.  An incision was made in the seventh intercostal space in the midaxillary line.  A 5-mm port was used to enter the chest.  A thoracoscope was advanced in the chest.  There was good isolation of the left lung. There were adhesions of the left lung to the chest wall, primarily affecting the upper lobe.  There were also significant adhesions of the lower lobe to the diaphragm and there were severe adhesions of the upper lobe to the anterior mediastinum.  The adhesions were taken down with a combination of sharp dissection and electrocautery.  The adhesions to the diaphragm were only partially taken down, but the adhesions to the lateral chest wall and the mediastinum were completely taken down.  Care was taken to preserve the phrenic nerve during the takedown of adhesions anteriorly.  After the adhesions were taken down, the major fissure was inspected. There was edema and some scar tissue present, this  was taken down with cautery initially, but the fissure was incomplete.  The pleural reflection was divided at the hilum anteriorly and superiorly.  The pulmonary veins were dissected out, but not divided at this point.  Sequential firings of a 45-mm endoscopic stapler were used to divide the fissure between the lingula and the lower lobe.  Attempts to dissect down to the pulmonary artery in the fissure were difficult due to the fissure being very obscured  in that area.  The dissection was taken back to the hilum. A level 11 node was removed, this node was adhesed to the surrounding tissues and bled easily.  After removing this node, the most anterior basilar segmental branch of the lower lobe artery was identified, this allowed more of the fissure to be stapled.  Additional dissection exposed the lingular arterial branches as well as the remaining basilar segmental branches. At this point, the decision was made that a lobectomy was feasible.  The lingular arterial branches were divided with an endoscopic vascular stapler.  The lingular vein branches were dissected out, encircled and divided with endoscopic vascular stapler as well.  The remaining upper lobe vein branches were encircled and stapled also.  The dissection then was carried back into the fissure and the dissection was carried along the pulmonary artery completing division of the fissure with the endo GIA type stapler.  The posterior pulmonary arterial branch was identified, dissected out, encircled and divided.  The lymph node around these vessels were taken with the upper lobe, not as separate specimens due to the severe inflammatory response around them.  The dissection was carried back anteriorly, a smaller upper lobe arterial branch was identified, dissected out and divided with the endoscopic vascular stapler.  The final remaining upper lobe branch was not visible at this point.  Dissection was carried out around the bronchus, there were nodes around the bronchus that bled easily. Ultimately, it was decided to go ahead and divide the left upper lobe bronchus before dividing the final PA branch.  After the bronchus was encircled, the endoscopic stapler with a green cartridge was placed across the left upper lobe bronchus, flush with its origin and closed.  A test inflation showed good aeration of the lower lobe.  The stapler was fired transecting the bronchus.  At this point,  only very gentle retraction was made on the left upper lobe.  The final pulmonary artery branch, which was in the vicinity of the residual tumor mass on CT scan was carefully exposed.  The endoscopic vascular staple was placed across the branch and fired.  The left upper lobe was placed into an endoscopic retrieval bag.  They was some parenchymal tearing of the upper lobe as it was placed into the bag, but this was not at the site of the tumor.  The specimen was removed and sent for frozen section of the bronchial margin, which subsequently returned with no tumor seen.  The chest was copiously irrigated with saline.  A test inflation to 30 cm of water revealed no air leakage from the bronchial stump.  There was some minimal parenchymal leakage along the fissure.  ProGEL was applied to this area.  An On-Q local anesthetic catheter was placed through a separate stab incision and tunneled into a subpleural location.  It was primed with 5 mL of 0.5% Marcaine.  The aortopulmonary window was opened and tissue from this area with a possible lymph node was removed.  No attempt was made to dissect out the  subcarinal nodes due to the inflammatory reaction in that area.  A 28-French Blake drain was placed through the original port incision.  The second port incision, which had been used for instrumentation was used now to place a 28-French chest tube.  These were both secured with #1 silk sutures. The left lower lobe was reinflated.  The incision was closed in three layers with #1 Vicryl fascial suture followed by 2-0 Vicryl subcutaneous suture and a 3-0 Vicryl subcuticular suture.  All sponge, needle, and instrument counts were correct at the end of the procedure.  The patient was taken from the operating room to the postanesthetic care unit.  He was extubated, although it did take him relatively long time to be extubated.  He was taken to the postanesthetic care unit on BiPAP.     Revonda Standard  Roxan Hockey, M.D.     SCH/MEDQ  D:  08/05/2014  T:  08/06/2014  Job:  [id number]

## 2014-08-06 NOTE — Progress Notes (Signed)
1 Day Post-Op Procedure(s) (LRB): VIDEO ASSISTED THORACOSCOPY (VATS) (Left) LEFT UPPER LOBECTOMY (Left) Subjective: Some incisional pain  Objective: Vital signs in last 24 hours: Temp:  [97.2 F (36.2 C)-98.4 F (36.9 C)] 97.8 F (36.6 C) (06/16 0346) Pulse Rate:  [71-119] 101 (06/16 0715) Cardiac Rhythm:  [-] Sinus tachycardia (06/16 0700) Resp:  [6-35] 22 (06/16 0756) BP: (118-172)/(67-105) 136/86 mmHg (06/16 0700) SpO2:  [93 %-99 %] 99 % (06/16 0756) Arterial Line BP: (131-189)/(55-100) 154/75 mmHg (06/16 0715) FiO2 (%):  [40 %-50 %] 40 % (06/16 0700) Weight:  [235 lb 8 oz (106.822 kg)-237 lb (107.502 kg)] 235 lb 8 oz (106.822 kg) (06/16 0530)  Hemodynamic parameters for last 24 hours:    Intake/Output from previous day: 06/15 0701 - 06/16 0700 In: 2950 [I.V.:2900; IV Piggyback:50] Out: 2145 [Urine:970; Blood:800; Chest Tube:375] Intake/Output this shift: Total I/O In: -  Out: 105 [Urine:75; Chest Tube:30]  General appearance: alert, no distress and increased WOB Neurologic: intact Heart: regular rate and rhythm Lungs: diminished breath sounds left base and wheezes bilaterally Abdomen: normal findings: mildly distended, nontender small air leak  Lab Results:  Recent Labs  08/03/14 1637 08/05/14 1534 08/06/14 0350  WBC 5.7  --  19.6*  HGB 13.2 11.9* 13.5  HCT 39.8 35.0* 40.5  PLT 256  --  306   BMET:  Recent Labs  08/03/14 1637 08/05/14 1534 08/06/14 0350  NA 138 140 137  K 3.6 4.2 5.0  CL 103  --  101  CO2 29  --  27  GLUCOSE 159*  --  184*  BUN 12  --  19  CREATININE 0.85  --  1.08  CALCIUM 9.3  --  8.7*    PT/INR:  Recent Labs  08/03/14 1637  LABPROT 13.6  INR 1.02   ABG    Component Value Date/Time   PHART 7.208* 08/06/2014 0453   HCO3 26.8* 08/06/2014 0453   TCO2 29 08/06/2014 0453   ACIDBASEDEF 3.0* 08/06/2014 0453   O2SAT 95.0 08/06/2014 0453   CBG (last 3)  No results for input(s): GLUCAP in the last 72  hours.  Assessment/Plan: S/P Procedure(s) (LRB): VIDEO ASSISTED THORACOSCOPY (VATS) (Left) LEFT UPPER LOBECTOMY (Left) POD # 1 Left upper lobectomy  Hypercarbic acute on chronic respiratory failure- change albuterol to Q4  IS  Continue spiriva  Will consult pulmonary/ CCM  Pain control- PCA, add toradol x 24 hours  DVT prophylaxis- SCD,  Add enoxaparin   LOS: 1 day    [name] 08/06/2014

## 2014-08-06 NOTE — Progress Notes (Signed)
Titrating BiPAP RR per PaC02 values from ABG. Pt presents with inspiratory/expiratory wheezes bilaterally. Initiated scheduled bronchodilator Tx. Will assess and monitor with RN as necessary.

## 2014-08-06 NOTE — Progress Notes (Signed)
RT readjusted Pts mask to improve patient comfort. Leak is 15.

## 2014-08-06 NOTE — Progress Notes (Signed)
      [address]       [place] [address]             [phone]      More alert this PM  Minimal pain, no dyspnea  BP 124/73 mmHg  Pulse 103  Temp(Src) 97.6 F (36.4 C) (Oral)  Resp 11  Ht '5\' 8"'$  (1.727 m)  Wt 235 lb 8 oz (106.822 kg)  BMI 35.82 kg/m2  SpO2 96%   Intake/Output Summary (Last 24 hours) at 08/06/14 1932 Last data filed at 08/06/14 1800  Gross per 24 hour  Intake   1815 ml  Output   1555 ml  Net    260 ml    ABG    Component Value Date/Time   PHART 7.266* 08/06/2014 1337   PCO2ART 63.3* 08/06/2014 1337   PO2ART 100.0 08/06/2014 1337   HCO3 29.0* 08/06/2014 1337   TCO2 31 08/06/2014 1337   ACIDBASEDEF 3.0* 08/06/2014 0453   O2SAT 97.0 08/06/2014 1337   Still with hypercarbic acute on chronic respiratory failure, pH improving  He looks better this PM  Appreciate Pulmonary/ CCM input  Prudie Guthridge C. Roxan Hockey, MD [facility] [phone]

## 2014-08-07 ENCOUNTER — Inpatient Hospital Stay (HOSPITAL_COMMUNITY): Payer: No Typology Code available for payment source

## 2014-08-07 ENCOUNTER — Encounter (HOSPITAL_COMMUNITY): Payer: Self-pay | Admitting: Thoracic Surgery (Cardiothoracic Vascular Surgery)

## 2014-08-07 DIAGNOSIS — J9622 Acute and chronic respiratory failure with hypercapnia: Secondary | ICD-10-CM

## 2014-08-07 DIAGNOSIS — J432 Centrilobular emphysema: Secondary | ICD-10-CM

## 2014-08-07 DIAGNOSIS — R739 Hyperglycemia, unspecified: Secondary | ICD-10-CM

## 2014-08-07 DIAGNOSIS — J9811 Atelectasis: Secondary | ICD-10-CM

## 2014-08-07 LAB — COMPREHENSIVE METABOLIC PANEL
ALBUMIN: 3.4 g/dL — AB (ref 3.5–5.0)
ALK PHOS: 54 U/L (ref 38–126)
ALT: 27 U/L (ref 17–63)
AST: 24 U/L (ref 15–41)
Anion gap: 7 (ref 5–15)
BILIRUBIN TOTAL: 0.9 mg/dL (ref 0.3–1.2)
BUN: 28 mg/dL — ABNORMAL HIGH (ref 6–20)
CHLORIDE: 98 mmol/L — AB (ref 101–111)
CO2: 31 mmol/L (ref 22–32)
Calcium: 8.4 mg/dL — ABNORMAL LOW (ref 8.9–10.3)
Creatinine, Ser: 1.09 mg/dL (ref 0.61–1.24)
GFR calc non Af Amer: >60 mL/min (ref >60)
GLUCOSE: 146 mg/dL — AB (ref 65–99)
POTASSIUM: 4.9 mmol/L (ref 3.5–5.1)
Sodium: 136 mmol/L (ref 135–145)
Total Protein: 6.7 g/dL (ref 6.5–8.1)

## 2014-08-07 LAB — CBC
HCT: 35.3 % — ABNORMAL LOW (ref 39.0–52.0)
HEMOGLOBIN: 11.2 g/dL — AB (ref 13.0–17.0)
MCH: 32 pg (ref 26.0–34.0)
MCHC: 31.7 g/dL (ref 30.0–36.0)
MCV: 100.9 fL — ABNORMAL HIGH (ref 78.0–100.0)
PLATELETS: 245 10*3/uL (ref 150–400)
RBC: 3.5 MIL/uL — ABNORMAL LOW (ref 4.22–5.81)
RDW: 17.5 % — ABNORMAL HIGH (ref 11.5–15.5)
WBC: 15.7 10*3/uL — AB (ref 4.0–10.5)

## 2014-08-07 LAB — POCT I-STAT 3, ART BLOOD GAS (G3+)
ACID-BASE EXCESS: 3 mmol/L — AB (ref 0.0–2.0)
Bicarbonate: 31.4 mEq/L — ABNORMAL HIGH (ref 20.0–24.0)
O2 SAT: 85 %
Patient temperature: 97.6
TCO2: 33 mmol/L (ref 0–100)
pCO2 arterial: 66 mmHg (ref 35.0–45.0)
pH, Arterial: 7.283 — ABNORMAL LOW (ref 7.350–7.450)
pO2, Arterial: 56 mmHg — ABNORMAL LOW (ref 80.0–100.0)

## 2014-08-07 MED ORDER — BUDESONIDE 0.5 MG/2ML IN SUSP
0.5000 mg | Freq: Two times a day (BID) | RESPIRATORY_TRACT | Status: DC
  Administered 2014-08-07 – 2014-08-12 (×10): 0.5 mg via RESPIRATORY_TRACT
  Filled 2014-08-07 (×15): qty 2

## 2014-08-07 MED ORDER — PANTOPRAZOLE SODIUM 40 MG PO TBEC
40.0000 mg | DELAYED_RELEASE_TABLET | Freq: Every day | ORAL | Status: DC
  Administered 2014-08-07 – 2014-08-12 (×6): 40 mg via ORAL
  Filled 2014-08-07 (×6): qty 1

## 2014-08-07 MED ORDER — GUAIFENESIN ER 600 MG PO TB12
1200.0000 mg | ORAL_TABLET | Freq: Two times a day (BID) | ORAL | Status: DC
  Administered 2014-08-07 – 2014-08-12 (×11): 1200 mg via ORAL
  Filled 2014-08-07 (×13): qty 2

## 2014-08-07 MED ORDER — ARFORMOTEROL TARTRATE 15 MCG/2ML IN NEBU
15.0000 ug | INHALATION_SOLUTION | Freq: Two times a day (BID) | RESPIRATORY_TRACT | Status: DC
  Administered 2014-08-07 – 2014-08-12 (×10): 15 ug via RESPIRATORY_TRACT
  Filled 2014-08-07 (×16): qty 2

## 2014-08-07 MED ORDER — KETOROLAC TROMETHAMINE 30 MG/ML IJ SOLN
30.0000 mg | Freq: Four times a day (QID) | INTRAMUSCULAR | Status: AC
  Administered 2014-08-07 – 2014-08-08 (×4): 30 mg via INTRAVENOUS
  Filled 2014-08-07 (×4): qty 1

## 2014-08-07 MED ORDER — METOCLOPRAMIDE HCL 5 MG/ML IJ SOLN
10.0000 mg | Freq: Four times a day (QID) | INTRAMUSCULAR | Status: AC
  Administered 2014-08-07 (×4): 10 mg via INTRAVENOUS
  Filled 2014-08-07 (×4): qty 2

## 2014-08-07 NOTE — Progress Notes (Signed)
Utilization review completed.  

## 2014-08-07 NOTE — Progress Notes (Signed)
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  He has been followed by CVTS r/t residual LUL mass and presented 6/15 for elective VATS with LULobectomy.  Extubated post op but on 6/16 developed worsening hypercarbia/ dyspnea requiring bipap and PCCM consulted.   STUDIES:   SIGNIFICANT EVENTS: 6/15 OR>>> L VATS, LULobectomy   SUBJECTIVE: Feels weak, up in chair, didn't walk far this moring  VITAL SIGNS: Temp:  [97.3 F (36.3 C)-97.9 F (36.6 C)] 97.3 F (36.3 C) (06/17 0804) Pulse Rate:  [93-110] 103 (06/17 0900) Resp:  [9-25] 12 (06/17 0900) BP: (108-174)/(62-94) 156/81 mmHg (06/17 0900) SpO2:  [88 %-100 %] 88 % (06/17 0900) Arterial Line BP: (96-181)/(59-86) 168/62 mmHg (06/17 0900) FiO2 (%):  [40 %] 40 % (06/17 0400) Weight:  [106 kg (233 lb 11 oz)] 106 kg (233 lb 11 oz) (06/17 0600) HEMODYNAMICS:   VENTILATOR SETTINGS: Vent Mode:  [-]  FiO2 (%):  [40 %] 40 % INTAKE / OUTPUT:  Intake/Output Summary (Last 24 hours) at 08/07/14 0954 Last data filed at 08/07/14 0800  Gross per 24 hour  Intake   1135 ml  Output   1945 ml  Net   -810 ml    PHYSICAL EXAMINATION: General:  In chair, breathing comfortably HENT: NCAT EOMi PULM: diminished left, CTA R, no wheezing CV: Tachy, regular, no mgr GI: BS+, soft, nontender Derm: no rash, ext warm no edema Neuro: A&Ox4, maew  LABS:  CBC  Recent Labs Lab 08/03/14 1637 08/05/14 1534 08/06/14 0350 08/07/14 0425  WBC 5.7  --  19.6* 15.7*  HGB 13.2 11.9* 13.5 11.2*  HCT 39.8 35.0* 40.5 35.3*  PLT 256  --  306 245   Coag's  Recent Labs Lab 08/03/14 1637  APTT 28  INR 1.02    BMET  Recent Labs Lab 08/03/14 1637 08/05/14 1534 08/06/14 0350 08/07/14 0425  NA 138 140 137 136  K 3.6 4.2 5.0 4.9  CL 103  --  101 98*  CO2 29  --  27 31  BUN 12  --  19 28*  CREATININE 0.85  --  1.08 1.09  GLUCOSE 159*  --  184* 146*   Electrolytes  Recent Labs Lab 08/03/14 1637 08/06/14 0350 08/07/14 0425  CALCIUM 9.3 8.7* 8.4*   Sepsis Markers No results for input(s): LATICACIDVEN, PROCALCITON, O2SATVEN in the last 168 hours. ABG  Recent Labs Lab 08/06/14 0453 08/06/14 1337 08/07/14 0751  PHART 7.208* 7.266* 7.283*  PCO2ART 66.9* 63.3* 66.0*  PO2ART 92.0 100.0 56.0*   Liver Enzymes  Recent Labs Lab 08/03/14 1637 08/07/14 0425  AST 24 24  ALT 30 27  ALKPHOS 66 54  BILITOT 0.4 0.9  ALBUMIN 4.0 3.4*   Cardiac Enzymes No results for input(s): TROPONINI, PROBNP in the last 168 hours. Glucose No results for input(s): GLUCAP in the last 168 hours.  Imaging CXR 6/17 atelectasis left lung, no clear effusion   ASSESSMENT / PLAN:  PULMONARY Acute on chronic hypercarbic respiratory failure > stable, not unexpected with this degree of COPD OSA> On CPAP at home (non-compliant)  Squamous cell lung ca - stage IIa -  s/p chemo and XRT  L VATS/ LULobectomy 6/15 Atelectasis L lung> likely some degree of mucus plugging P:   CPAP whenever he sleeps Goal O2 saturation 88-92%% Continue duoneb scheduled, hold tiotropium Start Pulmicort and Brovana Out of bed as much as possible I encouraged him to cough frequently, splint with pillow Daily CXR  Chest tubes per CVTS  Hold off on systemic steroids for now Minimize sedation  CARDIOVASCULAR CVL R IJ 6/15>>>  Tachycardia - mild  P:  Monitor   RENAL Hyperkalemia - resolved P:   Monitor BMET and UOP Replace electrolytes as needed   GASTROINTESTINAL No active issue  P:   Advance diet PPI   HEMATOLOGIC Leukocytosis - post op reaction, improving P:  Monitor for bleeding F/u cbc  lovenox  for DVT prophylaxis  INFECTIOUS No evidence active infection  P:   Monitor wbc, fever curve off abx   ENDOCRINE Hyperglycemia - no known hx DM   P:   Monitor glucose SSI if  > 180   NEUROLOGIC Post op pain  Sonorous from narcotics, mildly hypercarbic P:   PCA per CVTS > would work to get it off in next 24 hours Use non-narcotic meds as able Avoid oversedation    FAMILY  - Updates:  Wife updated bedside 6/17  - Inter-disciplinary family meet or Palliative Care meeting due by:  6/23  Roselie Awkward, MD [facility] Pager: [phone] Cell: [phone] After 3pm or if no response, call [phone]

## 2014-08-07 NOTE — Progress Notes (Signed)
2 Days Post-Op Procedure(s) (LRB): VIDEO ASSISTED THORACOSCOPY (VATS) (Left) LEFT UPPER LOBECTOMY (Left) Subjective: C/o incisional pain Nausea earlier Has a weak cough effort  Objective: Vital signs in last 24 hours: Temp:  [97.4 F (36.3 C)-98.6 F (37 C)] 97.7 F (36.5 C) (06/17 0400) Pulse Rate:  [93-110] 104 (06/17 0700) Cardiac Rhythm:  [-] Sinus tachycardia (06/17 0400) Resp:  [9-25] 11 (06/17 0700) BP: (108-174)/(62-94) 126/69 mmHg (06/17 0600) SpO2:  [89 %-100 %] 89 % (06/17 0700) Arterial Line BP: (91-181)/(59-86) 96/70 mmHg (06/17 0700) FiO2 (%):  [40 %] 40 % (06/17 0400) Weight:  [233 lb 11 oz (106 kg)] 233 lb 11 oz (106 kg) (06/17 0600)  Hemodynamic parameters for last 24 hours:    Intake/Output from previous day: 06/16 0701 - 06/17 0700 In: 1265 [I.V.:1265] Out: 1925 [Urine:1515; Chest Tube:410] Intake/Output this shift:    General appearance: alert and no distress Neurologic: intact Heart: regular rate and rhythm Lungs: diminished breath sounds left side Abdomen: distended, nontender, hypoactive BS serosaguinous output from CT, no air leak  Lab Results:  Recent Labs  08/06/14 0350 08/07/14 0425  WBC 19.6* 15.7*  HGB 13.5 11.2*  HCT 40.5 35.3*  PLT 306 245   BMET:  Recent Labs  08/06/14 0350 08/07/14 0425  NA 137 136  K 5.0 4.9  CL 101 98*  CO2 27 31  GLUCOSE 184* 146*  BUN 19 28*  CREATININE 1.08 1.09  CALCIUM 8.7* 8.4*    PT/INR: No results for input(s): LABPROT, INR in the last 72 hours. ABG    Component Value Date/Time   PHART 7.266* 08/06/2014 1337   HCO3 29.0* 08/06/2014 1337   TCO2 31 08/06/2014 1337   ACIDBASEDEF 3.0* 08/06/2014 0453   O2SAT 97.0 08/06/2014 1337   CBG (last 3)  No results for input(s): GLUCAP in the last 72 hours.  Assessment/Plan: S/P Procedure(s) (LRB): VIDEO ASSISTED THORACOSCOPY (VATS) (Left) LEFT UPPER LOBECTOMY (Left) POD # 2  CV- stable  RESP- acute on chronic respiratory failure-  recheck ABG  Chest xray shows worsening atelectasis on left- add mucinex, flutter valve  Continue nebs  No air leak - CT to water seal  RENAL_ creatinine and lytes oK  PAIN CONTROL- on PCA, continue toradol another 24 hours  DVT prophylaxis- SCD + enoxaparin  Mobilize as tolerated   LOS: 2 days    [name] 08/07/2014

## 2014-08-07 NOTE — Discharge Summary (Signed)
Physician Discharge Summary       [name]       [place] [address]             [phone]    Patient ID: Vincent Sutton MRN: [id number] DOB/AGE: November 20, 1960 54 y.o.  Admit date: 08/05/2014 Discharge date: 08/07/2014  Admission Diagnoses: 1. Squamous cell carcinoma (stage IIA) 2. S/p chemo radiation 3. History of hypercholesterolemia 4. History of fatty liver  5. History of COPD  Discharge Diagnoses:  1. Squamous cell carcinoma (stage IIA)/ ypT1bN0 post treatment 2. S/p chemo radiation 3. History of hypercholesterolemia 4. History of fatty liver  5. History of COPD   Procedure (s):  Left video-assisted thoracoscopy, lysis of adhesions, thoracoscopic left upper lobectomy, mediastinal lymph node sampling, On-Q local anesthetic catheter placement by Dr. Servando Snare on 08/05/2014.  Pathology: Diagnosis 1. Lung, resection (segmental or lobe), Left upper lobe - INVASIVE SQUAMOUS CELL CARCINOMA, MODERATELY DIFFERENTIATED, SPANNING 2.5 CM. - THE SURGICAL RESECTION MARGINS ARE NEGATIVE FOR CARCINOMA. - THERE IS NO EVIDENCE OF CARCINOMA IN 3 OF 3 LYMPH NODES (0/3). - SEE ONCOLOGY TABLE BELOW. 2. Lymph node, biopsy, 11 L - THERE IS NO EVIDENCE OF CARCINOMA IN 1 OF 1 LYMPH NODE (0/1). 3. Lymph node, biopsy, 11 #2 - THERE IS NO EVIDENCE OF CARCINOMA IN 1 OF 1 LYMPH NODE (0/1). 4. Soft tissue, biopsy, AP Window Tissue - THERE IS NO EVIDENCE OF CARCINOMA IN 1 OF 1 LYMPH NODE (0/1). Microscopic Comment 1. LUNG Specimen, including laterality: Left lung and mediastinal lymph nodes. Procedure: Lobectomy and lymph node resections. Specimen integrity (intact/disrupted): Intact. Tumor site: Left upper lobe. Tumor focality: Unifocal. Maximum tumor size (cm): 2.5 cm (gross measurement). Histologic type: Squamous cell carcinoma. Grade: Moderately differentiated. Margins: Negative for carcinoma. Distance to closest margin (cm): 0.5 cm to the vascular margin (gross  measurement). Visceral pleura invasion: Not identified. Tumor extension: Confined to lung parenchyma. Treatment effect (if treated with neoadjuvant therapy): Present in lung parenchyma and lymph nodes. Lymph -Vascular invasion: Not identified. Lymph nodes: Number examined - 6; Number N1 nodes positive 0; Number N2 nodes positive 0 TNM code: ypT1b, ypN0 Ancillary studies: N/A 1 of 3 FINAL for [place], [place] ([id number]) Microscopic Comment(continued) Non-neoplastic lung: [place] and emphysematous change. Comments: Additional studies can be performed upon clinician request. (JBK:ecj 08/07/2014) Enid Cutter MD Pathologist, Electronic Signature (Case signed 08/07/2014) Intraoperative Diagnosis 1. RAPID INTRAOPERATIVE DIAGNOSIS: FROZEN SECTION - LEFT UPPER LOBE BRONCHIAL MARGIN NEGATIVE FOR TUMOR. (CRR) Total: 1 Specimen Gross and Clinical Information Specimen(s) Obtained: 1. Lung, resection (segmental or lobe), Left upper lobe 2. Lymph node, biopsy, 11 L 3. Lymph node, biopsy, 11 #2 4. Soft tissue, biopsy, AP Window Tissue Specimen Clinical Information 1. LUL cancer (tl) Gross 1. Specimen: Received fresh for frozen section labeled left upper lobe. Specimen integrity (intact/incised/disrupted): Intact with stapled bronchial and vascular margins. Size, weight: 503 grams, 21.0 x 14.0 x 4.0 cm. Pleura: Tan-purple, with focal areas of adhesions and multiple blood filled bullae ranging from 1.5 to 7 cm in greatest dimension. Multiple stapled margins are identified at the hilum. Lesion: Adjacent the hilum is a 2.5 x 2.5 x 2.0 cm gray-white, stellate, firm lesion. The lesion grossly appears to involve vasculature. Margin(s): The lesion is 0.8 cm from the bronchial margin and 0.5 cm from the vascular margin. Hilar vessels: Received with stapled margins and located 0.5 cm from the lesion. Nonneoplastic parenchyma: The remaining parenchyma is red-brown and spongy with focal area  of emphysematous  changes. Lymph nodes, level 12: Three lymph nodes are identified ranging from 0.5 to 1.0 cm in greatest dimension. Block Summary: A = bronchial margin submitted for frozen section. B = vascular margins. C - F = lesion. G = lesion to normal tissue. H = bullae. I = emphysematous changes. J = three lymph nodes. K = uninvolved normal. Eleven blocks total. 2. Received in formalin labeled level 11 node is a 1.5 x 1.0 x 0.3 cm aggregate of black soft tissue fragments, which are submitted in one cassette. 3. Received in formalin labeled level 11 node number 2 is a 1.0 x 0.9 x 0.3 cm aggregate of tan-black soft tissue fragments, which are submitted in toto in one block. 2 of 3 FINAL for JASSEN, SARVER ([id number]) Gross(continued) 4. Received fresh labeled AP window tissue is a 2.0 x 2.0 x 0.6 cm aggregate of yellow lobulated adipose tissue, which is sectioned to reveal a single 1.2 x 0.7 x 0.6 cm tan-black lymph node, which is entirely submitted in one cassette. (KF:ecj 08/06/2014) Report signed out from the following location(s) Technical component and interpretation was performed at [facility] [place], [place], [place] [address]. CLIA #: [id number], 3 of  History of Presenting Illness: This is a 54 year old former smoker with significant COPD and a stage IIA squamous cell carcinoma of the left upper lobe. He has completed new adjuvant chemotherapy and radiation. He received a total of 45 gray of radiation. He has had an excellent response although there is still residual mass centrally in the left upper lobe.  Given his excellent response to treatment I think surgical resection is an option. His FEV1 on spirometry prior to treatment was 1.26. That certainly would not tolerate a pneumonectomy. He has not smoked since starting treatment. I do think we need to repeat his FEV1 but with formal pulmonary function testing including broncho-dilators and a  room air blood gas. I suspect that he will be able to tolerate a left upper lobectomy, but would be surprised if the pneumonectomy as an option.  This mass is still concerning in terms of resectability due to its proximity to the pulmonary artery. There is a real possibility that this turns out to be unresectable, but we will only know that for sure with surgical exploration.  Dr. Roxan Hockey recommended to the patient that we proceed with left VATS, probable thoracotomy, left upper lobectomy. Dr. Roxan Hockey discussed the general nature of the procedure Mr. and Mrs. Rueth. He reviewed the need for general anesthesia, the incisions to be used, intraoperative decision making, use of chest tubes postoperatively, hospital stay and overall recovery. I do think we will need to do a thoracotomy if it is in fact resectable. They do understand that chronic postthoracotomy pain is a possibility.  Dr. Roxan Hockey reviewed the indications, risks, benefits, and alternatives. They understand the risk include but are not limited to death, MI, DVTPE, stroke, bleeding, possible need for transfusion, prolonged air leak, cardiac arrhythmias, infection, bronchial stump for wound healing complications, as well as the possibility of unforeseeable complications.  He understands and accepts the risk and agrees to proceed. He was scheduled him for Wednesday, June 15.  Brief Hospital Course:  Patient has remained a febrile and hemodynamically stable.  Foley and a line were removed early in his post operative course. Chest tube output gradually decreased. There was no air leak. Chest tubes were placed to water seal. Daily chest x rays remained stable. Anterior chest tube was removed on  06/18. Remaining chest tube was removed 06/19. He is ambulating on room air. He is tolerating a diet and has had a bowel movement.   Latest Vital Signs: Blood pressure 147/78, pulse 94, temperature 97.3 F (36.3 C), temperature source Oral,  resp. rate 11, height '5\' 8"'$  (1.727 m), weight 233 lb 11 oz (106 kg), SpO2 92 %.  Physical Exam:  General appearance: alert, cooperative and no distress Heart: regular rate and rhythm Lungs: mildly dim in L base Abdomen: benign Extremities: no edema Wound: incis healing well  Discharge Condition:Stable and discharged to home.  Recent laboratory studies:  Lab Results  Component Value Date   WBC 15.7* 08/07/2014   HGB 11.2* 08/07/2014   HCT 35.3* 08/07/2014   MCV 100.9* 08/07/2014   PLT 245 08/07/2014   Lab Results  Component Value Date   NA 136 08/07/2014   K 4.9 08/07/2014   CL 98* 08/07/2014   CO2 31 08/07/2014   CREATININE 1.09 08/07/2014   GLUCOSE 146* 08/07/2014      Diagnostic Studies: Ct Chest W Contrast  07/13/2014   CLINICAL DATA:  Followup squamous cell carcinoma of left lung. Evaluate after chemotherapy and radiation therapy, completed 07/08/2014. Ex-smoker. COPD. Left upper lobe primary.  EXAM: CT CHEST WITH CONTRAST  TECHNIQUE: Multidetector CT imaging of the chest was performed during intravenous contrast administration.  CONTRAST:  159m OMNIPAQUE IOHEXOL 300 MG/ML  SOLN  COMPARISON:  04/23/2014 PET.  04/07/2014 CT.  FINDINGS: Mediastinum/Nodes: Aortic and branch vessel atherosclerosis. Heart size upper normal, without pericardial effusion. No central pulmonary embolism, on this non-dedicated study. LAD coronary artery atherosclerosis on image 30.  Stable small middle mediastinal nodes. None are pathologic by size criteria. Mild left gynecomastia.  Lungs/Pleura: No pleural fluid. Advanced centrilobular emphysema. Central left upper lobe lung mass with direct extension to the left hilum again identified. Significantly decreased in size. 1.8 x 3.9 cm on image 26 of series 2 versus 4.8 x 3.7 cm on 04/07/2014.  Mild motion degradation in the mid chest. The left upper lobe bronchus is better aerated, with persistent mass effect and probable obstruction within the anterior  segmental bronchus.  Improved left upper lobe postobstructive pneumonitis with mild peripheral interstitial thickening remaining.  Subpleural 2 mm left lower lobe pulmonary nodules including on images 42 and 41 are similar.  A left lower lobe 5 mm nodule on image 46 is similar to on image 49 of the prior diagnostic CT.  Upper abdomen: mild hepatic steatosis. Normal imaged portions of the spleen, stomach, pancreas, adrenal glands, kidneys.  Musculoskeletal: Remote left rib trauma.  IMPRESSION: 1. Response to therapy of central left upper lobe lung mass with direct extension to the left hilum. Decreased mass effect upon left upper lobe bronchial tree with marked improvement in left upper lobe postobstructive pneumonitis. 2. No areas of new or progressive disease. 3. Advanced centrilobular emphysema 4. Age advanced coronary artery atherosclerosis. Recommend assessment of coronary risk factors and consideration of medical therapy. 5. Mild hepatic steatosis. 6. Nonspecific small pulmonary nodules are not significantly changed. 7. Left gynecomastia.   Electronically Signed   By: KAbigail MiyamotoM.D.   On: 07/13/2014 16:25   Dg Chest Port 1 View  08/07/2014   CLINICAL DATA:  Left upper lobectomy.  EXAM: PORTABLE CHEST - 1 VIEW  COMPARISON:  08/06/2014, 08/05/2014, 08/03/2014.  CT 07/13/2014  FINDINGS: Left chest tubes and right IJ line in stable position. Stable cardiomegaly. Left upper lobectomy. Persistent bibasilar atelectasis. Pleural parenchymal scarring. COPD. No  pneumothorax.  IMPRESSION: 1. Left chest tubes and right IJ line in stable position. 2. Left upper lobectomy. 3. Persistent bibasilar atelectasis. Pleural parenchymal thickening consistent with scarring particularly prominent in the right mid lung again noted. COPD. 4. Stable cardiomegaly.  No pulmonary venous congestion .   Electronically Signed   By: Marcello Moores  Register   On: 08/07/2014 07:54   Discharge Medications:   Medication List    TAKE these  medications        albuterol 108 (90 BASE) MCG/ACT inhaler  Commonly known as:  PROVENTIL HFA;VENTOLIN HFA  Inhale 2 puffs into the lungs every 6 (six) hours as needed for wheezing or shortness of breath.     ALPRAZolam 0.25 MG tablet  Commonly known as:  XANAX  TAKE 1 TABLET AT BEDTIME AS NEEDED FOR ANXIETY     oxyCODONE 5 MG immediate release tablet  Commonly known as:  Oxy IR/ROXICODONE  Take 1-2 tablets (5-10 mg total) by mouth every 4 (four) hours as needed for severe pain.     tamsulosin 0.4 MG Caps capsule  Commonly known as:  FLOMAX  Take 1 capsule (0.4 mg total) by mouth daily.     tiotropium 18 MCG inhalation capsule  Commonly known as:  SPIRIVA HANDIHALER  PLACE 1 CAPSULE (18 MCG TOTAL) INTO INHALER AND INHALE DAILY.        Follow Up Appointments: Follow-up Information    Follow up with [name], MD On 08/25/2014.   Specialty:  Cardiothoracic Surgery   Why:  PA/LAT CXR to be taken (at [facility] which is in the same building as Dr. Leonarda Salon office) on 08/25/2014 at 9:00 am;Appointment time is at 9:45 am   Contact information:   [place] [address] [phone]       Signed: Lars Pinks MPA-C 08/07/2014, 12:25 PM

## 2014-08-07 NOTE — Progress Notes (Signed)
24m of Fentanyl PC wasted in sink.  Witnessed by BEllard ArtisRN.

## 2014-08-07 NOTE — Progress Notes (Signed)
CT surgery p.m. Rounds  Status post lobectomy for cancer History COPD with elevated postop PCO2 Patient now starting to ambulate and will use CPAP this evening. Continue current per pulmonary medicine.

## 2014-08-07 NOTE — Discharge Instructions (Signed)
ACTIVITY:  1.Increase activity slowly. 2.Walk daily and increase frequency and duration as tolerates. 3.May walk up steps. 4.No lifting more than ten pounds for two weeks. 5.No driving for two weeks. 6.Avoid straining. 7.STOP any activity that causes chest pain, shortness of breath, dizziness,sweating,     or excessive weakness. 8.Continue with breathing exercises daily.  DIET:  Regular diet   WOUND:  1.May shower. 2.Clean wounds with mild soap and water.  Call the office at [phone] if any problems arise.  Video-Assisted Thoracic Surgery Care After Refer to this sheet in the next few weeks. These instructions provide you with information on caring for yourself after your procedure. Your caregiver may also give you more specific instructions. Your procedure has been planned according to current medical practices, but problems sometimes occur. Call your caregiver if you have any problems or questions after your procedure. HOME CARE INSTRUCTIONS   Only take over-the-counter or prescription medications as directed.  Only take pain medications (narcotics) as directed.  Do not drive until your caregiver approves. Driving while taking narcotics or soon after surgery can be dangerous, so discuss the specific timing with your caregiver.  Avoid activities that use your chest muscles, such as lifting heavy objects, for at least 3-4 weeks.   Take deep breaths to expand the lungs and to protect against pneumonia.  Do breathing exercises as directed by your caregiver. If you were given an incentive spirometer to help with breathing, use it as directed.  You may resume a normal diet and activities when you feel you are able to or as directed.  Do not take a bath until your caregiver says it is OK. Use the shower instead.   Keep the bandage (dressing) covering the area where the chest tube was inserted (incision site) dry for 48 hours. After 48 hours, remove the dressing unless there  is new drainage.  Remove dressings as directed by your caregiver.  Change dressings if necessary or as directed.  Keep all follow-up appointments. It is important for you to see your caregiver after surgery to discuss appropriate follow-up care and surveillance, if it is necessary. SEEK MEDICAL CARE:  You feel excessive or increasing pain at an incision site.  You notice bleeding, skin irritation, drainage, swelling, or redness at an incision site.  There is a bad smell coming from an incision or dressing.  It feels like your heart is fluttering or beating rapidly.  Your pain medication does not relieve your pain. SEEK IMMEDIATE MEDICAL CARE IF:   You have a fever.   You have chest pain.  You have a rash.  You have shortness of breath.  You have trouble breathing.   You feel weak, lightheaded, dizzy, or faint.  MAKE SURE YOU:   Understand these instructions.   Will watch your condition.   Will get help right away if you are not doing well or get worse. Document Released: 06/03/2012 Document Reviewed: 06/03/2012 [facility] Patient Information 2015 [place]. This information is not intended to replace advice given to you by your health care provider. Make sure you discuss any questions you have with your health care provider.

## 2014-08-08 ENCOUNTER — Inpatient Hospital Stay (HOSPITAL_COMMUNITY): Payer: No Typology Code available for payment source

## 2014-08-08 LAB — BASIC METABOLIC PANEL
Anion gap: 4 — ABNORMAL LOW (ref 5–15)
BUN: 16 mg/dL (ref 6–20)
CALCIUM: 8.2 mg/dL — AB (ref 8.9–10.3)
CO2: 31 mmol/L (ref 22–32)
CREATININE: 0.78 mg/dL (ref 0.61–1.24)
Chloride: 100 mmol/L — ABNORMAL LOW (ref 101–111)
Glucose, Bld: 108 mg/dL — ABNORMAL HIGH (ref 65–99)
POTASSIUM: 4.5 mmol/L (ref 3.5–5.1)
Sodium: 135 mmol/L (ref 135–145)

## 2014-08-08 LAB — POCT I-STAT 3, ART BLOOD GAS (G3+)
ACID-BASE EXCESS: 5 mmol/L — AB (ref 0.0–2.0)
Bicarbonate: 32.6 mEq/L — ABNORMAL HIGH (ref 20.0–24.0)
O2 SAT: 94 %
Patient temperature: 98
TCO2: 34 mmol/L (ref 0–100)
pCO2 arterial: 61 mmHg (ref 35.0–45.0)
pH, Arterial: 7.335 — ABNORMAL LOW (ref 7.350–7.450)
pO2, Arterial: 76 mmHg — ABNORMAL LOW (ref 80.0–100.0)

## 2014-08-08 LAB — CBC
HCT: 29.4 % — ABNORMAL LOW (ref 39.0–52.0)
Hemoglobin: 9.4 g/dL — ABNORMAL LOW (ref 13.0–17.0)
MCH: 31.6 pg (ref 26.0–34.0)
MCHC: 32 g/dL (ref 30.0–36.0)
MCV: 99 fL (ref 78.0–100.0)
PLATELETS: 217 10*3/uL (ref 150–400)
RBC: 2.97 MIL/uL — ABNORMAL LOW (ref 4.22–5.81)
RDW: 17.1 % — AB (ref 11.5–15.5)
WBC: 9.4 10*3/uL (ref 4.0–10.5)

## 2014-08-08 LAB — GLUCOSE, CAPILLARY: Glucose-Capillary: 88 mg/dL (ref 65–99)

## 2014-08-08 MED ORDER — LUNG SURGERY BOOK
Freq: Once | Status: AC
  Administered 2014-08-08: 22:00:00
  Filled 2014-08-08: qty 1

## 2014-08-08 MED ORDER — TAMSULOSIN HCL 0.4 MG PO CAPS
0.4000 mg | ORAL_CAPSULE | Freq: Every day | ORAL | Status: DC
  Administered 2014-08-08 – 2014-08-12 (×5): 0.4 mg via ORAL
  Filled 2014-08-08 (×5): qty 1

## 2014-08-08 MED ORDER — SORBITOL 70 % SOLN
60.0000 mL | Freq: Once | Status: DC
  Filled 2014-08-08: qty 60

## 2014-08-08 MED ORDER — FUROSEMIDE 40 MG PO TABS
40.0000 mg | ORAL_TABLET | Freq: Every day | ORAL | Status: DC
  Administered 2014-08-08 – 2014-08-12 (×5): 40 mg via ORAL
  Filled 2014-08-08 (×5): qty 1

## 2014-08-08 MED ORDER — METOCLOPRAMIDE HCL 5 MG/ML IJ SOLN
10.0000 mg | Freq: Four times a day (QID) | INTRAMUSCULAR | Status: AC
  Administered 2014-08-08 – 2014-08-10 (×8): 10 mg via INTRAVENOUS
  Filled 2014-08-08 (×8): qty 2

## 2014-08-08 NOTE — Progress Notes (Signed)
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  He has been followed by CVTS r/t residual LUL mass and presented 6/15 for elective VATS with LULobectomy.  Extubated post op but on 6/16 developed worsening hypercarbia/ dyspnea requiring bipap and PCCM consulted.   STUDIES:   SIGNIFICANT EVENTS: 6/15 OR>>> L VATS, LULobectomy   SUBJECTIVE:  Up in chair, denies any changes, breathing OK, using IS with encouragement  VITAL SIGNS: Temp:  [97.3 F (36.3 C)-98 F (36.7 C)] 98 F (36.7 C) (06/18 0400) Pulse Rate:  [68-107] 76 (06/18 0700) Resp:  [0-22] 11 (06/18 0700) BP: (104-159)/(52-90) 124/71 mmHg (06/18 0700) SpO2:  [88 %-100 %] 99 % (06/18 0700) Arterial Line BP: (101-171)/(39-65) 123/48 mmHg (06/18 0400) Weight:  [107 kg (235 lb 14.3 oz)] 107 kg (235 lb 14.3 oz) (06/18 0600) HEMODYNAMICS:   VENTILATOR SETTINGS:   INTAKE / OUTPUT:  Intake/Output Summary (Last 24 hours) at 08/08/14 0759 Last data filed at 08/08/14 0700  Gross per 24 hour  Intake   3140 ml  Output   1870 ml  Net   1270 ml    PHYSICAL EXAMINATION: General:  In chair, breathing comfortably on 3L/min HENT: NCAT EOMi PULM: diminished left, CTA R, no wheezing, small air leak CV: regular, no mgr, 70's GI: BS+, soft, nontender Derm: no rash, ext warm no edema Neuro: A&Ox4, maew  LABS:  CBC  Recent Labs Lab 08/06/14 0350 08/07/14 0425 08/08/14 0420  WBC 19.6* 15.7* 9.4  HGB 13.5 11.2* 9.4*  HCT 40.5 35.3* 29.4*  PLT 306 245 217   Coag's  Recent Labs Lab 08/03/14 1637  APTT 28  INR 1.02   BMET  Recent Labs Lab 08/06/14 0350 08/07/14 0425  08/08/14 0420  NA 137 136 135  K 5.0 4.9 4.5  CL 101 98* 100*  CO2 '27 31 31  '$ BUN 19 28* 16  CREATININE 1.08 1.09 0.78  GLUCOSE 184* 146* 108*   Electrolytes  Recent Labs Lab 08/06/14 0350 08/07/14 0425 08/08/14 0420  CALCIUM 8.7* 8.4* 8.2*   Sepsis Markers No results for input(s): LATICACIDVEN, PROCALCITON, O2SATVEN in the last 168 hours. ABG  Recent Labs Lab 08/06/14 1337 08/07/14 0751 08/08/14 0411  PHART 7.266* 7.283* 7.335*  PCO2ART 63.3* 66.0* 61.0*  PO2ART 100.0 56.0* 76.0*   Liver Enzymes  Recent Labs Lab 08/03/14 1637 08/07/14 0425  AST 24 24  ALT 30 27  ALKPHOS 66 54  BILITOT 0.4 0.9  ALBUMIN 4.0 3.4*   Cardiac Enzymes No results for input(s): TROPONINI, PROBNP in the last 168 hours. Glucose No results for input(s): GLUCAP in the last 168 hours.  Imaging CXR 6/17 atelectasis left lung, no clear effusion   ASSESSMENT / PLAN:  PULMONARY Acute on chronic hypercarbic respiratory failure > stable, not unexpected with this degree of COPD OSA> On CPAP at home (non-compliant)  Squamous cell lung ca - stage IIa - s/p chemo and XRT  L VATS/ LULobectomy 6/15 Atelectasis L lung> likely some degree of mucus plugging Small persistent L air leak P:   CPAP whenever he sleeps; consider holding if we believe this would allow air  leak to resolve more quickly Goal O2 saturation 88-92%% Continue duoneb scheduled, hold tiotropium Start Pulmicort and Brovana Out of bed as much as possible, push IS I encouraged him to cough frequently, splint with pillow Daily CXR  Chest tubes per CVTS  Hold off on systemic steroids  Minimize sedation  CARDIOVASCULAR CVL R IJ 6/15>>>  Tachycardia - mild  P:  Monitor   RENAL Hyperkalemia - resolved P:   Monitor BMET and UOP Replace electrolytes as needed Decrease IVF 6/18 since taking good PO   GASTROINTESTINAL No active issue  P:   Advance diet PPI   HEMATOLOGIC Leukocytosis - post op reaction,  improving P:  Monitor for bleeding F/u cbc  lovenox for DVT prophylaxis  INFECTIOUS No evidence active infection  P:   Monitor wbc, fever curve off abx   ENDOCRINE Hyperglycemia - no known hx DM   P:   Monitor glucose SSI if  > 180   NEUROLOGIC Post op pain  Sonorous from narcotics, mildly hypercarbic P:   Pain management per TCTS Use non-narcotic meds as able Avoid oversedation    FAMILY  - Updates:  Wife updated bedside 6/17, patient on 6/18  - Inter-disciplinary family meet or Palliative Care meeting due by:  6/23   Vincent Apo, MD, PhD 08/08/2014, 8:04 AM [facility] [phone] or if no answer [phone]

## 2014-08-08 NOTE — Progress Notes (Signed)
3 Days Post-Op Procedure(s) (LRB): VIDEO ASSISTED THORACOSCOPY (VATS) (Left) LEFT UPPER LOBECTOMY (Left) Subjective:  P O2 improving airlleak thru pleurovac  Objective: Vital signs in last 24 hours: Temp:  [97.3 F (36.3 C)-98 F (36.7 C)] 97.9 F (36.6 C) (06/18 0800) Pulse Rate:  [68-107] 82 (06/18 0800) Cardiac Rhythm:  [-] Normal sinus rhythm (06/18 0800) Resp:  [0-22] 10 (06/18 0800) BP: (104-159)/(52-90) 117/70 mmHg (06/18 0800) SpO2:  [89 %-100 %] 100 % (06/18 0928) Arterial Line BP: (101-171)/(39-65) 123/48 mmHg (06/18 0400) Weight:  [235 lb 14.3 oz (107 kg)] 235 lb 14.3 oz (107 kg) (06/18 0600)  Hemodynamic parameters for last 24 hours:  stable  Intake/Output from previous day: 06/17 0701 - 06/18 0700 In: 3140 [P.O.:1940; I.V.:1200] Out: 3545 [Urine:1500; Chest Tube:370] Intake/Output this shift: Total I/O In: 10 [I.V.:10] Out: -     Lab Results:  Recent Labs  08/07/14 0425 08/08/14 0420  WBC 15.7* 9.4  HGB 11.2* 9.4*  HCT 35.3* 29.4*  PLT 245 217   BMET:  Recent Labs  08/07/14 0425 08/08/14 0420  NA 136 135  K 4.9 4.5  CL 98* 100*  CO2 31 31  GLUCOSE 146* 108*  BUN 28* 16  CREATININE 1.09 0.78  CALCIUM 8.4* 8.2*    PT/INR: No results for input(s): LABPROT, INR in the last 72 hours. ABG    Component Value Date/Time   PHART 7.335* 08/08/2014 0411   HCO3 32.6* 08/08/2014 0411   TCO2 34 08/08/2014 0411   ACIDBASEDEF 3.0* 08/06/2014 0453   O2SAT 94.0 08/08/2014 0411   CBG (last 3)  No results for input(s): GLUCAP in the last 72 hours.  Assessment/Plan: S/P Procedure(s) (LRB): VIDEO ASSISTED THORACOSCOPY (VATS) (Left) LEFT UPPER LOBECTOMY (Left) Plan for transfer to step-down: see transfer orders Leave tubes to suction  LOS: 3 days    Vincent Sutton 08/08/2014

## 2014-08-08 NOTE — Procedures (Signed)
Pt placed on home CPAP via the RN.  Pt is comfortable and resting well.

## 2014-08-09 ENCOUNTER — Inpatient Hospital Stay (HOSPITAL_COMMUNITY): Payer: No Typology Code available for payment source

## 2014-08-09 LAB — BASIC METABOLIC PANEL
Anion gap: 5 (ref 5–15)
BUN: 10 mg/dL (ref 6–20)
CHLORIDE: 98 mmol/L — AB (ref 101–111)
CO2: 35 mmol/L — ABNORMAL HIGH (ref 22–32)
CREATININE: 0.73 mg/dL (ref 0.61–1.24)
Calcium: 8.8 mg/dL — ABNORMAL LOW (ref 8.9–10.3)
GFR calc Af Amer: >60 mL/min (ref >60)
GFR calc non Af Amer: >60 mL/min (ref >60)
Glucose, Bld: 102 mg/dL — ABNORMAL HIGH (ref 65–99)
Potassium: 3.8 mmol/L (ref 3.5–5.1)
Sodium: 138 mmol/L (ref 135–145)

## 2014-08-09 LAB — CBC
HCT: 30 % — ABNORMAL LOW (ref 39.0–52.0)
Hemoglobin: 9.6 g/dL — ABNORMAL LOW (ref 13.0–17.0)
MCH: 31.5 pg (ref 26.0–34.0)
MCHC: 32 g/dL (ref 30.0–36.0)
MCV: 98.4 fL (ref 78.0–100.0)
PLATELETS: 238 10*3/uL (ref 150–400)
RBC: 3.05 MIL/uL — ABNORMAL LOW (ref 4.22–5.81)
RDW: 16.3 % — AB (ref 11.5–15.5)
WBC: 6.9 10*3/uL (ref 4.0–10.5)

## 2014-08-09 MED ORDER — POTASSIUM CHLORIDE CRYS ER 20 MEQ PO TBCR
40.0000 meq | EXTENDED_RELEASE_TABLET | Freq: Every day | ORAL | Status: DC
  Administered 2014-08-09 – 2014-08-12 (×4): 40 meq via ORAL
  Filled 2014-08-09 (×4): qty 2

## 2014-08-09 MED ORDER — TIOTROPIUM BROMIDE MONOHYDRATE 18 MCG IN CAPS
18.0000 ug | ORAL_CAPSULE | Freq: Every day | RESPIRATORY_TRACT | Status: DC
  Administered 2014-08-10 – 2014-08-12 (×3): 18 ug via RESPIRATORY_TRACT
  Filled 2014-08-09: qty 5

## 2014-08-09 NOTE — Progress Notes (Addendum)
4 Days Post-Op Procedure(s) (LRB): VIDEO ASSISTED THORACOSCOPY (VATS) (Left) LEFT UPPER LOBECTOMY (Left) Subjective: Persistent large air leak with cough CT drainage 100-200 cc per shift CXR ok desats with activity Less wheezing today Path- stage 1b Objective: Vital signs in last 24 hours: Temp:  [97.5 F (36.4 C)-98 F (36.7 C)] 97.8 F (36.6 C) (06/19 0800) Pulse Rate:  [81-101] 88 (06/19 0900) Cardiac Rhythm:  [-] Sinus tachycardia (06/19 1000) Resp:  [8-27] 18 (06/19 1000) BP: (116-156)/(61-94) 129/75 mmHg (06/19 0900) SpO2:  [91 %-100 %] 93 % (06/19 1000) Weight:  [232 lb 5.8 oz (105.4 kg)] 232 lb 5.8 oz (105.4 kg) (06/19 0500)  Hemodynamic parameters for last 24 hours:  stable  Intake/Output from previous day: 06/18 0701 - 06/19 0700 In: 450 [P.O.:300; I.V.:150] Out: 2570 [Urine:2300; Chest Tube:270] Intake/Output this shift: Total I/O In: -  Out: 125 [Urine:50; Chest Tube:75]  Incision clean, dry  Lab Results:  Recent Labs  08/08/14 0420 08/09/14 0450  WBC 9.4 6.9  HGB 9.4* 9.6*  HCT 29.4* 30.0*  PLT 217 238   BMET:  Recent Labs  08/08/14 0420 08/09/14 0450  NA 135 138  K 4.5 3.8  CL 100* 98*  CO2 31 35*  GLUCOSE 108* 102*  BUN 16 10  CREATININE 0.78 0.73  CALCIUM 8.2* 8.8*    PT/INR: No results for input(s): LABPROT, INR in the last 72 hours. ABG    Component Value Date/Time   PHART 7.335* 08/08/2014 0411   HCO3 32.6* 08/08/2014 0411   TCO2 34 08/08/2014 0411   ACIDBASEDEF 3.0* 08/06/2014 0453   O2SAT 94.0 08/08/2014 0411   CBG (last 3)   Recent Labs  08/08/14 2210  GLUCAP 88    Assessment/Plan: S/P Procedure(s) (LRB): VIDEO ASSISTED THORACOSCOPY (VATS) (Left) LEFT UPPER LOBECTOMY (Left) Mobilize Diuresis Plan for transfer to step-down: see transfer orders   LOS: 4 days    Tharon Aquas Trigt III 08/09/2014

## 2014-08-09 NOTE — Progress Notes (Signed)
CT surgery p.m. Rounds  Patient's pulmonary status improved-ambulated 2 laps around unit without dropping oxygen saturation Airleak significantly improved with cough this evening Should be ready for transfer to stepdown tomorrow

## 2014-08-09 NOTE — Progress Notes (Signed)
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  He has been followed by CVTS r/t residual LUL mass and presented 6/15 for elective VATS with LULobectomy.  Extubated post op but on 6/16 developed worsening hypercarbia/ dyspnea requiring bipap and PCCM consulted.   STUDIES:   SIGNIFICANT EVENTS: 6/15 OR>>> L VATS, LULobectomy   SUBJECTIVE:  Up in chair, stronger this am, feels a bit better as well Air leak is smaller today than 6/18  VITAL SIGNS: Temp:  [97.5 F (36.4 C)-98 F (36.7 C)] 98 F (36.7 C) (06/19 0000) Pulse Rate:  [81-101] 88 (06/19 0700) Resp:  [8-27] 12 (06/19 0700) BP: (116-156)/(61-94) 131/80 mmHg (06/19 0700) SpO2:  [91 %-100 %] 95 % (06/19 0700) Weight:  [105.4 kg (232 lb 5.8 oz)] 105.4 kg (232 lb 5.8 oz) (06/19 0500) HEMODYNAMICS:   VENTILATOR SETTINGS:   INTAKE / OUTPUT:  Intake/Output Summary (Last 24 hours) at 08/09/14 0749 Last data filed at 08/09/14 0600  Gross per 24 hour  Intake    450 ml  Output   2570 ml  Net  -2120 ml    PHYSICAL EXAMINATION: General:  In chair, breathing comfortably on 2L/min HENT: NCAT EOMi PULM: diminished left, CTA R, no wheezing, small air leak CV: regular, no mgr, 70's GI: BS+, soft, nontender Derm: no rash, ext warm no edema Neuro: A&Ox4, maew  LABS:  CBC  Recent Labs Lab 08/07/14 0425 08/08/14 0420 08/09/14 0450  WBC 15.7* 9.4 6.9  HGB 11.2* 9.4* 9.6*  HCT 35.3* 29.4* 30.0*  PLT 245 217 238   Coag's  Recent Labs Lab 08/03/14 1637  APTT 28  INR 1.02   BMET  Recent Labs Lab 08/07/14 0425 08/08/14 0420 08/09/14 0450  NA 136 135 138  K 4.9 4.5 3.8   CL 98* 100* 98*  CO2 31 31 35*  BUN 28* 16 10  CREATININE 1.09 0.78 0.73  GLUCOSE 146* 108* 102*   Electrolytes  Recent Labs Lab 08/07/14 0425 08/08/14 0420 08/09/14 0450  CALCIUM 8.4* 8.2* 8.8*   Sepsis Markers No results for input(s): LATICACIDVEN, PROCALCITON, O2SATVEN in the last 168 hours. ABG  Recent Labs Lab 08/06/14 1337 08/07/14 0751 08/08/14 0411  PHART 7.266* 7.283* 7.335*  PCO2ART 63.3* 66.0* 61.0*  PO2ART 100.0 56.0* 76.0*   Liver Enzymes  Recent Labs Lab 08/03/14 1637 08/07/14 0425  AST 24 24  ALT 30 27  ALKPHOS 66 54  BILITOT 0.4 0.9  ALBUMIN 4.0 3.4*   Cardiac Enzymes No results for input(s): TROPONINI, PROBNP in the last 168 hours. Glucose  Recent Labs Lab 08/08/14 2210  GLUCAP 88    Imaging CXR 6/17 atelectasis left lung, no clear effusion  ASSESSMENT / PLAN:  PULMONARY Acute on chronic hypercarbic respiratory failure > stable, multifactorial  Mild COPD OSA> On CPAP at home (non-compliant)  Squamous cell lung ca - stage IIa - s/p chemo and XRT  L VATS/ LULobectomy 6/15 Atelectasis L lung> likely some degree of mucus plugging Small persistent L air leak > better 6/19 P:   CPAP whenever he sleeps; could consider holding if we believe this would allow air  leak to resolve more quickly Goal O2 saturation 88-92%% Continue duoneb scheduled, hold tiotropium Pulmicort and Brovana Out of bed as much as possible, push IS Pulm hygiene Follow CXR  Chest tubes per CVTS, ? One out 6/19 Hold off on systemic steroids  Minimize sedating meds  CARDIOVASCULAR CVL R IJ 6/15>>>  Tachycardia - mild, improved P:  Monitor   RENAL Hyperkalemia - resolved P:   Monitor BMET and UOP Replace electrolytes as needed Decreased IVF 6/18 since taking good PO   GASTROINTESTINAL No active issue  P:   Advance diet PPI   HEMATOLOGIC Leukocytosis - post op reaction, improving P:  Monitor for bleeding F/u cbc  lovenox for DVT  prophylaxis  INFECTIOUS No evidence active infection  P:   Monitor wbc, fever curve off abx   ENDOCRINE Hyperglycemia - no known hx DM   P:   Monitor glucose SSI if  > 180   NEUROLOGIC Post op pain  Sonorous from narcotics, mildly hypercarbic > resolved P:   Pain management per TCTS Use non-narcotic meds as able Avoid oversedation    FAMILY  - Updates:  Wife updated bedside 6/17, patient on 6/18 and 21  - Inter-disciplinary family meet or Palliative Care meeting due by:  6/23   Baltazar Apo, MD, PhD 08/09/2014, 7:49 AM [facility] [phone] or if no answer [phone]

## 2014-08-10 ENCOUNTER — Inpatient Hospital Stay (HOSPITAL_COMMUNITY): Payer: No Typology Code available for payment source

## 2014-08-10 DIAGNOSIS — J441 Chronic obstructive pulmonary disease with (acute) exacerbation: Secondary | ICD-10-CM

## 2014-08-10 LAB — CBC
HCT: 30.9 % — ABNORMAL LOW (ref 39.0–52.0)
HEMOGLOBIN: 10.1 g/dL — AB (ref 13.0–17.0)
MCH: 31.8 pg (ref 26.0–34.0)
MCHC: 32.7 g/dL (ref 30.0–36.0)
MCV: 97.2 fL (ref 78.0–100.0)
PLATELETS: 280 10*3/uL (ref 150–400)
RBC: 3.18 MIL/uL — AB (ref 4.22–5.81)
RDW: 15.9 % — ABNORMAL HIGH (ref 11.5–15.5)
WBC: 7.9 10*3/uL (ref 4.0–10.5)

## 2014-08-10 LAB — BASIC METABOLIC PANEL
Anion gap: 5 (ref 5–15)
BUN: 8 mg/dL (ref 6–20)
CALCIUM: 8.8 mg/dL — AB (ref 8.9–10.3)
CO2: 36 mmol/L — AB (ref 22–32)
CREATININE: 0.76 mg/dL (ref 0.61–1.24)
Chloride: 94 mmol/L — ABNORMAL LOW (ref 101–111)
GFR calc Af Amer: >60 mL/min (ref >60)
GFR calc non Af Amer: >60 mL/min (ref >60)
GLUCOSE: 122 mg/dL — AB (ref 65–99)
Potassium: 4 mmol/L (ref 3.5–5.1)
Sodium: 135 mmol/L (ref 135–145)

## 2014-08-10 NOTE — Progress Notes (Signed)
Pt transferred to 2W23 with belongings. Report given to receiving RN and all questions answered. VSS during transfer.  RN and NT at bedside to receive pt.  Pt assisted to chair in new room. Family updated on patient's location.

## 2014-08-10 NOTE — Progress Notes (Signed)
5 Days Post-Op Procedure(s) (LRB): VIDEO ASSISTED THORACOSCOPY (VATS) (Left) LEFT UPPER LOBECTOMY (Left) Subjective: No complaints this AM. "I feel pretty good"   Objective: Vital signs in last 24 hours: Temp:  [97.3 F (36.3 C)-98 F (36.7 C)] 97.9 F (36.6 C) (06/20 0400) Pulse Rate:  [80-106] 81 (06/20 0700) Cardiac Rhythm:  [-] Heart block (06/20 0700) Resp:  [9-24] 12 (06/20 0700) BP: (99-144)/(45-86) 131/67 mmHg (06/20 0700) SpO2:  [90 %-96 %] 96 % (06/20 0700)  Hemodynamic parameters for last 24 hours:    Intake/Output from previous day: 06/19 0701 - 06/20 0700 In: -  Out: 5686 [Urine:2150; Chest Tube:305] Intake/Output this shift:    General appearance: alert, cooperative and no distress Neurologic: intact Heart: regular rate and rhythm Lungs: diminished breath sounds left base Abdomen: normal findings: soft, non-tender Wound: clean and dry trace air leak  Lab Results:  Recent Labs  08/09/14 0450 08/10/14 0430  WBC 6.9 7.9  HGB 9.6* 10.1*  HCT 30.0* 30.9*  PLT 238 280   BMET:  Recent Labs  08/09/14 0450 08/10/14 0430  NA 138 135  K 3.8 4.0  CL 98* 94*  CO2 35* 36*  GLUCOSE 102* 122*  BUN 10 8  CREATININE 0.73 0.76  CALCIUM 8.8* 8.8*    PT/INR: No results for input(s): LABPROT, INR in the last 72 hours. ABG    Component Value Date/Time   PHART 7.335* 08/08/2014 0411   HCO3 32.6* 08/08/2014 0411   TCO2 34 08/08/2014 0411   ACIDBASEDEF 3.0* 08/06/2014 0453   O2SAT 94.0 08/08/2014 0411   CBG (last 3)   Recent Labs  08/08/14 2210  GLUCAP 88    Assessment/Plan: S/P Procedure(s) (LRB): VIDEO ASSISTED THORACOSCOPY (VATS) (Left) LEFT UPPER LOBECTOMY (Left) Plan for transfer to step-down: see transfer orders   Doing well CV stable A little more atelectasis on the left today Minimal air leak, drainage down- will dc posterior tube today and probably anterior tube tomorrow SCD + enoxaparin for DVT prophylaxis Continue  ambulation Patient informed of path results   LOS: 5 days    [name] 08/10/2014

## 2014-08-10 NOTE — Progress Notes (Signed)
Patient has home CPAP machine with home mask with home settings. Pt has 2 LPM nasal cannula on under mask. Pt resting comfortable. RT will continue to monitor.

## 2014-08-10 NOTE — Progress Notes (Signed)
08/10/2014  4:15 PM Nursing note Pt. Ambulated 300 ft with RW, RN and on 2L [place]. Pt. Tolerated well. Encouraged one more walk this evening.  Jaquann Guarisco, Arville Lime

## 2014-08-10 NOTE — Progress Notes (Signed)
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  He has been followed by CVTS r/t residual LUL mass and presented 6/15 for elective VATS with LULobectomy.  Extubated post op but on 6/16 developed worsening hypercarbia/ dyspnea requiring bipap and PCCM consulted.   STUDIES:   SIGNIFICANT EVENTS: 6/15 OR>>> L VATS, LULobectomy >> T1B invasive squamous   SUBJECTIVE:  C/o chest pain with coughing Air leak +  VITAL SIGNS: Temp:  [97.3 F (36.3 C)-98 F (36.7 C)] 97.6 F (36.4 C) (06/20 0801) Pulse Rate:  [80-106] 81 (06/20 0700) Resp:  [9-24] 12 (06/20 0700) BP: (99-144)/(45-85) 131/67 mmHg (06/20 0700) SpO2:  [90 %-96 %] 96 % (06/20 0700) HEMODYNAMICS:   VENTILATOR SETTINGS:   INTAKE / OUTPUT:  Intake/Output Summary (Last 24 hours) at 08/10/14 0830 Last data filed at 08/10/14 0630  Gross per 24 hour  Intake      0 ml  Output   2380 ml  Net  -2380 ml    PHYSICAL EXAMINATION: General:  Supine in bed , breathing comfortably on 2L/min HENT: NCAT EOMi PULM: diminished left, CTA R, scattered rhonchi, small air leak CV: regular, no mgr, 70's GI: BS+, soft, nontender Derm: no rash, ext warm no edema Neuro: A&Ox4, maew  LABS:  CBC  Recent Labs Lab 08/08/14 0420 08/09/14 0450 08/10/14 0430  WBC 9.4 6.9 7.9  HGB 9.4* 9.6* 10.1*  HCT 29.4* 30.0* 30.9*  PLT 217 238 280   Coag's  Recent Labs Lab 08/03/14 1637  APTT 28  INR 1.02   BMET  Recent Labs Lab 08/08/14 0420 08/09/14 0450 08/10/14 0430  NA 135 138 135  K 4.5 3.8 4.0  CL 100* 98* 94*  CO2 31 35* 36*  BUN '16 10 8  '$ CREATININE 0.78 0.73 0.76  GLUCOSE 108* 102*  122*   Electrolytes  Recent Labs Lab 08/08/14 0420 08/09/14 0450 08/10/14 0430  CALCIUM 8.2* 8.8* 8.8*   Sepsis Markers No results for input(s): LATICACIDVEN, PROCALCITON, O2SATVEN in the last 168 hours. ABG  Recent Labs Lab 08/06/14 1337 08/07/14 0751 08/08/14 0411  PHART 7.266* 7.283* 7.335*  PCO2ART 63.3* 66.0* 61.0*  PO2ART 100.0 56.0* 76.0*   Liver Enzymes  Recent Labs Lab 08/03/14 1637 08/07/14 0425  AST 24 24  ALT 30 27  ALKPHOS 66 54  BILITOT 0.4 0.9  ALBUMIN 4.0 3.4*   Cardiac Enzymes No results for input(s): TROPONINI, PROBNP in the last 168 hours. Glucose  Recent Labs Lab 08/08/14 2210  GLUCAP 88    Imaging CXR 6/20 volume loss left lung, no clear effusion  ASSESSMENT / PLAN:  PULMONARY Acute on chronic hypercarbic respiratory failure > stable, multifactorial  COPD -gold C OSA> On CPAP at home (non-compliant)  Squamous cell lung ca - stage IIa - s/p chemo and XRT  L VATS/ LULobectomy 6/15 Atelectasis L lung> likely some degree of mucus plugging Small persistent L air leak > better 6/19 P:   CPAP whenever he sleeps; could consider holding if we believe this would allow air leak to resolve more quickly Goal O2 saturation 88-92%% Continue duoneb scheduled, hold tiotropium Pulmicort and Brovana  mobilise, push IS Pulm hygiene Chest tubes per CVTS, ? One out 6/19 Hold off on systemic steroids  Minimize sedating meds  CARDIOVASCULAR CVL R IJ 6/15>>>  Tachycardia - mild, improved P:  Monitor   RENAL Hyperkalemia - resolved P:   Monitor BMET and UOP Replace electrolytes as needed Decreased IVF 6/18 since taking good PO   NEUROLOGIC Post op pain  Sonorous from narcotics, mildly hypercarbic > resolved P:   Pain management per TCTS Use non-narcotic meds as able Avoid oversedation   Summary -  Moderate COPD & OSA , having expected course post op lobectomy. Hope he will continue to improve. Holding off on steroids for tissue  healing  Kara Mead MD. FCCP. [facility] Pager [phone] If no response call [phone]    08/10/2014, 8:30 AM

## 2014-08-11 ENCOUNTER — Inpatient Hospital Stay (HOSPITAL_COMMUNITY): Payer: No Typology Code available for payment source

## 2014-08-11 LAB — BASIC METABOLIC PANEL
ANION GAP: 9 (ref 5–15)
BUN: 11 mg/dL (ref 6–20)
CO2: 31 mmol/L (ref 22–32)
Calcium: 9.2 mg/dL (ref 8.9–10.3)
Chloride: 96 mmol/L — ABNORMAL LOW (ref 101–111)
Creatinine, Ser: 0.85 mg/dL (ref 0.61–1.24)
GFR calc Af Amer: >60 mL/min (ref >60)
Glucose, Bld: 118 mg/dL — ABNORMAL HIGH (ref 65–99)
Potassium: 3.9 mmol/L (ref 3.5–5.1)
Sodium: 136 mmol/L (ref 135–145)

## 2014-08-11 LAB — CBC
HCT: 32 % — ABNORMAL LOW (ref 39.0–52.0)
Hemoglobin: 10.5 g/dL — ABNORMAL LOW (ref 13.0–17.0)
MCH: 31.8 pg (ref 26.0–34.0)
MCHC: 32.8 g/dL (ref 30.0–36.0)
MCV: 97 fL (ref 78.0–100.0)
PLATELETS: 282 10*3/uL (ref 150–400)
RBC: 3.3 MIL/uL — ABNORMAL LOW (ref 4.22–5.81)
RDW: 15.8 % — ABNORMAL HIGH (ref 11.5–15.5)
WBC: 8.8 10*3/uL (ref 4.0–10.5)

## 2014-08-11 NOTE — Progress Notes (Signed)
Utilization review completed.  

## 2014-08-11 NOTE — Progress Notes (Signed)
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  He has been followed by CVTS r/t residual LUL mass and presented 6/15 for elective VATS with LULobectomy.  Extubated post op but on 6/16 developed worsening hypercarbia/ dyspnea requiring bipap and PCCM consulted.   STUDIES:   SIGNIFICANT EVENTS: 6/15 OR>>> L VATS, LULobectomy >> T1B invasive squamous   SUBJECTIVE:  C/o decreased chest pain / cough Dyspnea 'ok' Air leak +  VITAL SIGNS: Temp:  [97.3 F (36.3 C)-98.4 F (36.9 C)] 98.3 F (36.8 C) (06/21 0425) Pulse Rate:  [84-96] 95 (06/21 0935) Resp:  [14-18] 14 (06/21 0935) BP: (117-131)/(66-78) 119/70 mmHg (06/21 0425) SpO2:  [94 %-97 %] 94 % (06/21 0935) HEMODYNAMICS:   VENTILATOR SETTINGS:   INTAKE / OUTPUT:  Intake/Output Summary (Last 24 hours) at 08/11/14 1027 Last data filed at 08/11/14 0900  Gross per 24 hour  Intake    720 ml  Output    900 ml  Net   -180 ml    PHYSICAL EXAMINATION: General:  Up in chair , breathing comfortably on RA HENT: NCAT EOMi PULM: diminished left, CTA R, decreased rhonchi, small air leak CV: regular, no mgr, 70's GI: BS+, soft, nontender Derm: no rash, ext warm no edema Neuro: A&Ox4, maew  LABS:  CBC  Recent Labs Lab 08/09/14 0450 08/10/14 0430 08/11/14 0314  WBC 6.9 7.9 8.8  HGB 9.6* 10.1* 10.5*  HCT 30.0* 30.9* 32.0*  PLT 238 280 282   Coag's No results for input(s): APTT, INR in the last 168 hours. BMET  Recent Labs Lab 08/09/14 0450 08/10/14 0430 08/11/14 0314  NA 138 135 136  K 3.8 4.0 3.9  CL 98* 94* 96*  CO2 35* 36* 31  BUN '10 8 11  '$ CREATININE 0.73 0.76 0.85   GLUCOSE 102* 122* 118*   Electrolytes  Recent Labs Lab 08/09/14 0450 08/10/14 0430 08/11/14 0314  CALCIUM 8.8* 8.8* 9.2   Sepsis Markers No results for input(s): LATICACIDVEN, PROCALCITON, O2SATVEN in the last 168 hours. ABG  Recent Labs Lab 08/06/14 1337 08/07/14 0751 08/08/14 0411  PHART 7.266* 7.283* 7.335*  PCO2ART 63.3* 66.0* 61.0*  PO2ART 100.0 56.0* 76.0*   Liver Enzymes  Recent Labs Lab 08/07/14 0425  AST 24  ALT 27  ALKPHOS 54  BILITOT 0.9  ALBUMIN 3.4*   Cardiac Enzymes No results for input(s): TROPONINI, PROBNP in the last 168 hours. Glucose  Recent Labs Lab 08/08/14 2210  GLUCAP 88    Imaging CXR 6/20 volume loss left lung, no clear effusion  ASSESSMENT / PLAN:  PULMONARY Acute on chronic hypercarbic respiratory failure > stable, multifactorial  COPD -gold C OSA> On CPAP at home (non-compliant)  Squamous cell lung ca - stage IIa - s/p chemo and XRT  L VATS/ LULobectomy 6/15 Atelectasis L lung> likely some degree of mucus plugging Small persistent L air leak > better 6/19 P:   CPAP noct Continue duoneb scheduled, resume tiotropium on dc Pulmicort and Brovana while inpt mobilise, push IS Chest tubes per CVTS Hold off on systemic steroids     NEUROLOGIC Post op pain  Sonorous from narcotics,  mildly hypercarbic > resolved P:   Pain management per [facility] Use non-narcotic meds as able   Summary -  Moderate COPD & OSA , having expected course post op lobectomy. Out patient FU has been arranged in 6 wks, resume spiriva on dc  PCCM to sign off   Kara Mead MD. Shade Flood. [facility] Pager [phone] If no response call [phone]    08/11/2014, 10:27 AM

## 2014-08-11 NOTE — Progress Notes (Addendum)
[address]       [place] [address]             [phone]      6 Days Post-Op Procedure(s) (LRB): VIDEO ASSISTED THORACOSCOPY (VATS) (Left) LEFT UPPER LOBECTOMY (Left) Subjective: conts to progress, upset d/t a death in his family  Objective: Vital signs in last 24 hours: Temp:  [97.3 F (36.3 C)-98.4 F (36.9 C)] 98.3 F (36.8 C) (06/21 0425) Pulse Rate:  [83-97] 95 (06/21 0425) Cardiac Rhythm:  [-] Normal sinus rhythm (06/20 2256) Resp:  [14-21] 16 (06/21 0425) BP: (91-134)/(54-81) 119/70 mmHg (06/21 0425) SpO2:  [96 %-100 %] 97 % (06/21 0425)  Hemodynamic parameters for last 24 hours:    Intake/Output from previous day: 06/20 0701 - 06/21 0700 In: 720 [P.O.:720] Out: 830 [Urine:800; Chest Tube:30] Intake/Output this shift: Total I/O In: -  Out: 80 [Chest Tube:80]  General appearance: alert, cooperative and no distress Heart: regular rate and rhythm Lungs: coarse throughout Abdomen: benign Extremities: no edema Wound: incis heal;ing well  Lab Results:  Recent Labs  08/10/14 0430 08/11/14 0314  WBC 7.9 8.8  HGB 10.1* 10.5*  HCT 30.9* 32.0*  PLT 280 282   BMET:  Recent Labs  08/10/14 0430 08/11/14 0314  NA 135 136  K 4.0 3.9  CL 94* 96*  CO2 36* 31  GLUCOSE 122* 118*  BUN 8 11  CREATININE 0.76 0.85  CALCIUM 8.8* 9.2    PT/INR: No results for input(s): LABPROT, INR in the last 72 hours. ABG    Component Value Date/Time   PHART 7.335* 08/08/2014 0411   HCO3 32.6* 08/08/2014 0411   TCO2 34 08/08/2014 0411   ACIDBASEDEF 3.0* 08/06/2014 0453   O2SAT 94.0 08/08/2014 0411   CBG (last 3)   Recent Labs  08/08/14 2210  GLUCAP 88    Meds Scheduled Meds: . arformoterol  15 mcg Nebulization BID  . bisacodyl  10 mg Oral Daily  . budesonide (PULMICORT) nebulizer solution  0.5 mg Nebulization BID  . enoxaparin (LOVENOX) injection  40 mg Subcutaneous Q24H  . furosemide  40 mg Oral Daily  . guaiFENesin  1,200 mg  Oral BID  . pantoprazole  40 mg Oral Daily  . potassium chloride  40 mEq Oral Daily  . senna-docusate  1 tablet Oral QHS  . sorbitol  60 mL Oral Once  . tamsulosin  0.4 mg Oral Daily  . tiotropium  18 mcg Inhalation Daily   Continuous Infusions: . sodium chloride Stopped (08/09/14 1900)   PRN Meds:.albuterol, ALPRAZolam, ondansetron (ZOFRAN) IV, oxyCODONE, potassium chloride, traMADol  Xrays Dg Chest Port 1 View  08/10/2014   CLINICAL DATA:  Chest tube.  EXAM: PORTABLE CHEST - 1 VIEW  COMPARISON:  08/09/2014.  FINDINGS: Left chest tubes, right IJ line in stable position. Postsurgical changes left lung. Low lung volumes with basilar atelectasis. Pleural parenchymal thickening consistent scarring. No pneumothorax. Stable mild cardiomegaly. No pulmonary venous congestion. No acute osseous abnormality .  IMPRESSION: 1. Lines and tubes in stable position. 2 left chest tubes are unchanged in appearance. No pneumothorax. 2. Postsurgical changes left lung. Mild bibasilar atelectasis again noted. Pleural parenchymal thickening noted consistent with scarring again noted. 3. Stable mild cardiomegaly.  No pulmonary venous congestion .   Electronically Signed   By: Marcello Moores  Register   On: 08/10/2014 07:44   Chest tube- no air leak, 150 ml/ yesterday CXR, no pntx, stable  Assessment/Plan: S/P Procedure(s) (LRB):  VIDEO ASSISTED THORACOSCOPY (VATS) (Left) LEFT UPPER LOBECTOMY (Left)  1 doing well, d/c chest tube 2 push pulm toilet/ambulation as able 3 he hopes to go to funeral tomorrow if possible but understands must be medically able to be discharged     LOS: 6 days    GOLD,WAYNE E 08/11/2014  Patient seen and examined, agree with above Dc chest tube  Remo Lipps C. Roxan Hockey, MD [facility] [phone]

## 2014-08-11 NOTE — Progress Notes (Signed)
08/11/2014  11:08 AM Chest tube removed per order and per protocol. Pt. Tolerated well. Occlusive dressing applied. STAT PCXR per orders. Will continue to closely monitor patient.  Enrica Corliss, Arville Lime

## 2014-08-11 NOTE — Care Management Note (Signed)
Case Management Note Marvetta Gibbons RN, BSN Unit 2W-Case Manager [phone]  Patient Details  Name: Vincent Sutton MRN: [id number] Date of Birth: 08-13-1960  Subjective/Objective:   Pt admitted s/p VATS                 Action/Plan: PTA pt lived at home with spouse- anticipate return home when medically stable  Expected Discharge Date:       08/12/14           Expected Discharge Plan:  Home/Self Care  In-House Referral:     Discharge planning Services  CM Consult  Post Acute Care Choice:    Choice offered to:     DME Arranged:    DME Agency:     HH Arranged:    [place] Agency:     Status of Service:  In process, will continue to follow  Medicare Important Message Given:    Date Medicare IM Given:    Medicare IM give by:    Date Additional Medicare IM Given:    Additional Medicare Important Message give by:     If discussed at [place] of Stay Meetings, dates discussed:  08/11/14  Additional Comments:  Dawayne Patricia, RN 08/11/2014, 9:56 AM

## 2014-08-12 ENCOUNTER — Inpatient Hospital Stay (HOSPITAL_COMMUNITY): Payer: No Typology Code available for payment source

## 2014-08-12 LAB — CBC
HCT: 32.7 % — ABNORMAL LOW (ref 39.0–52.0)
Hemoglobin: 10.9 g/dL — ABNORMAL LOW (ref 13.0–17.0)
MCH: 31.7 pg (ref 26.0–34.0)
MCHC: 33.3 g/dL (ref 30.0–36.0)
MCV: 95.1 fL (ref 78.0–100.0)
Platelets: 321 10*3/uL (ref 150–400)
RBC: 3.44 MIL/uL — ABNORMAL LOW (ref 4.22–5.81)
RDW: 15.7 % — AB (ref 11.5–15.5)
WBC: 9.3 10*3/uL (ref 4.0–10.5)

## 2014-08-12 LAB — BASIC METABOLIC PANEL
ANION GAP: 11 (ref 5–15)
BUN: 15 mg/dL (ref 6–20)
CHLORIDE: 95 mmol/L — AB (ref 101–111)
CO2: 29 mmol/L (ref 22–32)
CREATININE: 1 mg/dL (ref 0.61–1.24)
Calcium: 9.3 mg/dL (ref 8.9–10.3)
Glucose, Bld: 113 mg/dL — ABNORMAL HIGH (ref 65–99)
Potassium: 4 mmol/L (ref 3.5–5.1)
Sodium: 135 mmol/L (ref 135–145)

## 2014-08-12 MED ORDER — TAMSULOSIN HCL 0.4 MG PO CAPS: 0.4000 mg | ORAL_CAPSULE | Freq: Every day | ORAL | Status: DC

## 2014-08-12 MED ORDER — OXYCODONE HCL 5 MG PO TABS: 5.0000 mg | ORAL_TABLET | ORAL | Status: DC | PRN

## 2014-08-12 NOTE — Progress Notes (Addendum)
[address]       [place] [address]             [phone]      7 Days Post-Op Procedure(s) (LRB): VIDEO ASSISTED THORACOSCOPY (VATS) (Left) LEFT UPPER LOBECTOMY (Left) Subjective: Feels pretty well physically   Objective: Vital signs in last 24 hours: Temp:  [98.2 F (36.8 C)-98.6 F (37 C)] 98.6 F (37 C) (06/22 0440) Pulse Rate:  [92-108] 92 (06/22 0440) Cardiac Rhythm:  [-] Normal sinus rhythm;Sinus tachycardia (06/21 2031) Resp:  [14-19] 19 (06/22 0440) BP: (112-117)/(62-84) 117/70 mmHg (06/22 0440) SpO2:  [91 %-100 %] 92 % (06/22 0440)  Hemodynamic parameters for last 24 hours:    Intake/Output from previous day: 06/21 0701 - 06/22 0700 In: 540 [P.O.:540] Out: 80 [Chest Tube:80] Intake/Output this shift:    General appearance: alert, cooperative and no distress Heart: regular rate and rhythm Lungs: mildly dim in L base Abdomen: benign Extremities: no edema Wound: incis healing well  Lab Results:  Recent Labs  08/11/14 0314 08/12/14 0416  WBC 8.8 9.3  HGB 10.5* 10.9*  HCT 32.0* 32.7*  PLT 282 321   BMET:  Recent Labs  08/11/14 0314 08/12/14 0416  NA 136 135  K 3.9 4.0  CL 96* 95*  CO2 31 29  GLUCOSE 118* 113*  BUN 11 15  CREATININE 0.85 1.00  CALCIUM 9.2 9.3    PT/INR: No results for input(s): LABPROT, INR in the last 72 hours. ABG    Component Value Date/Time   PHART 7.335* 08/08/2014 0411   HCO3 32.6* 08/08/2014 0411   TCO2 34 08/08/2014 0411   ACIDBASEDEF 3.0* 08/06/2014 0453   O2SAT 94.0 08/08/2014 0411   CBG (last 3)  No results for input(s): GLUCAP in the last 72 hours.  Meds Scheduled Meds: . arformoterol  15 mcg Nebulization BID  . bisacodyl  10 mg Oral Daily  . budesonide (PULMICORT) nebulizer solution  0.5 mg Nebulization BID  . enoxaparin (LOVENOX) injection  40 mg Subcutaneous Q24H  . furosemide  40 mg Oral Daily  . guaiFENesin  1,200 mg Oral BID  . pantoprazole  40 mg Oral Daily  .  potassium chloride  40 mEq Oral Daily  . senna-docusate  1 tablet Oral QHS  . sorbitol  60 mL Oral Once  . tamsulosin  0.4 mg Oral Daily  . tiotropium  18 mcg Inhalation Daily   Continuous Infusions: . sodium chloride Stopped (08/09/14 1900)   PRN Meds:.albuterol, ALPRAZolam, ondansetron (ZOFRAN) IV, oxyCODONE, potassium chloride, traMADol  Xrays Dg Chest Port 1 View  08/11/2014   CLINICAL DATA:  Status post lobectomy of the lung. Chest tube removal  EXAM: PORTABLE CHEST - 1 VIEW  COMPARISON:  08/11/2014  FINDINGS: No evidence of pneumothorax post chest tube removal.  Postoperative volume loss and increased density in the left chest is stable.  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  Stable normal heart size. Stable postoperative distortion of the upper mediastinal contours.  IMPRESSION: 1. No pneumothorax after chest tube removal. 2. Stable postoperative appearance of the chest. 3. Bullous emphysema.   Electronically Signed   By: Monte Fantasia M.D.   On: 08/11/2014 12:32   Dg Chest Port 1 View  08/11/2014   CLINICAL DATA:  Status post left upper lobectomy for lung carcinoma  EXAM: PORTABLE CHEST - 1 VIEW  COMPARISON:  August 10, 2014  FINDINGS: Postoperative changes noted on the left. One of the  chest tubes has been removed on the left. The of the chest tube remains. No pneumothorax. Central catheter is been removed. There is underlying emphysematous change with areas of scarring on the right. Heart is upper normal in size with pulmonary vascularity within normal limits. No adenopathy.  IMPRESSION: No pneumothorax. Single chest tube remains on the left. Underlying emphysema with scarring on the right. No edema or consolidation. No change in cardiac silhouette.   Electronically Signed   By: Lowella Grip III M.D.   On: 08/11/2014 08:03    Assessment/Plan: S/P Procedure(s) (LRB): VIDEO ASSISTED THORACOSCOPY (VATS) (Left) LEFT UPPER LOBECTOMY (Left)  1 doing well, quite stable. We will  d/c home today     LOS: 7 days    GOLD,WAYNE E 08/12/2014  Patient seen and examined, agree with above Home today  [place]. Roxan Hockey, MD [facility] [phone]

## 2014-08-12 NOTE — Progress Notes (Signed)
Patient ambulated 500 ft with minimal help. O2 level >95% at rest. Patient became slightly SOB with O2 level of >90%. Educated patient on ambulating, using incentive spirometer, and using deep breathing technique. Will continue to monitor.   Domingo Dimes RN

## 2014-08-12 NOTE — Progress Notes (Signed)
Patient education done on D/C. Education done on VATs procedure, signs and symptoms of infection, progression of activity, and medications. IV was removed. Tele monitor was removed. Patient belongings given to wife. Patient wheeled to front to D/C home with wife and grandkids.   Domingo Dimes RN

## 2014-08-17 ENCOUNTER — Other Ambulatory Visit: Payer: Self-pay | Admitting: *Deleted

## 2014-08-18 ENCOUNTER — Ambulatory Visit: Payer: Self-pay | Admitting: *Deleted

## 2014-08-18 ENCOUNTER — Other Ambulatory Visit: Payer: Self-pay | Admitting: *Deleted

## 2014-08-18 DIAGNOSIS — Z902 Acquired absence of lung [part of]: Secondary | ICD-10-CM

## 2014-08-18 DIAGNOSIS — G8918 Other acute postprocedural pain: Secondary | ICD-10-CM

## 2014-08-18 DIAGNOSIS — C3490 Malignant neoplasm of unspecified part of unspecified bronchus or lung: Secondary | ICD-10-CM

## 2014-08-18 DIAGNOSIS — Z4802 Encounter for removal of sutures: Secondary | ICD-10-CM

## 2014-08-18 DIAGNOSIS — C3412 Malignant neoplasm of upper lobe, left bronchus or lung: Secondary | ICD-10-CM

## 2014-08-18 MED ORDER — OXYCODONE HCL 5 MG PO TABS: 5.0000 mg | ORAL_TABLET | ORAL | Status: DC | PRN

## 2014-08-18 NOTE — Anesthesia Postprocedure Evaluation (Signed)
  Anesthesia Post-op Note  Patient: Vincent Sutton  Procedure(s) Performed: Procedure(s): VIDEO ASSISTED THORACOSCOPY (VATS) (Left) LEFT UPPER LOBECTOMY (Left)  Patient Location: SICU  Anesthesia Type:General  Level of Consciousness: awake, alert , oriented and patient cooperative  Airway and Oxygen Therapy: Patient connected to face mask  Post-op Pain: mild  Post-op Assessment: Post-op Vital signs reviewed, Patient's Cardiovascular Status Stable, Respiratory Function Stable, Patent Airway and Pain level controlled              Post-op Vital Signs: stable  Last Vitals:  Filed Vitals:   08/12/14 0810  BP:   Pulse: 34  Temp:   Resp: 18    Complications: No apparent anesthesia complications

## 2014-08-18 NOTE — Progress Notes (Signed)
Vincent Sutton returns s/p left upper lobectomy for suture removal from 2 previous chest tube sites.  This was easily done.  These sites as well as his small thoracotomy incision are well healed.  He is having the usual post op thoracotomy soreness, numbness, and tingling and was reassured. Appetite and bowels are good. A new pain med script will be mailed to him. He will return as scheduled with a cxr.

## 2014-08-19 ENCOUNTER — Other Ambulatory Visit: Payer: Self-pay | Admitting: Thoracic Surgery (Cardiothoracic Vascular Surgery)

## 2014-08-19 DIAGNOSIS — C3490 Malignant neoplasm of unspecified part of unspecified bronchus or lung: Secondary | ICD-10-CM

## 2014-08-20 ENCOUNTER — Other Ambulatory Visit: Payer: Self-pay | Admitting: *Deleted

## 2014-08-25 ENCOUNTER — Other Ambulatory Visit: Payer: Self-pay | Admitting: *Deleted

## 2014-08-25 ENCOUNTER — Telehealth: Payer: Self-pay | Admitting: Internal Medicine

## 2014-08-25 ENCOUNTER — Encounter: Payer: Self-pay | Admitting: Thoracic Surgery (Cardiothoracic Vascular Surgery)

## 2014-08-25 ENCOUNTER — Ambulatory Visit (INDEPENDENT_AMBULATORY_CARE_PROVIDER_SITE_OTHER): Payer: Self-pay | Admitting: Thoracic Surgery (Cardiothoracic Vascular Surgery)

## 2014-08-25 ENCOUNTER — Ambulatory Visit
Admission: RE | Admit: 2014-08-25 | Discharge: 2014-08-25 | Disposition: A | Discharge status: Home / Self Care | Payer: 59 | Source: Ambulatory Visit | Attending: Thoracic Surgery (Cardiothoracic Vascular Surgery) | Admitting: Thoracic Surgery (Cardiothoracic Vascular Surgery)

## 2014-08-25 VITALS — BP 118/79 | HR 75 | Resp 20 | Ht 68.0 in | Wt 226.0 lb

## 2014-08-25 DIAGNOSIS — C3492 Malignant neoplasm of unspecified part of left bronchus or lung: Secondary | ICD-10-CM

## 2014-08-25 DIAGNOSIS — Z902 Acquired absence of lung [part of]: Secondary | ICD-10-CM

## 2014-08-25 DIAGNOSIS — G8918 Other acute postprocedural pain: Secondary | ICD-10-CM

## 2014-08-25 DIAGNOSIS — Z9889 Other specified postprocedural states: Secondary | ICD-10-CM

## 2014-08-25 DIAGNOSIS — C3490 Malignant neoplasm of unspecified part of unspecified bronchus or lung: Secondary | ICD-10-CM

## 2014-08-25 MED ORDER — OXYCODONE HCL 5 MG PO TABS: 5.0000 mg | ORAL_TABLET | ORAL | Status: DC | PRN

## 2014-08-25 NOTE — Telephone Encounter (Signed)
s.w. pt and advised on Aug appt....pt ok and aware °
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
## 2014-08-27 ENCOUNTER — Other Ambulatory Visit: Payer: Self-pay | Admitting: *Deleted

## 2014-09-07 ENCOUNTER — Other Ambulatory Visit: Payer: Self-pay

## 2014-09-07 DIAGNOSIS — C3492 Malignant neoplasm of unspecified part of left bronchus or lung: Secondary | ICD-10-CM

## 2014-09-07 DIAGNOSIS — G8918 Other acute postprocedural pain: Secondary | ICD-10-CM

## 2014-09-07 DIAGNOSIS — Z902 Acquired absence of lung [part of]: Secondary | ICD-10-CM

## 2014-09-07 MED ORDER — OXYCODONE HCL 5 MG PO TABS: 5.0000 mg | ORAL_TABLET | Freq: Four times a day (QID) | ORAL | Status: DC | PRN

## 2014-09-07 NOTE — Telephone Encounter (Signed)
RX refill for Oxycodone 5 mg given. Patient will pick up at front desk.

## 2014-09-10 ENCOUNTER — Other Ambulatory Visit: Payer: Self-pay | Admitting: *Deleted

## 2014-09-10 DIAGNOSIS — C3492 Malignant neoplasm of unspecified part of left bronchus or lung: Secondary | ICD-10-CM

## 2014-09-11 ENCOUNTER — Telehealth: Payer: Self-pay | Admitting: Internal Medicine

## 2014-09-11 NOTE — Telephone Encounter (Signed)
s.w. pt and advised on aug appt.Marland KitchenMarland KitchenMarland KitchenMarland Kitchenpt ok and aware

## 2014-09-15 ENCOUNTER — Telehealth: Payer: Self-pay | Admitting: Internal Medicine

## 2014-09-15 NOTE — Telephone Encounter (Signed)
s.w. pt wife and confirmed appts

## 2014-09-22 ENCOUNTER — Ambulatory Visit: Payer: 59 | Admitting: Internal Medicine

## 2014-09-28 ENCOUNTER — Ambulatory Visit (HOSPITAL_COMMUNITY)
Admission: RE | Admit: 2014-09-28 | Discharge: 2014-09-28 | Disposition: A | Discharge status: Home / Self Care | Payer: 59 | Source: Ambulatory Visit | Attending: Internal Medicine | Admitting: Internal Medicine

## 2014-09-28 ENCOUNTER — Encounter (HOSPITAL_COMMUNITY): Payer: Self-pay

## 2014-09-28 ENCOUNTER — Other Ambulatory Visit: Payer: Self-pay

## 2014-09-28 DIAGNOSIS — C3492 Malignant neoplasm of unspecified part of left bronchus or lung: Secondary | ICD-10-CM

## 2014-09-28 DIAGNOSIS — R590 Localized enlarged lymph nodes: Secondary | ICD-10-CM | POA: Diagnosis not present

## 2014-09-28 DIAGNOSIS — Z08 Encounter for follow-up examination after completed treatment for malignant neoplasm: Secondary | ICD-10-CM | POA: Diagnosis not present

## 2014-09-28 DIAGNOSIS — K760 Fatty (change of) liver, not elsewhere classified: Secondary | ICD-10-CM | POA: Diagnosis not present

## 2014-09-28 DIAGNOSIS — Z902 Acquired absence of lung [part of]: Secondary | ICD-10-CM

## 2014-09-28 DIAGNOSIS — G8918 Other acute postprocedural pain: Secondary | ICD-10-CM

## 2014-09-28 DIAGNOSIS — J439 Emphysema, unspecified: Secondary | ICD-10-CM | POA: Insufficient documentation

## 2014-09-28 DIAGNOSIS — Z9889 Other specified postprocedural states: Secondary | ICD-10-CM | POA: Diagnosis not present

## 2014-09-28 MED ORDER — IOHEXOL 300 MG/ML  SOLN
75.0000 mL | Freq: Once | INTRAMUSCULAR | Status: AC | PRN
  Administered 2014-09-28: 75 mL via INTRAVENOUS

## 2014-09-28 MED ORDER — OXYCODONE HCL 5 MG PO TABS: 5.0000 mg | ORAL_TABLET | Freq: Four times a day (QID) | ORAL | Status: DC | PRN

## 2014-09-28 NOTE — Telephone Encounter (Signed)
RX refill for oxycodone 5 mg given and left at front desk for pick up by pt's wife.

## 2014-10-01 ENCOUNTER — Ambulatory Visit (HOSPITAL_BASED_OUTPATIENT_CLINIC_OR_DEPARTMENT_OTHER): Payer: 59

## 2014-10-01 ENCOUNTER — Encounter: Payer: Self-pay | Admitting: Internal Medicine

## 2014-10-01 ENCOUNTER — Ambulatory Visit (HOSPITAL_BASED_OUTPATIENT_CLINIC_OR_DEPARTMENT_OTHER): Payer: 59 | Admitting: Internal Medicine

## 2014-10-01 DIAGNOSIS — C3412 Malignant neoplasm of upper lobe, left bronchus or lung: Secondary | ICD-10-CM

## 2014-10-01 DIAGNOSIS — Z720 Tobacco use: Secondary | ICD-10-CM | POA: Diagnosis not present

## 2014-10-01 DIAGNOSIS — C3492 Malignant neoplasm of unspecified part of left bronchus or lung: Secondary | ICD-10-CM

## 2014-10-01 DIAGNOSIS — J449 Chronic obstructive pulmonary disease, unspecified: Secondary | ICD-10-CM

## 2014-10-01 LAB — COMPREHENSIVE METABOLIC PANEL (CC13)
ALBUMIN: 3.8 g/dL (ref 3.5–5.0)
ALT: 28 U/L (ref 0–55)
AST: 26 U/L (ref 5–34)
Alkaline Phosphatase: 98 U/L (ref 40–150)
Anion Gap: 7 mEq/L (ref 3–11)
BUN: 8.9 mg/dL (ref 7.0–26.0)
CALCIUM: 9.3 mg/dL (ref 8.4–10.4)
CHLORIDE: 102 meq/L (ref 98–109)
CO2: 28 meq/L (ref 22–29)
CREATININE: 0.9 mg/dL (ref 0.7–1.3)
GLUCOSE: 147 mg/dL — AB (ref 70–140)
Potassium: 4.1 mEq/L (ref 3.5–5.1)
Sodium: 137 mEq/L (ref 136–145)
TOTAL PROTEIN: 7.5 g/dL (ref 6.4–8.3)

## 2014-10-01 LAB — CBC WITH DIFFERENTIAL/PLATELET
BASO%: 0.7 % (ref 0.0–2.0)
BASOS ABS: 0 10*3/uL (ref 0.0–0.1)
EOS ABS: 0.3 10*3/uL (ref 0.0–0.5)
EOS%: 4.9 % (ref 0.0–7.0)
HEMATOCRIT: 36.6 % — AB (ref 38.4–49.9)
HGB: 12.3 g/dL — ABNORMAL LOW (ref 13.0–17.1)
LYMPH%: 16.5 % (ref 14.0–49.0)
MCH: 30.3 pg (ref 27.2–33.4)
MCHC: 33.6 g/dL (ref 32.0–36.0)
MCV: 90.1 fL (ref 79.3–98.0)
MONO#: 0.8 10*3/uL (ref 0.1–0.9)
MONO%: 11.6 % (ref 0.0–14.0)
NEUT#: 4.6 10*3/uL (ref 1.5–6.5)
NEUT%: 66.3 % (ref 39.0–75.0)
Platelets: 408 10*3/uL — ABNORMAL HIGH (ref 140–400)
RBC: 4.06 10*6/uL — ABNORMAL LOW (ref 4.20–5.82)
RDW: 15.4 % — AB (ref 11.0–14.6)
WBC: 7 10*3/uL (ref 4.0–10.3)
lymph#: 1.2 10*3/uL (ref 0.9–3.3)

## 2014-10-01 NOTE — Addendum Note (Signed)
Addended by: Lucile Crater on: 10/01/2014 11:39 AM   Modules accepted: Medications
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
## 2014-10-02 ENCOUNTER — Encounter: Payer: Self-pay | Admitting: Pulmonary Disease

## 2014-10-02 ENCOUNTER — Telehealth: Payer: Self-pay | Admitting: Internal Medicine

## 2014-10-02 ENCOUNTER — Other Ambulatory Visit: Payer: Self-pay | Admitting: Pulmonary Disease

## 2014-10-02 ENCOUNTER — Ambulatory Visit: Payer: 59 | Admitting: Pulmonary Disease

## 2014-10-02 VITALS — BP 136/78 | HR 84 | Temp 97.9°F | Wt 232.2 lb

## 2014-10-02 DIAGNOSIS — Z23 Encounter for immunization: Secondary | ICD-10-CM

## 2014-10-02 DIAGNOSIS — C3492 Malignant neoplasm of unspecified part of left bronchus or lung: Secondary | ICD-10-CM | POA: Diagnosis not present

## 2014-10-02 DIAGNOSIS — G4733 Obstructive sleep apnea (adult) (pediatric): Secondary | ICD-10-CM

## 2014-10-02 DIAGNOSIS — J449 Chronic obstructive pulmonary disease, unspecified: Secondary | ICD-10-CM

## 2014-10-02 DIAGNOSIS — J4489 Other specified chronic obstructive pulmonary disease: Secondary | ICD-10-CM

## 2014-10-02 MED ORDER — ALPRAZOLAM 0.25 MG PO TABS: ORAL_TABLET | ORAL | Status: DC

## 2014-10-02 MED ORDER — TIOTROPIUM BROMIDE MONOHYDRATE 18 MCG IN CAPS: ORAL_CAPSULE | RESPIRATORY_TRACT | Status: DC

## 2014-10-02 MED ORDER — MOMETASONE FURO-FORMOTEROL FUM 200-5 MCG/ACT IN AERO: 2.0000 | INHALATION_SPRAY | Freq: Two times a day (BID) | RESPIRATORY_TRACT | Status: DC

## 2014-10-02 NOTE — Patient Instructions (Addendum)
Vincent Sutton-- it's great seeing you again & congrats on being cancer free & not smoking!  Today we rechecked your breathing test post surgery...  Please re-start the DULERA200- 2 sprays twice daily...  Continue the [facility] daily...  Continue the PROAIR-HFA rescue inhaler as needed...  Call for any questions...  We gave you the 1st pneumonia vaccine > PREVNAR-13 today.  Be sure to get the Flu shot this fall...  Let's plan a follow up visit in 75mo sooner if needed for problems..Marland KitchenMarland Kitchen
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
## 2014-10-02 NOTE — Telephone Encounter (Signed)
Spoke with patient and he is aware of his appointments

## 2014-10-12 ENCOUNTER — Other Ambulatory Visit: Payer: Self-pay | Admitting: Pulmonary Disease

## 2014-10-12 MED ORDER — FLUTICASONE-SALMETEROL 230-21 MCG/ACT IN AERO: 2.0000 | INHALATION_SPRAY | Freq: Two times a day (BID) | RESPIRATORY_TRACT | Status: DC

## 2014-10-22 ENCOUNTER — Other Ambulatory Visit: Payer: Self-pay | Admitting: *Deleted

## 2014-10-22 DIAGNOSIS — C3492 Malignant neoplasm of unspecified part of left bronchus or lung: Secondary | ICD-10-CM

## 2014-10-22 DIAGNOSIS — G8918 Other acute postprocedural pain: Secondary | ICD-10-CM

## 2014-10-22 DIAGNOSIS — Z902 Acquired absence of lung [part of]: Secondary | ICD-10-CM

## 2014-10-22 MED ORDER — OXYCODONE HCL 5 MG PO TABS: 5.0000 mg | ORAL_TABLET | Freq: Four times a day (QID) | ORAL | Status: DC | PRN

## 2014-10-22 MED ORDER — TRAMADOL HCL 50 MG PO TABS: 50.0000 mg | ORAL_TABLET | Freq: Four times a day (QID) | ORAL | Status: DC | PRN

## 2014-10-22 NOTE — Telephone Encounter (Signed)
Vincent Sutton had a VATS lobectomy 07/24/14.  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 She is requesting a refill for his pain med.  I suggested Vicodin instead of Oxycodone since he was 3 months post op, but she said that causes nausea.  I added this med to his allergies.  I suggested Tramadol that I could fax to their pharmacy and she agreed.  I told her that it would be done tomorrow when I had a physian in the office and she understood.

## 2014-10-22 NOTE — Progress Notes (Signed)
Since Tramadol may cause nausea was noted by med alert, Oxycodone will be prescribed again for post op pain.  I discussed with the wife that this should be his last refill and he should try NSAIDS also.  She agreed.

## 2014-10-23 ENCOUNTER — Other Ambulatory Visit: Payer: Self-pay | Admitting: *Deleted

## 2014-10-23 DIAGNOSIS — C3492 Malignant neoplasm of unspecified part of left bronchus or lung: Secondary | ICD-10-CM

## 2014-10-23 DIAGNOSIS — Z902 Acquired absence of lung [part of]: Secondary | ICD-10-CM

## 2014-10-23 DIAGNOSIS — G8918 Other acute postprocedural pain: Secondary | ICD-10-CM

## 2014-10-23 MED ORDER — OXYCODONE HCL 5 MG PO TABS: 5.0000 mg | ORAL_TABLET | Freq: Four times a day (QID) | ORAL | Status: DC | PRN

## 2014-10-27 ENCOUNTER — Ambulatory Visit: Payer: 59 | Admitting: Thoracic Surgery (Cardiothoracic Vascular Surgery)

## 2014-11-02 ENCOUNTER — Other Ambulatory Visit: Payer: Self-pay | Admitting: Thoracic Surgery (Cardiothoracic Vascular Surgery)

## 2014-11-02 DIAGNOSIS — C3490 Malignant neoplasm of unspecified part of unspecified bronchus or lung: Secondary | ICD-10-CM

## 2014-11-03 ENCOUNTER — Ambulatory Visit (INDEPENDENT_AMBULATORY_CARE_PROVIDER_SITE_OTHER): Payer: Self-pay | Admitting: Thoracic Surgery (Cardiothoracic Vascular Surgery)

## 2014-11-03 ENCOUNTER — Encounter: Payer: Self-pay | Admitting: Thoracic Surgery (Cardiothoracic Vascular Surgery)

## 2014-11-03 ENCOUNTER — Ambulatory Visit
Admission: RE | Admit: 2014-11-03 | Discharge: 2014-11-03 | Disposition: A | Discharge status: Home / Self Care | Payer: 59 | Source: Ambulatory Visit | Attending: Thoracic Surgery (Cardiothoracic Vascular Surgery) | Admitting: Thoracic Surgery (Cardiothoracic Vascular Surgery)

## 2014-11-03 VITALS — BP 133/83 | HR 79 | Temp 98.0°F | Resp 20 | Ht 68.0 in | Wt 232.0 lb

## 2014-11-03 DIAGNOSIS — C3492 Malignant neoplasm of unspecified part of left bronchus or lung: Secondary | ICD-10-CM

## 2014-11-03 DIAGNOSIS — C3490 Malignant neoplasm of unspecified part of unspecified bronchus or lung: Secondary | ICD-10-CM

## 2014-11-03 DIAGNOSIS — Z9889 Other specified postprocedural states: Secondary | ICD-10-CM

## 2014-11-03 DIAGNOSIS — C3412 Malignant neoplasm of upper lobe, left bronchus or lung: Secondary | ICD-10-CM

## 2014-11-03 DIAGNOSIS — Z902 Acquired absence of lung [part of]: Secondary | ICD-10-CM

## 2014-11-03 MED ORDER — LEVOFLOXACIN 750 MG PO TABS: 750.0000 mg | ORAL_TABLET | Freq: Every day | ORAL | Status: DC
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
## 2014-11-11 ENCOUNTER — Other Ambulatory Visit: Payer: Self-pay | Admitting: Pulmonary Disease

## 2014-11-27 ENCOUNTER — Other Ambulatory Visit: Payer: Self-pay | Admitting: Thoracic Surgery (Cardiothoracic Vascular Surgery)

## 2014-11-27 DIAGNOSIS — C3490 Malignant neoplasm of unspecified part of unspecified bronchus or lung: Secondary | ICD-10-CM

## 2014-12-01 ENCOUNTER — Ambulatory Visit: Payer: 59 | Admitting: Thoracic Surgery (Cardiothoracic Vascular Surgery)

## 2014-12-12 ENCOUNTER — Other Ambulatory Visit: Payer: Self-pay | Admitting: Pulmonary Disease

## 2014-12-15 ENCOUNTER — Ambulatory Visit: Payer: 59 | Admitting: Thoracic Surgery (Cardiothoracic Vascular Surgery)

## 2014-12-22 ENCOUNTER — Ambulatory Visit (INDEPENDENT_AMBULATORY_CARE_PROVIDER_SITE_OTHER): Payer: 59 | Admitting: Thoracic Surgery (Cardiothoracic Vascular Surgery)

## 2014-12-22 ENCOUNTER — Ambulatory Visit
Admission: RE | Admit: 2014-12-22 | Discharge: 2014-12-22 | Disposition: A | Discharge status: Home / Self Care | Payer: 59 | Source: Ambulatory Visit | Attending: Thoracic Surgery (Cardiothoracic Vascular Surgery) | Admitting: Thoracic Surgery (Cardiothoracic Vascular Surgery)

## 2014-12-22 ENCOUNTER — Encounter: Payer: Self-pay | Admitting: Thoracic Surgery (Cardiothoracic Vascular Surgery)

## 2014-12-22 VITALS — BP 129/78 | HR 81 | Resp 18 | Ht 68.0 in | Wt 230.0 lb

## 2014-12-22 DIAGNOSIS — C3492 Malignant neoplasm of unspecified part of left bronchus or lung: Secondary | ICD-10-CM | POA: Diagnosis not present

## 2014-12-22 DIAGNOSIS — Z902 Acquired absence of lung [part of]: Secondary | ICD-10-CM

## 2014-12-22 DIAGNOSIS — C3490 Malignant neoplasm of unspecified part of unspecified bronchus or lung: Secondary | ICD-10-CM
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
## 2015-01-04 ENCOUNTER — Ambulatory Visit: Payer: No Typology Code available for payment source | Admitting: Pulmonary Disease

## 2015-01-10 ENCOUNTER — Encounter (HOSPITAL_COMMUNITY): Payer: Self-pay | Admitting: Emergency Medicine

## 2015-01-10 ENCOUNTER — Emergency Department (HOSPITAL_COMMUNITY)
Admission: EM | Admit: 2015-01-10 | Discharge: 2015-01-10 | Disposition: A | Discharge status: Home / Self Care | Payer: 59 | Source: Home / Self Care | Attending: Emergency Medicine | Admitting: Emergency Medicine

## 2015-01-10 ENCOUNTER — Emergency Department (INDEPENDENT_AMBULATORY_CARE_PROVIDER_SITE_OTHER): Payer: 59

## 2015-01-10 DIAGNOSIS — J40 Bronchitis, not specified as acute or chronic: Secondary | ICD-10-CM

## 2015-01-10 MED ORDER — IPRATROPIUM-ALBUTEROL 0.5-2.5 (3) MG/3ML IN SOLN
RESPIRATORY_TRACT | Status: AC
  Filled 2015-01-10: qty 3

## 2015-01-10 MED ORDER — PREDNISONE 50 MG PO TABS: ORAL_TABLET | ORAL | Status: DC

## 2015-01-10 MED ORDER — IPRATROPIUM-ALBUTEROL 0.5-2.5 (3) MG/3ML IN SOLN
3.0000 mL | Freq: Once | RESPIRATORY_TRACT | Status: AC
Start: 2015-01-10 — End: 2015-01-10
  Administered 2015-01-10: 3 mL via RESPIRATORY_TRACT
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
## 2015-01-10 NOTE — Discharge Instructions (Signed)
Take Prednisone 50 mg daily for 5 days for acute bronchitis. Increase Ventolin inhaler use to four times daily (every 4 hours). Follow up with Pulmonologist, Dr. Lenna Gilford if symptoms are not improving.   Acute Bronchitis Bronchitis is inflammation of the airways that extend from the windpipe into the lungs (bronchi). The inflammation often causes mucus to develop. This leads to a cough, which is the most common symptom of bronchitis.  In acute bronchitis, the condition usually develops suddenly and goes away over time, usually in a couple weeks. Smoking, allergies, and asthma can make bronchitis worse. Repeated episodes of bronchitis may cause further lung problems.  CAUSES Acute bronchitis is most often caused by the same virus that causes a cold. The virus can spread from person to person (contagious) through coughing, sneezing, and touching contaminated objects. SIGNS AND SYMPTOMS   Cough.   Fever.   Coughing up mucus.   Body aches.   Chest congestion.   Chills.   Shortness of breath.   Sore throat.  DIAGNOSIS  Acute bronchitis is usually diagnosed through a physical exam. Your health care provider will also ask you questions about your medical history. Tests, such as chest X-rays, are sometimes done to rule out other conditions.  TREATMENT  Acute bronchitis usually goes away in a couple weeks. Oftentimes, no medical treatment is necessary. Medicines are sometimes given for relief of fever or cough. Antibiotic medicines are usually not needed but may be prescribed in certain situations. In some cases, an inhaler may be recommended to help reduce shortness of breath and control the cough. A cool mist vaporizer may also be used to help thin bronchial secretions and make it easier to clear the chest.  HOME CARE INSTRUCTIONS  Get plenty of rest.   Drink enough fluids to keep your urine clear or pale yellow (unless you have a medical condition that requires fluid restriction).  Increasing fluids may help thin your respiratory secretions (sputum) and reduce chest congestion, and it will prevent dehydration.   Take medicines only as directed by your health care provider.  If you were prescribed an antibiotic medicine, finish it all even if you start to feel better.  Avoid smoking and secondhand smoke. Exposure to cigarette smoke or irritating chemicals will make bronchitis worse. If you are a smoker, consider using nicotine gum or skin patches to help control withdrawal symptoms. Quitting smoking will help your lungs heal faster.   Reduce the chances of another bout of acute bronchitis by washing your hands frequently, avoiding people with cold symptoms, and trying not to touch your hands to your mouth, nose, or eyes.   Keep all follow-up visits as directed by your health care provider.  SEEK MEDICAL CARE IF: Your symptoms do not improve after 1 week of treatment.  SEEK IMMEDIATE MEDICAL CARE IF:  You develop an increased fever or chills.   You have chest pain.   You have severe shortness of breath.  You have bloody sputum.   You develop dehydration.  You faint or repeatedly feel like you are going to pass out.  You develop repeated vomiting.  You develop a severe headache. MAKE SURE YOU:   Understand these instructions.  Will watch your condition.  Will get help right away if you are not doing well or get worse.   This information is not intended to replace advice given to you by your health care provider. Make sure you discuss any questions you have with your health care provider.  Document Released: 03/16/2004 Document Revised: 02/27/2014 Document Reviewed: 07/30/2012 Elsevier Interactive Patient Education [employer].

## 2015-01-10 NOTE — ED Notes (Signed)
C/o cough.  Patient diagnosed and had lung surgery in June 2016 for lung cancer.  Patient has completed radiation and chemo.  One month ago had pneumonia.  Saw dr hendrickson 1-2 weeks ago and patient told chest film "a little cloudy".  Patient reports choking on nuts Friday 11/17.  Since then has been coughing, wheezing and increased sob.  -patient had yellow/green phlegm this morning and it has cleared throughout the day.

## 2015-01-11 ENCOUNTER — Other Ambulatory Visit: Payer: Self-pay | Admitting: Pulmonary Disease

## 2015-01-16 ENCOUNTER — Encounter (HOSPITAL_COMMUNITY): Payer: Self-pay | Admitting: Emergency Medicine

## 2015-01-16 ENCOUNTER — Emergency Department (INDEPENDENT_AMBULATORY_CARE_PROVIDER_SITE_OTHER)
Admission: EM | Admit: 2015-01-16 | Discharge: 2015-01-16 | Disposition: A | Discharge status: Home / Self Care | Payer: 59 | Source: Home / Self Care | Attending: Emergency Medicine | Admitting: Emergency Medicine

## 2015-01-16 DIAGNOSIS — Z85118 Personal history of other malignant neoplasm of bronchus and lung: Secondary | ICD-10-CM

## 2015-01-16 DIAGNOSIS — R0600 Dyspnea, unspecified: Secondary | ICD-10-CM | POA: Diagnosis not present

## 2015-01-16 DIAGNOSIS — J449 Chronic obstructive pulmonary disease, unspecified: Secondary | ICD-10-CM

## 2015-01-16 DIAGNOSIS — J441 Chronic obstructive pulmonary disease with (acute) exacerbation: Secondary | ICD-10-CM

## 2015-01-16 MED ORDER — IPRATROPIUM-ALBUTEROL 0.5-2.5 (3) MG/3ML IN SOLN
RESPIRATORY_TRACT | Status: AC
  Filled 2015-01-16: qty 3

## 2015-01-16 MED ORDER — ALBUTEROL SULFATE (2.5 MG/3ML) 0.083% IN NEBU
INHALATION_SOLUTION | RESPIRATORY_TRACT | Status: AC
  Filled 2015-01-16: qty 3

## 2015-01-16 MED ORDER — DEXAMETHASONE SODIUM PHOSPHATE 10 MG/ML IJ SOLN
10.0000 mg | Freq: Once | INTRAMUSCULAR | Status: AC
  Administered 2015-01-16: 10 mg via INTRAMUSCULAR

## 2015-01-16 MED ORDER — ALBUTEROL SULFATE (2.5 MG/3ML) 0.083% IN NEBU
2.5000 mg | INHALATION_SOLUTION | Freq: Once | RESPIRATORY_TRACT | Status: AC
  Administered 2015-01-16: 2.5 mg via RESPIRATORY_TRACT

## 2015-01-16 MED ORDER — DEXAMETHASONE SODIUM PHOSPHATE 10 MG/ML IJ SOLN
INTRAMUSCULAR | Status: AC
  Filled 2015-01-16: qty 1

## 2015-01-16 MED ORDER — ALBUTEROL SULFATE (2.5 MG/3ML) 0.083% IN NEBU: 2.5000 mg | INHALATION_SOLUTION | Freq: Four times a day (QID) | RESPIRATORY_TRACT | Status: DC | PRN

## 2015-01-16 MED ORDER — PREDNISONE 20 MG PO TABS: ORAL_TABLET | ORAL | Status: DC

## 2015-01-16 MED ORDER — IPRATROPIUM-ALBUTEROL 0.5-2.5 (3) MG/3ML IN SOLN
3.0000 mL | Freq: Once | RESPIRATORY_TRACT | Status: AC
  Administered 2015-01-16: 3 mL via RESPIRATORY_TRACT
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
## 2015-01-16 NOTE — Discharge Instructions (Signed)

## 2015-01-16 NOTE — ED Notes (Signed)
C/o SOB onset x1 week associated w/wheezing, dyspnea and a prod cough Seen here on 11/20; given steroids w/temp relief Hx of lung surg in 07/2014 A&O x4... No acute distress.

## 2015-01-29 ENCOUNTER — Other Ambulatory Visit: Payer: 59

## 2015-01-29 ENCOUNTER — Ambulatory Visit (HOSPITAL_COMMUNITY): Payer: 59

## 2015-01-29 ENCOUNTER — Other Ambulatory Visit (HOSPITAL_BASED_OUTPATIENT_CLINIC_OR_DEPARTMENT_OTHER): Payer: 59

## 2015-01-29 ENCOUNTER — Telehealth: Payer: Self-pay | Admitting: Pulmonary Disease

## 2015-01-29 ENCOUNTER — Encounter (HOSPITAL_COMMUNITY): Payer: Self-pay

## 2015-01-29 ENCOUNTER — Ambulatory Visit (HOSPITAL_COMMUNITY)
Admission: RE | Admit: 2015-01-29 | Discharge: 2015-01-29 | Disposition: A | Discharge status: Home / Self Care | Payer: 59 | Source: Ambulatory Visit | Attending: Internal Medicine | Admitting: Internal Medicine

## 2015-01-29 ENCOUNTER — Telehealth: Payer: Self-pay | Admitting: Internal Medicine

## 2015-01-29 DIAGNOSIS — C3492 Malignant neoplasm of unspecified part of left bronchus or lung: Secondary | ICD-10-CM

## 2015-01-29 DIAGNOSIS — J449 Chronic obstructive pulmonary disease, unspecified: Secondary | ICD-10-CM | POA: Insufficient documentation

## 2015-01-29 DIAGNOSIS — Z902 Acquired absence of lung [part of]: Secondary | ICD-10-CM | POA: Diagnosis not present

## 2015-01-29 LAB — CBC WITH DIFFERENTIAL/PLATELET
BASO%: 1.4 % (ref 0.0–2.0)
BASOS ABS: 0.1 10*3/uL (ref 0.0–0.1)
EOS ABS: 0.2 10*3/uL (ref 0.0–0.5)
EOS%: 2 % (ref 0.0–7.0)
HEMATOCRIT: 42.8 % (ref 38.4–49.9)
HEMOGLOBIN: 14.4 g/dL (ref 13.0–17.1)
LYMPH#: 1.6 10*3/uL (ref 0.9–3.3)
LYMPH%: 16.4 % (ref 14.0–49.0)
MCH: 30.4 pg (ref 27.2–33.4)
MCHC: 33.7 g/dL (ref 32.0–36.0)
MCV: 90.3 fL (ref 79.3–98.0)
MONO#: 1.1 10*3/uL — AB (ref 0.1–0.9)
MONO%: 11.3 % (ref 0.0–14.0)
NEUT#: 6.6 10*3/uL — ABNORMAL HIGH (ref 1.5–6.5)
NEUT%: 68.9 % (ref 39.0–75.0)
Platelets: 351 10*3/uL (ref 140–400)
RBC: 4.74 10*6/uL (ref 4.20–5.82)
RDW: 15.7 % — AB (ref 11.0–14.6)
WBC: 9.6 10*3/uL (ref 4.0–10.3)

## 2015-01-29 LAB — COMPREHENSIVE METABOLIC PANEL
ALBUMIN: 3.8 g/dL (ref 3.5–5.0)
ALK PHOS: 82 U/L (ref 40–150)
ALT: 44 U/L (ref 0–55)
AST: 23 U/L (ref 5–34)
Anion Gap: 9 mEq/L (ref 3–11)
BILIRUBIN TOTAL: 0.56 mg/dL (ref 0.20–1.20)
BUN: 14.3 mg/dL (ref 7.0–26.0)
CALCIUM: 9.4 mg/dL (ref 8.4–10.4)
CO2: 27 mEq/L (ref 22–29)
Chloride: 102 mEq/L (ref 98–109)
Creatinine: 0.9 mg/dL (ref 0.7–1.3)
Glucose: 123 mg/dl (ref 70–140)
POTASSIUM: 4.1 meq/L (ref 3.5–5.1)
Sodium: 138 mEq/L (ref 136–145)
TOTAL PROTEIN: 7.4 g/dL (ref 6.4–8.3)

## 2015-01-29 MED ORDER — ALPRAZOLAM 0.25 MG PO TABS: 0.2500 mg | ORAL_TABLET | Freq: Every evening | ORAL | Status: DC | PRN

## 2015-01-29 MED ORDER — IOHEXOL 300 MG/ML  SOLN
75.0000 mL | Freq: Once | INTRAMUSCULAR | Status: AC | PRN
  Administered 2015-01-29: 75 mL via INTRAVENOUS

## 2015-01-29 NOTE — Telephone Encounter (Signed)
Spoke with pt - aware that Rx has been called in to [facility] Nothing further needed.

## 2015-01-29 NOTE — Telephone Encounter (Signed)
Spoke with pt, requesting refill on xanax. Last refill 10/02/14 #90 with 0 refills, xanax 0.'25mg'$ - 1 po qhs prn.   Last office visit 10/02/14 Next office visit 04/12/2015 Pt uses [facility] on [address]   SN please advise on refill.  Thanks!

## 2015-01-29 NOTE — Telephone Encounter (Signed)
per pof to sch pt appt-gave pt copy of avs-Central sch put CT back on sch-sent pt to have sts labs and then to Radiolgy

## 2015-02-02 ENCOUNTER — Encounter: Payer: Self-pay | Admitting: *Deleted

## 2015-02-02 ENCOUNTER — Ambulatory Visit (HOSPITAL_BASED_OUTPATIENT_CLINIC_OR_DEPARTMENT_OTHER): Payer: 59 | Admitting: Internal Medicine

## 2015-02-02 ENCOUNTER — Encounter: Payer: Self-pay | Admitting: Internal Medicine

## 2015-02-02 VITALS — BP 142/82 | HR 107 | Temp 98.3°F | Resp 18 | Ht 68.0 in | Wt 242.7 lb

## 2015-02-02 DIAGNOSIS — C3412 Malignant neoplasm of upper lobe, left bronchus or lung: Secondary | ICD-10-CM | POA: Diagnosis not present

## 2015-02-02 DIAGNOSIS — C3492 Malignant neoplasm of unspecified part of left bronchus or lung: Secondary | ICD-10-CM

## 2015-02-02 NOTE — Progress Notes (Signed)
Oncology Nurse Navigator Documentation  Oncology Nurse Navigator Flowsheets 02/02/2015  Navigator Encounter Type 6 month/I spoke with patient and wife today.  He is doing well.  Does have an upper resp infection and was tx. Patient was update on recent scans and has a follow up in 4 months.  Vincent Sutton seemed please about the news.  No barriers identified.   Patient Visit Type Follow-up  Treatment Phase Other  Barriers/Navigation Needs No barriers at this time  Time Spent with Patient 15

## 2015-02-02 NOTE — Addendum Note (Signed)
Addended by: Ardeen Garland on: 02/02/2015 03:44 PM   Modules accepted: Orders, Medications
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
## 2015-02-03 ENCOUNTER — Telehealth: Payer: Self-pay | Admitting: Internal Medicine

## 2015-02-03 NOTE — Telephone Encounter (Signed)
s.w. pt and advised on April 2017 appt....pt ok and aware

## 2015-02-13 ENCOUNTER — Other Ambulatory Visit: Payer: Self-pay | Admitting: Pulmonary Disease

## 2015-02-19 ENCOUNTER — Other Ambulatory Visit: Payer: Self-pay | Admitting: Thoracic Surgery (Cardiothoracic Vascular Surgery)

## 2015-02-19 DIAGNOSIS — C3490 Malignant neoplasm of unspecified part of unspecified bronchus or lung: Secondary | ICD-10-CM

## 2015-02-23 ENCOUNTER — Encounter: Payer: Self-pay | Admitting: Thoracic Surgery (Cardiothoracic Vascular Surgery)

## 2015-02-23 ENCOUNTER — Ambulatory Visit (INDEPENDENT_AMBULATORY_CARE_PROVIDER_SITE_OTHER): Payer: 59 | Admitting: Thoracic Surgery (Cardiothoracic Vascular Surgery)

## 2015-02-23 ENCOUNTER — Ambulatory Visit
Admission: RE | Admit: 2015-02-23 | Discharge: 2015-02-23 | Disposition: A | Discharge status: Home / Self Care | Payer: 59 | Source: Ambulatory Visit | Attending: Thoracic Surgery (Cardiothoracic Vascular Surgery) | Admitting: Thoracic Surgery (Cardiothoracic Vascular Surgery)

## 2015-02-23 VITALS — BP 139/80 | HR 90 | Resp 20 | Ht 68.0 in | Wt 240.0 lb

## 2015-02-23 DIAGNOSIS — Z902 Acquired absence of lung [part of]: Secondary | ICD-10-CM | POA: Diagnosis not present

## 2015-02-23 DIAGNOSIS — C3490 Malignant neoplasm of unspecified part of unspecified bronchus or lung: Secondary | ICD-10-CM

## 2015-02-23 DIAGNOSIS — C3492 Malignant neoplasm of unspecified part of left bronchus or lung: Secondary | ICD-10-CM

## 2015-02-23 MED ORDER — PREDNISONE 10 MG (21) PO TBPK: 10.0000 mg | ORAL_TABLET | Freq: Every day | ORAL | Status: DC
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
## 2015-04-09 ENCOUNTER — Ambulatory Visit: Payer: 59 | Admitting: Pulmonary Disease

## 2015-04-12 ENCOUNTER — Ambulatory Visit: Payer: 59 | Admitting: Pulmonary Disease

## 2015-04-14 ENCOUNTER — Telehealth: Payer: Self-pay | Admitting: Internal Medicine

## 2015-04-14 NOTE — Telephone Encounter (Signed)
Pt's wife requested entire records for [place].  Printed [phone] release id)

## 2015-04-21 ENCOUNTER — Ambulatory Visit: Payer: 59 | Admitting: Pulmonary Disease

## 2015-05-28 ENCOUNTER — Telehealth: Payer: Self-pay | Admitting: Radiology

## 2015-05-31 ENCOUNTER — Telehealth: Payer: Self-pay | Admitting: *Deleted

## 2015-05-31 ENCOUNTER — Telehealth: Payer: Self-pay | Admitting: Hematology

## 2015-05-31 NOTE — Telephone Encounter (Signed)
Oncology Nurse Navigator Documentation  Oncology Nurse Navigator Flowsheets 05/31/2015  Navigator Encounter Type Telephone  Telephone Outgoing Call  Patient Visit Type Follow-up  Barriers/Navigation Needs Coordination of Care  Interventions Coordination of Care  Coordination of Care Appts  Time Spent with Patient 15   Per Dr. Julien Nordmann, he wanted me to follow up with patient regarding where he is receiving tx.  Dr. Julien Nordmann states, he believes he is going to receive tx at [facility] but would like me to verify.  Vincent Sutton stated he is going to be seen this Thursday at the [facility] by Medical Oncologist.  Patient asked that I notify Dr. Leonarda Salon office of his plan.  I stated I would.  I also updated Dr. Julien Nordmann.

## 2015-05-31 NOTE — Telephone Encounter (Signed)
Returned call to patient re rescheduling appointment. Gave patient new appointment for 4/17 @ 1 pm. Called patient at [phone].

## 2015-06-01 ENCOUNTER — Other Ambulatory Visit: Payer: 59

## 2015-06-04 NOTE — Telephone Encounter (Signed)
Appointments for 4/17 cxd - scheduled in error. Left message for patient confirming he has no appointment in our offce. See also navigator's note.

## 2015-06-07 ENCOUNTER — Other Ambulatory Visit: Payer: 59

## 2015-06-07 ENCOUNTER — Ambulatory Visit: Payer: 59 | Admitting: Hematology

## 2015-06-08 ENCOUNTER — Ambulatory Visit: Payer: 59 | Admitting: Internal Medicine

## 2015-08-18 ENCOUNTER — Other Ambulatory Visit: Payer: Self-pay | Admitting: *Deleted

## 2015-08-18 DIAGNOSIS — C3492 Malignant neoplasm of unspecified part of left bronchus or lung: Secondary | ICD-10-CM

## 2015-09-07 ENCOUNTER — Ambulatory Visit: Payer: 59 | Admitting: Thoracic Surgery (Cardiothoracic Vascular Surgery)

## 2015-09-07 ENCOUNTER — Other Ambulatory Visit: Payer: 59

## 2015-11-27 ENCOUNTER — Encounter (HOSPITAL_COMMUNITY): Payer: Self-pay | Admitting: *Deleted

## 2015-11-27 ENCOUNTER — Emergency Department (HOSPITAL_COMMUNITY)
Admission: EM | Admit: 2015-11-27 | Discharge: 2015-11-27 | Disposition: A | Discharge status: Home / Self Care | Payer: Non-veteran care | Attending: Emergency Medicine | Admitting: Emergency Medicine

## 2015-11-27 ENCOUNTER — Emergency Department (HOSPITAL_COMMUNITY): Payer: Non-veteran care

## 2015-11-27 DIAGNOSIS — J441 Chronic obstructive pulmonary disease with (acute) exacerbation: Secondary | ICD-10-CM | POA: Diagnosis not present

## 2015-11-27 DIAGNOSIS — R0602 Shortness of breath: Secondary | ICD-10-CM | POA: Diagnosis present

## 2015-11-27 DIAGNOSIS — Z85118 Personal history of other malignant neoplasm of bronchus and lung: Secondary | ICD-10-CM | POA: Diagnosis not present

## 2015-11-27 DIAGNOSIS — Z87891 Personal history of nicotine dependence: Secondary | ICD-10-CM | POA: Insufficient documentation

## 2015-11-27 LAB — COMPREHENSIVE METABOLIC PANEL
ALT: 24 U/L (ref 17–63)
AST: 22 U/L (ref 15–41)
Albumin: 3.9 g/dL (ref 3.5–5.0)
Alkaline Phosphatase: 76 U/L (ref 38–126)
Anion gap: 9 (ref 5–15)
BUN: 15 mg/dL (ref 6–20)
CHLORIDE: 101 mmol/L (ref 101–111)
CO2: 30 mmol/L (ref 22–32)
Calcium: 9.6 mg/dL (ref 8.9–10.3)
Creatinine, Ser: 0.75 mg/dL (ref 0.61–1.24)
Glucose, Bld: 175 mg/dL — ABNORMAL HIGH (ref 65–99)
POTASSIUM: 3.9 mmol/L (ref 3.5–5.1)
SODIUM: 140 mmol/L (ref 135–145)
Total Bilirubin: <0.1 mg/dL — ABNORMAL LOW (ref 0.3–1.2)
Total Protein: 7.3 g/dL (ref 6.5–8.1)

## 2015-11-27 LAB — CBC WITH DIFFERENTIAL/PLATELET
BASOS ABS: 0 10*3/uL (ref 0.0–0.1)
Basophils Relative: 0 %
EOS ABS: 0 10*3/uL (ref 0.0–0.7)
EOS PCT: 0 %
HCT: 46.1 % (ref 39.0–52.0)
Hemoglobin: 14.7 g/dL (ref 13.0–17.0)
LYMPHS PCT: 9 %
Lymphs Abs: 0.8 10*3/uL (ref 0.7–4.0)
MCH: 31.3 pg (ref 26.0–34.0)
MCHC: 31.9 g/dL (ref 30.0–36.0)
MCV: 98.3 fL (ref 78.0–100.0)
MONO ABS: 0.7 10*3/uL (ref 0.1–1.0)
Monocytes Relative: 9 %
Neutro Abs: 7 10*3/uL (ref 1.7–7.7)
Neutrophils Relative %: 82 %
PLATELETS: 315 10*3/uL (ref 150–400)
RBC: 4.69 MIL/uL (ref 4.22–5.81)
RDW: 14.6 % (ref 11.5–15.5)
WBC: 8.6 10*3/uL (ref 4.0–10.5)

## 2015-11-27 LAB — BRAIN NATRIURETIC PEPTIDE: B NATRIURETIC PEPTIDE 5: 59.7 pg/mL (ref 0.0–100.0)

## 2015-11-27 MED ORDER — ALBUTEROL SULFATE (2.5 MG/3ML) 0.083% IN NEBU
2.5000 mg | INHALATION_SOLUTION | Freq: Once | RESPIRATORY_TRACT | Status: AC
  Administered 2015-11-27: 2.5 mg via RESPIRATORY_TRACT
  Filled 2015-11-27: qty 3

## 2015-11-27 MED ORDER — IPRATROPIUM BROMIDE 0.02 % IN SOLN
0.5000 mg | Freq: Once | RESPIRATORY_TRACT | Status: AC
  Administered 2015-11-27: 0.5 mg via RESPIRATORY_TRACT
  Filled 2015-11-27: qty 2.5

## 2015-11-27 MED ORDER — PREDNISONE 20 MG PO TABS: 40.0000 mg | ORAL_TABLET | Freq: Every day | ORAL | 0 refills | Status: DC

## 2015-11-27 MED ORDER — METHYLPREDNISOLONE SODIUM SUCC 125 MG IJ SOLR
125.0000 mg | Freq: Once | INTRAMUSCULAR | Status: AC
  Administered 2015-11-27: 125 mg via INTRAVENOUS
  Filled 2015-11-27: qty 2

## 2015-11-27 MED ORDER — ALBUTEROL SULFATE (2.5 MG/3ML) 0.083% IN NEBU
5.0000 mg | INHALATION_SOLUTION | Freq: Once | RESPIRATORY_TRACT | Status: AC
  Administered 2015-11-27: 5 mg via RESPIRATORY_TRACT
  Filled 2015-11-27: qty 6

## 2015-11-27 MED ORDER — OXYCODONE-ACETAMINOPHEN 5-325 MG PO TABS
1.0000 | ORAL_TABLET | Freq: Once | ORAL | Status: AC
  Administered 2015-11-27: 1 via ORAL
  Filled 2015-11-27: qty 1

## 2015-11-27 MED ORDER — ALBUTEROL (5 MG/ML) CONTINUOUS INHALATION SOLN
10.0000 mg/h | INHALATION_SOLUTION | RESPIRATORY_TRACT | Status: AC
  Administered 2015-11-27: 10 mg/h via RESPIRATORY_TRACT
  Filled 2015-11-27: qty 20

## 2015-11-27 NOTE — ED Notes (Signed)
Pt departed in NAD.  

## 2015-11-27 NOTE — ED Notes (Signed)
Ambulated patient in hallway patient maintain at 86% O2, and 95-98 pulse.. Patient stated that 74 without oxygen is good for him cause he is missing a half lung.Marland KitchenMarland Kitchen

## 2015-11-27 NOTE — ED Notes (Signed)
When he is on 02 he has 4 liters which he reports is not all the time his sat however  Are 99% on room air at present
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
## 2015-11-27 NOTE — ED Triage Notes (Signed)
The npt is c/o sob for 2-3 days he was in [place] working  When nit started.  He has copd  And he has had  A lung removed on the lt for  Non-small cell lung cancer June 2016.  He has home 02  But off the 02 in triage his sats were 99 %.  No pain anywhere

## 2016-03-11 ENCOUNTER — Other Ambulatory Visit: Payer: Self-pay | Admitting: Nurse Practitioner

## 2016-12-07 IMAGING — PT NM PET TUM IMG INITIAL (PI) SKULL BASE T - THIGH
8 series · 25 of 25 positions shown · non-contrast
Comparison: CT thorax 04/07/2014.

CLINICAL DATA: Initial Treatment strategy for left upper lobe
squamous cell carcinoma..

EXAM:
NUCLEAR MEDICINE PET SKULL BASE TO THIGH
TECHNIQUE: 10.7 mCi F-18 FDG was injected intravenously. Full-ring PET imaging
was performed from the skull base to thigh after the radiotracer. CT
data was obtained and used for attenuation correction and anatomic
localization.
FASTING BLOOD GLUCOSE:  Value: 106 mg/dl

[Series 3: pet sk_thigh ac · axial · 5.0mm · 4.07mm/px · z∈[-1034,-158]mm · 5 of 220 slices shown]
[im 1/220]
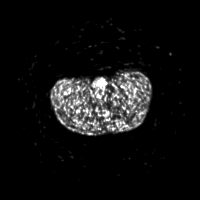
[im 55/220]
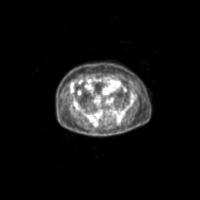
[im 110/220]
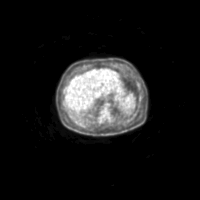
[im 165/220]
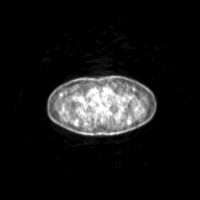
[im 220/220]
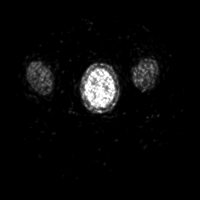

[Series 4: ct sk_thigh 5.0 b31f · axial · 5.0mm · 0.98mm/px · z∈[-1034,-158]mm · 5 of 220 slices shown]
[im 1/220]
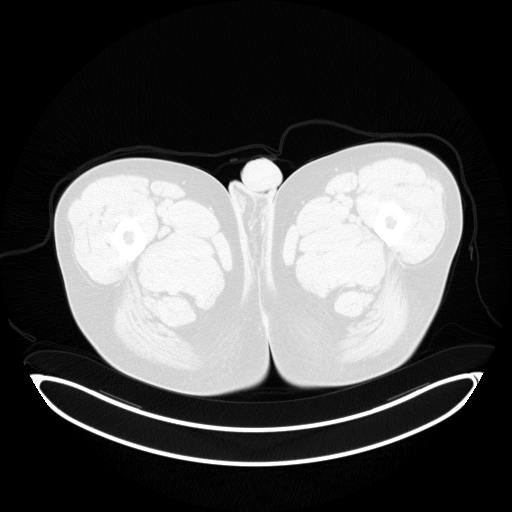
[im 55/220]
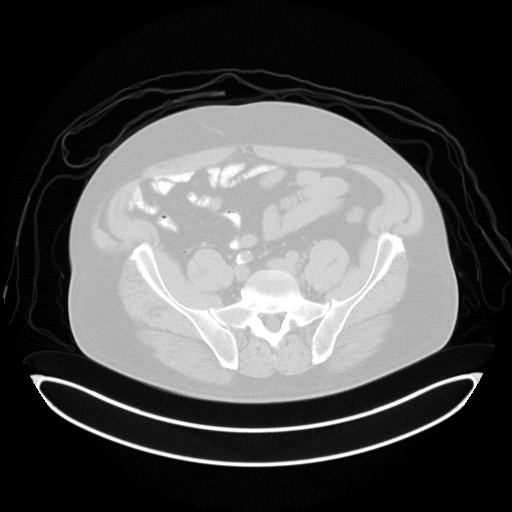
[im 110/220]
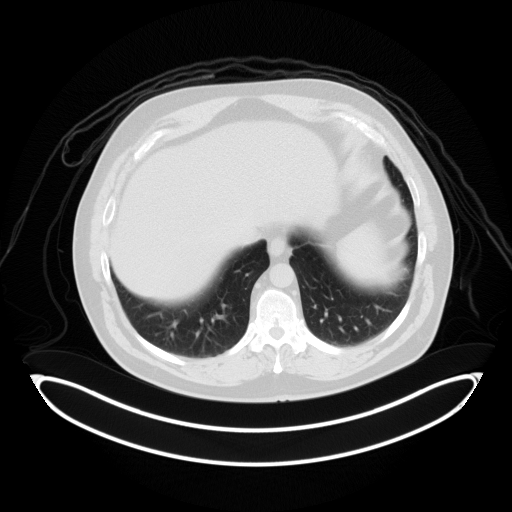
[im 165/220]
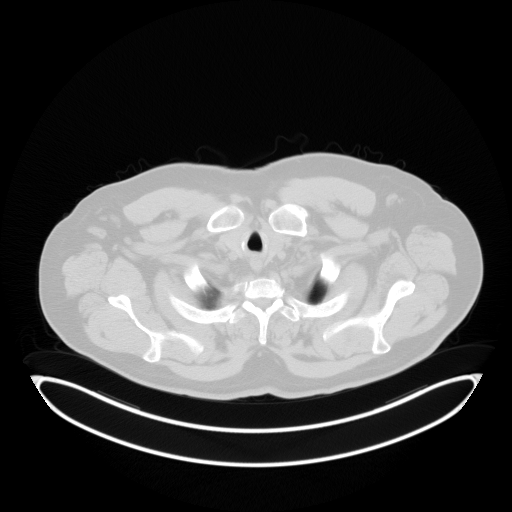
[im 220/220  brain]
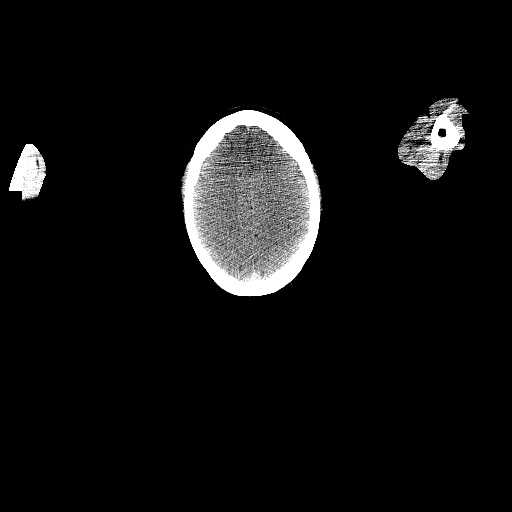

[Series 6: ct sk_thigh 5.0 b70f lung_bone · axial · 5.0mm · 0.65mm/px · 1 of 59 slices shown]
[im 1/59  bone]
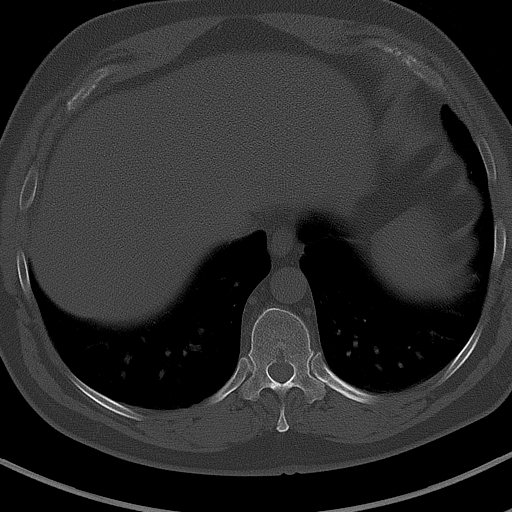

[Series 8: pet sk_thigh nac · axial · 5.0mm · 4.07mm/px · z∈[-1034,-158]mm · 5 of 220 slices shown]
[im 1/220]
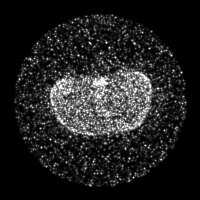
[im 55/220]
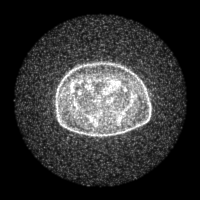
[im 110/220]
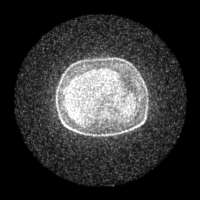
[im 165/220]
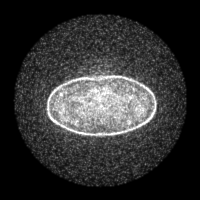
[im 220/220]
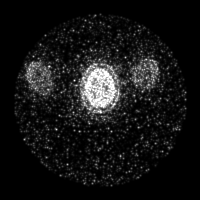

[Series 604: mip collection<mip range> · coronal · 1.82mm/px · 1 of 32 slices shown]
[im 1/32]
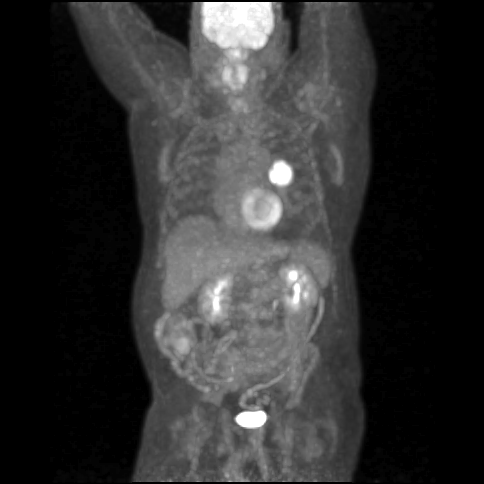

[Series 605: range-ct sk_thigh 5.0 (id)<alpha range> · 2 of 89 slices shown]
[im 1/89]
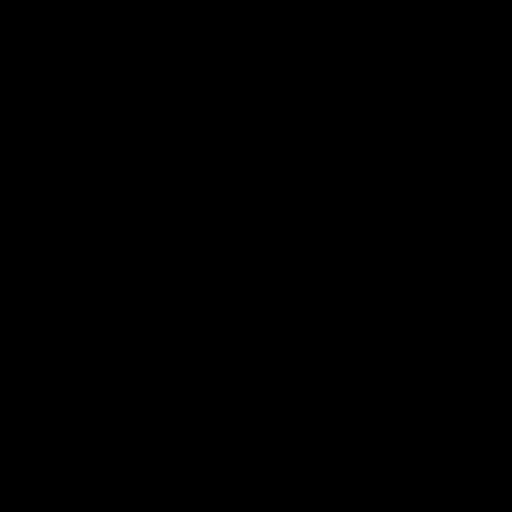
[im 89/89]
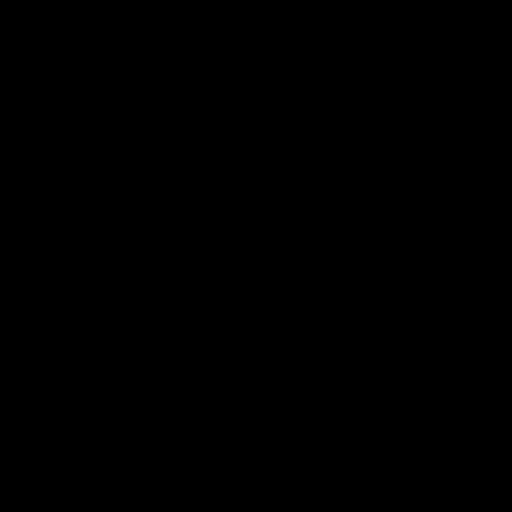

[Series 607: range-ct sk_thigh 5.0 (id)<alpha range(1)> · 5 of 211 slices shown]
[im 1/211]
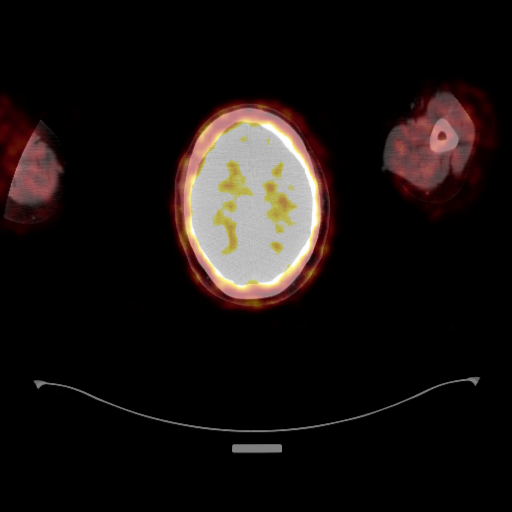
[im 53/211]
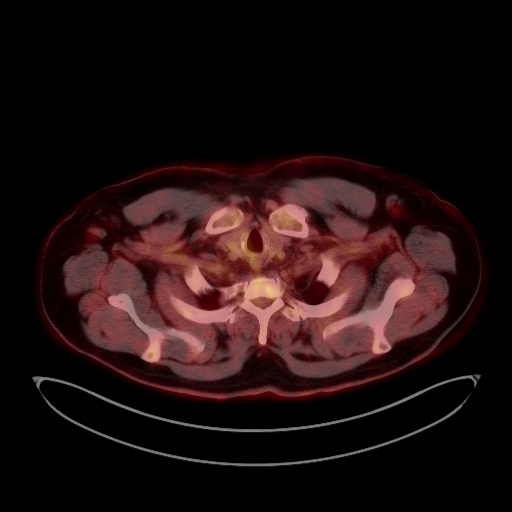
[im 106/211]
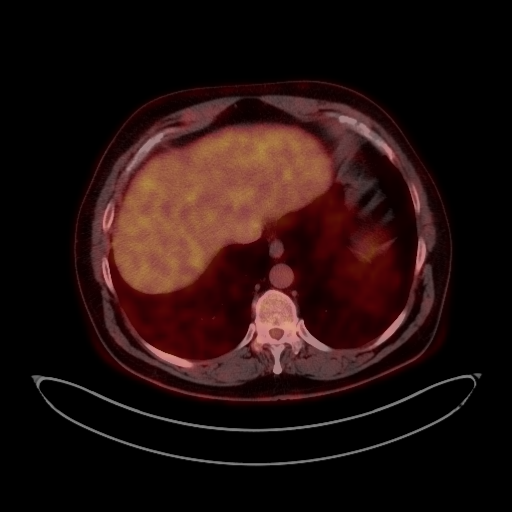
[im 158/211]
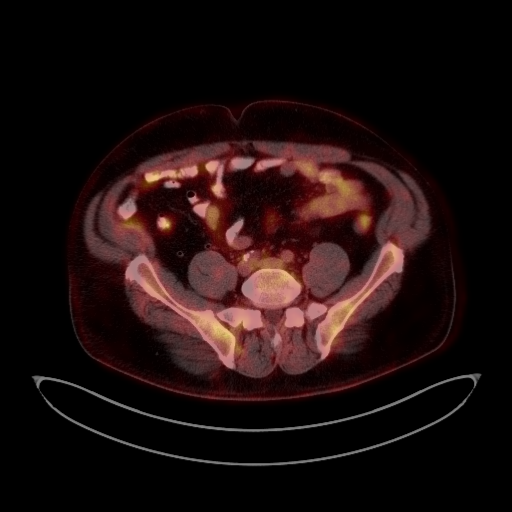
[im 211/211]
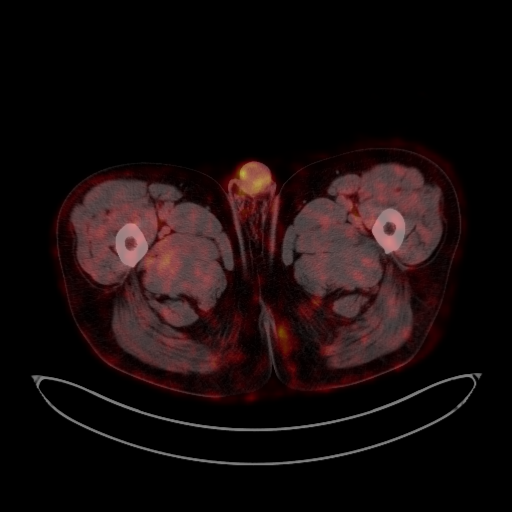

[Series 1032: results mm oncology reading · 0.89mm/px · 1 of 1 slices shown]
[im 1/1]
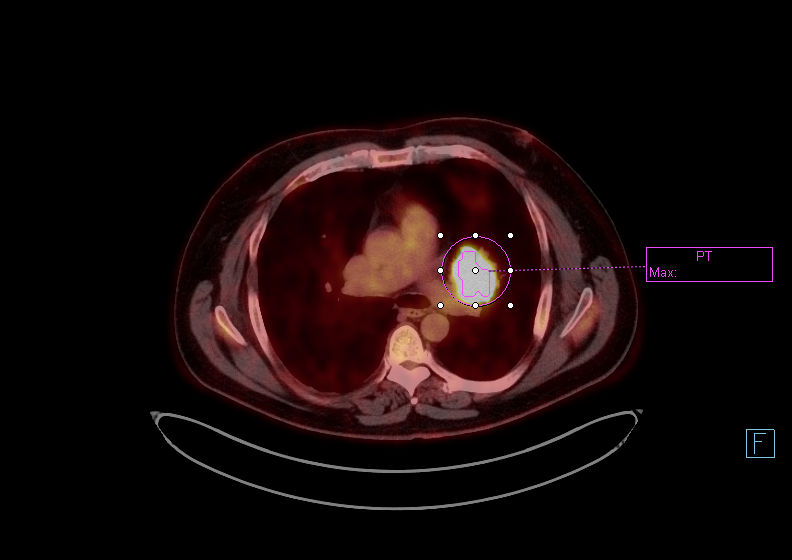

[25 of 25 positions shown; findings below may reference images not displayed]

FINDINGS: NECK

No hypermetabolic lymph nodes in the neck. There is symmetric
thickened hypermetabolic tissue in the piriform sinuses measuring 16
mm and 17 mm on left and right respectively (image 33, series 4).
Hypermetabolic tissue in the posterior posterior nasopharynx
measuring 23 mm thick. This tissue is fairly intense with SUV max
9.2.

CHEST

Hypermetabolic 5 cm mass in the left upper lobe just superior to the
left hilum. This left upper lobe mass is intensely hypermetabolic
with SUV max equal 23.7. There are no hypermetabolic mediastinal
lymph nodes.

There is extensive centrilobular emphysema. No additional
hypermetabolic nodules. There is mild metabolic activity associated
with pleural-parenchymal thickening in the right lower lobe of image
23 of lung series 6 which is felt to be reactive

ABDOMEN/PELVIS

No abnormal hypermetabolic activity within the liver, pancreas,
adrenal glands, or spleen. No hypermetabolic lymph nodes in the
abdomen or pelvis.

SKELETON

No focal hypermetabolic activity to suggest skeletal metastasis.
IMPRESSION: 1. Hypermetabolic left upper lobe mass superior to left hilum
consists with primary bronchogenic carcinoma.
2. No evidence of mediastinal nodal metastasis or distant
metastasis. Staging by FDG PET imaging T2b N0 M0
3. Symmetric thickened lymphoid tissue in the posterior nasopharynx
and the piriform sinuses. This is likely reactive/inflammatory
lymphoid tissue. Recommend clinical correlation and consider direct
visualization to exclude a lymphoproliferative process.

## 2017-08-30 ENCOUNTER — Telehealth (HOSPITAL_COMMUNITY): Payer: Self-pay

## 2017-08-30 NOTE — Telephone Encounter (Signed)
Called patient in regards to Pulmonary Rehab - Scheduled orientation on 09/19/17 at 1:30pm. Patient will attend the 10:30am exc class. Mailed packet.

## 2017-09-19 ENCOUNTER — Encounter (HOSPITAL_COMMUNITY): Payer: Self-pay

## 2017-09-19 ENCOUNTER — Encounter (HOSPITAL_COMMUNITY)
Admission: RE | Admit: 2017-09-19 | Discharge: 2017-09-19 | Disposition: A | Discharge status: Home / Self Care | Payer: No Typology Code available for payment source | Source: Ambulatory Visit | Attending: Pulmonary Disease | Admitting: Pulmonary Disease

## 2017-09-19 VITALS — BP 146/73 | HR 92 | Temp 98.2°F | Resp 22 | Ht 67.0 in | Wt 238.8 lb

## 2017-09-19 DIAGNOSIS — J438 Other emphysema: Secondary | ICD-10-CM | POA: Insufficient documentation

## 2017-09-19 HISTORY — DX: Post-traumatic stress disorder, unspecified: F43.10

## 2017-09-19 NOTE — Progress Notes (Signed)
Vincent Sutton 57 y.o. 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  He ambulated to the department from the main entrance of the hospital with no difficulties. He does carry portable oxygen. Per pt, he uses oxygen continuously. Color good, skin warm and dry. Patient is oriented to time and place. Patient's medical history, psychosocial health, and medications reviewed. Psychosocial assessment reveals pt spouse and two grandchildren. Pt is currently retired. Pt hobbies include gardening, riding on his motorcycle and spending time in the pool. Pt reports his stress level is moderate. Areas of stress/anxiety include Family Finances. Pt is caring for his two grandchildren because the birth mom who is on drugs and has been in and out of prison. Pt exhibits signs of depression, anxiety and history of PTSD. Signs of depression include anxiety, guilt and hopelessness and difficulty maintaining sleep. PHQ2/9 score 1/9. Pt shows fair  coping skills with mostly positive outlook on life because of the grandchildren. Pt has support from the [place] PTSD support group.  Pt also has medications that he feels work very well for him. Although pt admits to having suicidal thoughts he doesn't have a plan or feel he is a danger to himself.  Pt has a very good relationship with his wife and feels he can talks to her often on how he is feeling and she is supportive of him. Pt offered emotional support and reassurance and praise given to him for assuming the role of carrying for his wifes grandchildren. Will continue to monitor and evaluate progress toward psychosocial goal(s) of eliminating or developing healthy coping skills for the stressors such as financial.  Pt is seeking 100% disability through the [place].  The extra income would help for the needs of the children and household. Physical assessment reveals heart rate is normal, breath sounds  diminished on the left side due to left upper lobe removal due to lung CA.  inspiratory wheezing heard diffusely throughout both lungs. Grip strength equal, strong. Distal pulses palpable with trace swelling in his hands. Patient reports he does take medications as prescribed. Patient states he follows a Regular diet. The patient has been trying to lose weight through a healthy diet and exercise program. pt and wife are doing a keto diet with success.Patient's weight will be monitored closely. Demonstration and practice of PLB using pulse oximeter. Patient able to return demonstration satisfactorily. Safety and hand hygiene in the exercise area reviewed with patient. Patient voices understanding of the information reviewed. Department expectations discussed with patient and achievable goals were set. The patient shows enthusiasm about attending the program and we look forward to working with this nice veteran. The patient is scheduled for a 6 min walk test on 8/6  and to begin exercise on 8/13. 45 minutes was spent on a variety of activities such as assessment of the patient, obtaining baseline data including height, weight, BMI, and grip strength, verifying medical history, allergies, and current medications, and teaching patient strategies for performing tasks with less respiratory effort with emphasis on pursed lip breathing. [place], BSN Cardiac and Training and development officer

## 2017-09-25 ENCOUNTER — Encounter (HOSPITAL_COMMUNITY)
Admission: RE | Admit: 2017-09-25 | Discharge: 2017-09-25 | Disposition: A | Discharge status: Home / Self Care | Payer: No Typology Code available for payment source | Source: Ambulatory Visit | Attending: Pulmonary Disease | Admitting: Pulmonary Disease

## 2017-09-25 DIAGNOSIS — J438 Other emphysema: Secondary | ICD-10-CM
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
## 2017-09-26 ENCOUNTER — Encounter (HOSPITAL_COMMUNITY): Payer: Self-pay | Admitting: *Deleted
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
## 2017-10-02 ENCOUNTER — Encounter (HOSPITAL_COMMUNITY)
Admission: RE | Admit: 2017-10-02 | Discharge: 2017-10-02 | Disposition: A | Discharge status: Home / Self Care | Payer: No Typology Code available for payment source | Source: Ambulatory Visit | Attending: Pulmonary Disease | Admitting: Pulmonary Disease

## 2017-10-02 DIAGNOSIS — J438 Other emphysema: Secondary | ICD-10-CM | POA: Diagnosis not present

## 2017-10-02 NOTE — Progress Notes (Signed)
  Daily Session Note  Patient Details  Name: Vincent Sutton MRN: [id number] Date of Birth: 29-Oct-1960 Referring Provider:     Pulmonary Rehab Walk Test from 09/25/2017 in [place]  Referring Provider  Dr. Nelda Marseille      Encounter Date: 10/02/2017  Check In: Session Check In - 10/02/17 1136      Check-In   Supervising physician immediately available to respond to emergencies  [facility] immediately available    Physician(s)  Dr. Florene Glen    Location  [facility]    Staff Present  Su Hilt, MS, ACSM RCEP, Exercise Physiologist;Lisa Ysidro Evert, RN;Carlette Wilber Oliphant, RN, BSN;Ramon Dredge, RN, MHA    Medication changes reported      No    Fall or balance concerns reported     No    Tobacco Cessation  No Change    Warm-up and Cool-down  Performed as group-led instruction    Resistance Training Performed  Yes    VAD Patient?  No    PAD/SET Patient?  No      Pain Assessment   Currently in Pain?  No/denies       Capillary Blood Glucose: No results found for this or any previous visit (from the past 24 hour(s)).    Social History   Tobacco Use  Smoking Status Former Smoker  . Packs/day: 2.00  . Years: 30.00  . Pack years: 60.00  . Types: Cigarettes  . Last attempt to quit: 04/08/2013  . Years since quitting: 4.4  Smokeless Tobacco Never Used    Goals Met:  Achieving weight loss Personal goals reviewed Queuing for purse lip breathing  Goals Unmet:  Not Applicable  Comments: Service time is from 10:30a to 12:00p    Dr. Rush Farmer is Medical Director for Pulmonary Rehab at [facility].

## 2017-10-04 ENCOUNTER — Encounter (HOSPITAL_COMMUNITY)
Admission: RE | Admit: 2017-10-04 | Discharge: 2017-10-04 | Disposition: A | Discharge status: Home / Self Care | Payer: No Typology Code available for payment source | Source: Ambulatory Visit | Attending: Pulmonary Disease | Admitting: Pulmonary Disease

## 2017-10-04 DIAGNOSIS — J438 Other emphysema: Secondary | ICD-10-CM | POA: Diagnosis not present

## 2017-10-04 NOTE — Progress Notes (Signed)
Daily Session Note  Patient Details  Name: Vincent Sutton MRN: [id number] Date of Birth: 1960/07/26 Referring Provider:     Pulmonary Rehab Walk Test from 09/25/2017 in [place]  Referring Provider  Dr. Nelda Marseille      Encounter Date: 10/04/2017  Check In: Session Check In - 10/04/17 1024      Check-In   Supervising physician immediately available to respond to emergencies  [facility] immediately available    Physician(s)  Dr. Florene Glen    Location  [facility]    Staff Present  Su Hilt, MS, ACSM RCEP, Exercise Physiologist;Carlette Wilber Oliphant, RN, BSN;Ramon Dredge, RN, MHA    Medication changes reported      No    Fall or balance concerns reported     No    Tobacco Cessation  No Change    Warm-up and Cool-down  Performed as group-led instruction    Resistance Training Performed  Yes    VAD Patient?  No    PAD/SET Patient?  No      Pain Assessment   Currently in Pain?  No/denies    Multiple Pain Sites  No       Capillary Blood Glucose: No results found for this or any previous visit (from the past 24 hour(s)).    Social History   Tobacco Use  Smoking Status Former Smoker  . Packs/day: 2.00  . Years: 30.00  . Pack years: 60.00  . Types: Cigarettes  . Last attempt to quit: 04/08/2013  . Years since quitting: 4.4  Smokeless Tobacco Never Used    Goals Met:  Achieving weight loss Exercise tolerated well Personal goals reviewed  Goals Unmet:  Not Applicable  Comments: Service time is from 10:30a to 12:30p    Dr. Rush Farmer is Medical Director for Pulmonary Rehab at [facility].

## 2017-10-09 ENCOUNTER — Encounter (HOSPITAL_COMMUNITY)
Admission: RE | Admit: 2017-10-09 | Discharge: 2017-10-09 | Disposition: A | Discharge status: Home / Self Care | Payer: No Typology Code available for payment source | Source: Ambulatory Visit | Attending: Pulmonary Disease | Admitting: Pulmonary Disease

## 2017-10-09 DIAGNOSIS — J438 Other emphysema: Secondary | ICD-10-CM
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
## 2017-10-09 NOTE — Progress Notes (Signed)
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    Admit Weight  238 lb 12.1 oz (108.3 kg)    Goal Weight: Short Term  218 lb (98.9 kg)    Goal Weight: Long Term  200 lb (90.7 kg)    Expected Outcomes  Short Term: Continue to assess and modify interventions until short term weight is achieved;Long Term: Adherence to nutrition and physical activity/exercise program aimed toward attainment of established weight goal;Weight Loss: Understanding of general recommendations for a balanced deficit meal plan, which promotes 1-2 lb weight loss per week and includes a negative energy balance of [phone] kcal/d;Understanding recommendations for meals to include 15-35% energy as protein, 25-35% energy from fat, 35-60% energy from carbohydrates, less than 200mg  of dietary cholesterol, 20-35 gm of total fiber  daily;Understanding of distribution of calorie intake throughout the day with the consumption of 4-5 meals/snacks    Improve shortness of breath with ADL's  Yes    Intervention  Provide education, individualized exercise plan and daily activity instruction to help decrease symptoms of SOB with activities of daily living.    Expected Outcomes  Short Term: Improve cardiorespiratory fitness to achieve a reduction of symptoms when performing ADLs;Long Term: Be able to perform more ADLs without symptoms or delay the onset of symptoms       Core Components/Risk Factors/Patient Goals Review:    Core Components/Risk Factors/Patient Goals at Discharge (Final Review):    ITP Comments: ITP Comments    Row Name 09/19/17 1358           ITP Comments  Dr. Manfred Arch, Medical Director  Comments:  Pt has completed 2 exercise sessions since 10/02/17. Cherre Huger, BSN Cardiac and Training and development officer

## 2017-10-09 NOTE — Progress Notes (Signed)
Daily Session Note  Patient Details  Name: Vincent Sutton MRN: [id number] Date of Birth: 11-16-1960 Referring Provider:     Pulmonary Rehab Walk Test from 09/25/2017 in [place]  Referring Provider  Dr. Nelda Marseille      Encounter Date: 10/09/2017  Check In: Session Check In - 10/09/17 1232      Check-In   Supervising physician immediately available to respond to emergencies  [facility] immediately available    Physician(s)  Dr. Florene Glen    Location  [facility]    Staff Present  Su Hilt, MS, ACSM RCEP, Exercise Physiologist;Carlette Wilber Oliphant, RN, BSN;Ramon Dredge, RN, MHA;Lisa Ysidro Evert, RN    Medication changes reported      No    Fall or balance concerns reported     No    Tobacco Cessation  No Change    Warm-up and Cool-down  Performed as group-led instruction    Resistance Training Performed  Yes    VAD Patient?  No    PAD/SET Patient?  No      Pain Assessment   Currently in Pain?  No/denies       Capillary Blood Glucose: No results found for this or any previous visit (from the past 24 hour(s)).  Exercise Prescription Changes - 10/09/17 1300      Response to Exercise   Blood Pressure (Admit)  124/80    Blood Pressure (Exercise)  122/66    Blood Pressure (Exit)  118/68    Heart Rate (Admit)  82 bpm    Heart Rate (Exercise)  99 bpm    Heart Rate (Exit)  82 bpm    Oxygen Saturation (Admit)  97 %    Oxygen Saturation (Exercise)  91 %    Oxygen Saturation (Exit)  97 %    Rating of Perceived Exertion (Exercise)  13    Perceived Dyspnea (Exercise)  2    Duration  Progress to 45 minutes of aerobic exercise without signs/symptoms of physical distress    Intensity  THRR unchanged      Progression   Progression  Continue to progress workloads to maintain intensity without signs/symptoms of physical distress.      Resistance Training   Training Prescription  Yes    Weight  blue bands    Reps  10-15      Treadmill   MPH  2    Grade  2    Minutes  17      Bike   Level  0.5    Minutes  10      NuStep   Level  4    SPM  80    Minutes  17       Social History   Tobacco Use  Smoking Status Former Smoker  . Packs/day: 2.00  . Years: 30.00  . Pack years: 60.00  . Types: Cigarettes  . Last attempt to quit: 04/08/2013  . Years since quitting: 4.5  Smokeless Tobacco Never Used    Goals Met:  Exercise tolerated well Personal goals reviewed  Goals Unmet:  Not Applicable  Comments: Service time is from 10:30a to 12:30p    Dr. Rush Farmer is Medical Director for Pulmonary Rehab at [facility].

## 2017-10-11 ENCOUNTER — Encounter (HOSPITAL_COMMUNITY)
Admission: RE | Admit: 2017-10-11 | Discharge: 2017-10-11 | Disposition: A | Discharge status: Home / Self Care | Payer: No Typology Code available for payment source | Source: Ambulatory Visit | Attending: Pulmonary Disease | Admitting: Pulmonary Disease

## 2017-10-11 DIAGNOSIS — J438 Other emphysema: Secondary | ICD-10-CM

## 2017-10-11 NOTE — Progress Notes (Signed)
Daily Session Note  Patient Details  Name: Vincent Sutton MRN: [id number] Date of Birth: 28-Aug-1960 Referring Provider:     Pulmonary Rehab Walk Test from 09/25/2017 in [place]  Referring Provider  Dr. Nelda Marseille      Encounter Date: 10/11/2017  Check In: Session Check In - 10/11/17 1223      Check-In   Supervising physician immediately available to respond to emergencies  [facility] immediately available    Physician(s)  Dr. Algis Liming    Location  [facility]    Staff Present  Su Hilt, MS, ACSM RCEP, Exercise Physiologist;Carlette Wilber Oliphant, RN, BSN;Ramon Dredge, RN, MHA;Misk Galentine Ysidro Evert, RN    Medication changes reported      No    Fall or balance concerns reported     No    Tobacco Cessation  No Change    Warm-up and Cool-down  Performed as group-led instruction    Resistance Training Performed  Yes    VAD Patient?  No    PAD/SET Patient?  No      Pain Assessment   Currently in Pain?  No/denies    Multiple Pain Sites  No       Capillary Blood Glucose: No results found for this or any previous visit (from the past 24 hour(s)).    Social History   Tobacco Use  Smoking Status Former Smoker  . Packs/day: 2.00  . Years: 30.00  . Pack years: 60.00  . Types: Cigarettes  . Last attempt to quit: 04/08/2013  . Years since quitting: 4.5  Smokeless Tobacco Never Used    Goals Met:  Exercise tolerated well No report of cardiac concerns or symptoms Strength training completed today  Goals Unmet:  Not Applicable  Comments: Service time is from 1030 to 1215    Dr. Rush Farmer is Medical Director for Pulmonary Rehab at [facility].

## 2017-10-16 ENCOUNTER — Encounter (HOSPITAL_COMMUNITY)
Admission: RE | Admit: 2017-10-16 | Discharge: 2017-10-16 | Disposition: A | Discharge status: Home / Self Care | Payer: No Typology Code available for payment source | Source: Ambulatory Visit | Attending: Pulmonary Disease | Admitting: Pulmonary Disease

## 2017-10-16 DIAGNOSIS — J438 Other emphysema: Secondary | ICD-10-CM | POA: Diagnosis not present

## 2017-10-16 NOTE — Progress Notes (Signed)
Daily Session Note  Patient Details  Name: Vincent Sutton MRN: [id number] Date of Birth: 01/24/1961 Referring Provider:     Pulmonary Rehab Walk Test from 09/25/2017 in [place]  Referring Provider  Vincent Sutton      Encounter Date: 10/16/2017  Check In: Session Check In - 10/16/17 1151      Check-In   Supervising physician immediately available to respond to emergencies  [facility] immediately available    Physician(s)  Vincent Sutton     Location  [facility]    Staff Present  Vincent Hilt, MS, ACSM RCEP, Exercise Physiologist;Vincent Wilber Oliphant, RN, BSN;Vincent Colao Ysidro Evert, RN;Vincent Meeteetse, MS, ACSM CEP, Exercise Physiologist;Vincent Rion, RN, BSN    Medication changes reported      No    Fall or balance concerns reported     No    Tobacco Cessation  No Change    Warm-up and Cool-down  Performed as group-led instruction    Resistance Training Performed  Yes    VAD Patient?  No    PAD/SET Patient?  No      Pain Assessment   Currently in Pain?  No/denies       Capillary Blood Glucose: No results found for this or any previous visit (from the past 24 hour(s)).    Social History   Tobacco Use  Smoking Status Former Smoker  . Packs/day: 2.00  . Years: 30.00  . Pack years: 60.00  . Types: Cigarettes  . Last attempt to quit: 04/08/2013  . Years since quitting: 4.5  Smokeless Tobacco Never Used    Goals Met:  No report of cardiac concerns or symptoms Strength training completed today  Goals Unmet:  HR O2 Sat Vincent Sutton HR elevated today. State that he took his medication late today.Dropped saturation today on the treadmill  with increased workload. Had to decrease workload back to get saturation to improve. Comments: Service time is from 1030 to 1215    Vincent Sutton is Medical Director for Pulmonary Rehab at [facility].

## 2017-10-18 ENCOUNTER — Encounter (HOSPITAL_COMMUNITY)
Admission: RE | Admit: 2017-10-18 | Discharge: 2017-10-18 | Disposition: A | Discharge status: Home / Self Care | Payer: No Typology Code available for payment source | Source: Ambulatory Visit | Attending: Pulmonary Disease | Admitting: Pulmonary Disease

## 2017-10-18 DIAGNOSIS — J438 Other emphysema: Secondary | ICD-10-CM | POA: Diagnosis not present

## 2017-10-18 NOTE — Progress Notes (Signed)
Daily Session Note  Patient Details  Name: Vincent Sutton MRN: [id number] Date of Birth: 09-May-1960 Referring Provider:     Pulmonary Rehab Walk Test from 09/25/2017 in [place]  Referring Provider  Dr. Nelda Marseille      Encounter Date: 10/18/2017  Check In: Session Check In - 10/18/17 1101      Check-In   Supervising physician immediately available to respond to emergencies  [facility] immediately available    Physician(s)  Dr. Florene Glen    Location  [facility]    Staff Present  Su Hilt, MS, ACSM RCEP, Exercise Physiologist;Carlette Wilber Oliphant, RN, BSN;Ramon Dredge, RN, MHA    Medication changes reported      No    Fall or balance concerns reported     No    Tobacco Cessation  No Change    Warm-up and Cool-down  Performed as group-led instruction    Resistance Training Performed  Yes    VAD Patient?  No    PAD/SET Patient?  No      Pain Assessment   Currently in Pain?  No/denies    Multiple Pain Sites  No       Capillary Blood Glucose: No results found for this or any previous visit (from the past 24 hour(s)).    Social History   Tobacco Use  Smoking Status Former Smoker  . Packs/day: 2.00  . Years: 30.00  . Pack years: 60.00  . Types: Cigarettes  . Last attempt to quit: 04/08/2013  . Years since quitting: 4.5  Smokeless Tobacco Never Used    Goals Met:  Exercise tolerated well  Goals Unmet:  Not Applicable  Comments: Service time is from 10:30a to 12:45p    Dr. Rush Farmer is Medical Director for Pulmonary Rehab at [facility].

## 2017-10-23 ENCOUNTER — Encounter (HOSPITAL_COMMUNITY)
Admission: RE | Admit: 2017-10-23 | Discharge: 2017-10-23 | Disposition: A | Discharge status: Home / Self Care | Payer: No Typology Code available for payment source | Source: Ambulatory Visit | Attending: Pulmonary Disease | Admitting: Pulmonary Disease

## 2017-10-23 VITALS — Wt 244.0 lb

## 2017-10-23 DIAGNOSIS — J438 Other emphysema: Secondary | ICD-10-CM | POA: Insufficient documentation

## 2017-10-23 NOTE — Progress Notes (Signed)
Daily Session Note  Patient Details  Name: STEPAN VERRETTE MRN: [id number] Date of Birth: [birth year] Referring Provider:     Pulmonary Rehab Walk Test from 09/25/2017 in [place]  Referring Provider  Dr. Nelda Marseille      Encounter Date: 10/23/2017  Check In: Session Check In - 10/23/17 1020      Check-In   Supervising physician immediately available to respond to emergencies  [facility] immediately available    Physician(s)  Dr. Karleen Hampshire    Location  [facility]    Staff Present  Su Hilt, MS, ACSM RCEP, Exercise Physiologist;Carlette Wilber Oliphant, RN, BSN;Ramon Dredge, RN, MHA;Lisa Ysidro Evert, RN    Medication changes reported      No    Fall or balance concerns reported     No    Tobacco Cessation  No Change    Warm-up and Cool-down  Performed as group-led instruction    Resistance Training Performed  Yes    VAD Patient?  No    PAD/SET Patient?  No      Pain Assessment   Currently in Pain?  No/denies    Multiple Pain Sites  No       Capillary Blood Glucose: No results found for this or any previous visit (from the past 24 hour(s)).  Exercise Prescription Changes - 10/23/17 1200      Response to Exercise   Blood Pressure (Admit)  120/78    Blood Pressure (Exercise)  160/70    Blood Pressure (Exit)  128/70    Heart Rate (Admit)  88 bpm    Heart Rate (Exercise)  125 bpm    Heart Rate (Exit)  107 bpm    Oxygen Saturation (Admit)  98 %    Oxygen Saturation (Exercise)  89 %    Oxygen Saturation (Exit)  97 %    Rating of Perceived Exertion (Exercise)  15    Perceived Dyspnea (Exercise)  3    Duration  Progress to 45 minutes of aerobic exercise without signs/symptoms of physical distress    Intensity  THRR unchanged      Progression   Progression  Continue to progress workloads to maintain intensity without signs/symptoms of physical distress.      Resistance Training   Training Prescription  Yes    Weight  blue  bands    Reps  10-15      Oxygen   Oxygen  Continuous    Liters  4      Treadmill   MPH  2    Grade  3    Minutes  17      Bike   Level  0.7    Minutes  10      NuStep   Level  5    SPM  80    Minutes  17       Social History   Tobacco Use  Smoking Status Former Smoker  . Packs/day: 2.00  . Years: 30.00  . Pack years: 60.00  . Types: Cigarettes  . Last attempt to quit: 04/08/2013  . Years since quitting: 4.5  Smokeless Tobacco Never Used    Goals Met:  Exercise tolerated well  Goals Unmet:  Not Applicable  Comments: Service time is from 10:30a to 12:15p    Dr. Rush Farmer is Medical Director for Pulmonary Rehab at [facility].

## 2017-10-24 NOTE — Progress Notes (Signed)
I have reviewed a Home Exercise Prescription with Vincent Sutton . Vincent Sutton is walking 20 minutes a day at home.  The patient was advised to walk 3 days a week for 30-45 minutes.  [name] and I discussed how to progress their exercise prescription.  The patient stated that their goals were 20 pounds and improve diet.  The patient stated that they understand the exercise prescription.  We reviewed exercise guidelines, target heart rate during exercise, RPE Scale, weather conditions, NTG use, endpoints for exercise, warmup and cool down.  Patient is encouraged to come to me with any questions. I will continue to follow up with the patient to assist them with progression and safety.

## 2017-10-25 ENCOUNTER — Telehealth (HOSPITAL_COMMUNITY): Payer: Self-pay

## 2017-10-25 ENCOUNTER — Encounter (HOSPITAL_COMMUNITY): Payer: No Typology Code available for payment source

## 2017-10-30 ENCOUNTER — Encounter (HOSPITAL_COMMUNITY)
Admission: RE | Admit: 2017-10-30 | Discharge: 2017-10-30 | Disposition: A | Discharge status: Home / Self Care | Payer: No Typology Code available for payment source | Source: Ambulatory Visit | Attending: Pulmonary Disease | Admitting: Pulmonary Disease

## 2017-10-30 DIAGNOSIS — J438 Other emphysema: Secondary | ICD-10-CM | POA: Diagnosis not present

## 2017-10-30 NOTE — Progress Notes (Signed)
Daily Session Note  Patient Details  Name: Vincent Sutton MRN: [id number] Date of Birth: 02/03/1961 Referring Provider:     Pulmonary Rehab Walk Test from 09/25/2017 in [facility]  Referring Provider  Dr. Yacoub      Encounter Date: 10/30/2017  Check In: Session Check In - 10/30/17 1027      Check-In   Supervising physician immediately available to respond to emergencies  [facility] immediately available    Physician(s)  Dr. Lama     Location  [facility]    Staff Present  Molly DiVincenzo, MS, ACSM RCEP, Exercise Physiologist;Carlette Carlton, RN, BSN;Annedrea Stackhouse, RN, MHA;Lisa Hughes, RN    Medication changes reported      No    Fall or balance concerns reported     No    Tobacco Cessation  No Change    Warm-up and Cool-down  Performed as group-led instruction    Resistance Training Performed  Yes    VAD Patient?  No    PAD/SET Patient?  No      Pain Assessment   Currently in Pain?  No/denies    Multiple Pain Sites  No       Capillary Blood Glucose: No results found for this or any previous visit (from the past 24 hour(s)).    Social History   Tobacco Use  Smoking Status Former Smoker  . Packs/day: 2.00  . Years: 30.00  . Pack years: 60.00  . Types: Cigarettes  . Last attempt to quit: 04/08/2013  . Years since quitting: 4.5  Smokeless Tobacco Never Used    Goals Met:  Exercise tolerated well  Goals Unmet:  Not Applicable  Comments: Service time is from 10:30a to 12:20p    Dr. Wesam G. Yacoub is Medical Director for Pulmonary Rehab at [facility]. 

## 2017-11-01 ENCOUNTER — Encounter (HOSPITAL_COMMUNITY)
Admission: RE | Admit: 2017-11-01 | Discharge: 2017-11-01 | Disposition: A | Discharge status: Home / Self Care | Payer: No Typology Code available for payment source | Source: Ambulatory Visit | Attending: Pulmonary Disease | Admitting: Pulmonary Disease

## 2017-11-01 DIAGNOSIS — J438 Other emphysema: Secondary | ICD-10-CM | POA: Diagnosis not present

## 2017-11-01 NOTE — Progress Notes (Signed)
Daily Session Note  Patient Details  Name: WAEL MAESTAS MRN: [id number] Date of Birth: 08-23-1960 Referring Provider:     Pulmonary Rehab Walk Test from 09/25/2017 in [place]  Referring Provider  Dr. Nelda Marseille      Encounter Date: 11/01/2017  Check In: Session Check In - 11/01/17 1218      Check-In   Supervising physician immediately available to respond to emergencies  [facility] immediately available    Physician(s)  Dr. Dyann Kief    Location  [facility]    Staff Present  Maurice Small, RN, BSN;Molly DiVincenzo, MS, ACSM RCEP, Exercise Physiologist;Ruble Buttler Ysidro Evert, Felipe Drone, RN, MHA    Medication changes reported      No    Fall or balance concerns reported     No    Tobacco Cessation  No Change    Warm-up and Cool-down  Performed as group-led instruction    Resistance Training Performed  Yes    VAD Patient?  No    PAD/SET Patient?  No      Pain Assessment   Currently in Pain?  No/denies    Pain Score  0-No pain    Multiple Pain Sites  No       Capillary Blood Glucose: No results found for this or any previous visit (from the past 24 hour(s)).    Social History   Tobacco Use  Smoking Status Former Smoker  . Packs/day: 2.00  . Years: 30.00  . Pack years: 60.00  . Types: Cigarettes  . Last attempt to quit: 04/08/2013  . Years since quitting: 4.5  Smokeless Tobacco Never Used    Goals Met:  Exercise tolerated well No report of cardiac concerns or symptoms Strength training completed today  Goals Unmet:  Not Applicable  Comments: Service time is from 1030 to 1205    Dr. Rush Farmer is Medical Director for Pulmonary Rehab at [facility].

## 2017-11-06 ENCOUNTER — Encounter (HOSPITAL_COMMUNITY)
Admission: RE | Admit: 2017-11-06 | Discharge: 2017-11-06 | Disposition: A | Discharge status: Home / Self Care | Payer: No Typology Code available for payment source | Source: Ambulatory Visit | Attending: Pulmonary Disease | Admitting: Pulmonary Disease

## 2017-11-06 VITALS — Wt 236.1 lb

## 2017-11-06 DIAGNOSIS — J438 Other emphysema: Secondary | ICD-10-CM

## 2017-11-07 NOTE — Progress Notes (Addendum)
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      Initial Vocational Rehab Evaluation & Intervention   Assessment shows need for Vocational Rehabilitation  No       Education: Education Goals: Education classes will be provided on a weekly basis, covering required topics. Participant will state understanding/return demonstration of topics presented.  Learning Barriers/Preferences: Learning Barriers/Preferences - 09/19/17 1419      Learning Barriers/Preferences   Learning Barriers  Hearing    Learning Preferences  Group Instruction;Individual Instruction;Written Material       Education Topics: Count Your Pulse:  -Group instruction provided by verbal instruction, demonstration, patient participation and written materials to support subject.  Instructors address importance of being able to find your  pulse and how to count your pulse when at home without a heart monitor.  Patients get hands on experience counting their pulse with staff help and individually.   Heart Attack, Angina, and Risk Factor Modification:  -Group instruction provided by verbal instruction, video, and written materials to support subject.  Instructors address signs and symptoms of angina and heart attacks.    Also discuss risk factors for heart disease and how to make changes to improve heart health risk factors.   Functional Fitness:  -Group instruction provided by verbal instruction, demonstration, patient participation, and written materials to support subject.  Instructors address safety measures for doing things around the house.  Discuss how to get up and down off the floor, how to pick things up properly, how to safely get out of a chair without assistance, and balance training.   Meditation and Mindfulness:  -Group instruction provided by verbal instruction, patient participation, and written materials to support subject.  Instructor addresses importance of mindfulness and meditation practice to help reduce stress and improve awareness.  Instructor also leads participants through a meditation exercise.    PULMONARY REHAB CHRONIC OBSTRUCTIVE PULMONARY DISEASE from 10/30/2017 in [place]  Date  10/04/17  Educator  [place]  Instruction Review Code  2- Demonstrated Understanding      Stretching for Flexibility and Mobility:  -Group instruction provided by verbal instruction, patient participation, and written materials to support subject.  Instructors lead participants through series of stretches that are designed to increase flexibility thus improving mobility.  These stretches are additional exercise for major muscle groups that are typically performed during regular warm up and cool down.   Hands Only CPR:  -Group verbal, video, and participation provides a basic overview of AHA  guidelines for community CPR. Role-play of emergencies allow participants the opportunity to practice calling for help and chest compression technique with discussion of AED use.   Hypertension: -Group verbal and written instruction that provides a basic overview of hypertension including the most recent diagnostic guidelines, risk factor reduction with self-care instructions and medication management.    Nutrition I class: Heart Healthy Eating:  -Group instruction provided by PowerPoint slides, verbal discussion, and written materials to support subject matter. The instructor gives an explanation and review of the Therapeutic Lifestyle Changes diet recommendations, which includes a discussion on lipid goals, dietary fat, sodium, fiber, plant stanol/sterol esters, sugar, and the components of a well-balanced, healthy diet.   Nutrition  II class: Lifestyle Skills:  -Group instruction provided by PowerPoint slides, verbal discussion, and written materials to support subject matter. The instructor gives an explanation and review of label reading, grocery shopping for heart health, heart healthy recipe modifications, and ways to make healthier choices when eating out.   Diabetes Question & Answer:  -Group instruction provided by PowerPoint slides, verbal discussion, and written materials to support subject matter. The instructor gives an explanation and review of diabetes co-morbidities, pre- and post-prandial blood glucose goals, pre-exercise blood glucose goals, signs, symptoms, and treatment of hypoglycemia and hyperglycemia, and foot care basics.   Diabetes Blitz:  -Group instruction provided by PowerPoint slides, verbal discussion, and written materials to support subject matter. The instructor gives an explanation and review of the physiology behind type 1 and type 2 diabetes, diabetes medications and rational behind using different medications, pre- and post-prandial blood glucose  recommendations and Hemoglobin A1c goals, diabetes diet, and exercise including blood glucose guidelines for exercising safely.    Portion Distortion:  -Group instruction provided by PowerPoint slides, verbal discussion, written materials, and food models to support subject matter. The instructor gives an explanation of serving size versus portion size, changes in portions sizes over the last 20 years, and what consists of a serving from each food group.   Stress Management:  -Group instruction provided by verbal instruction, video, and written materials to support subject matter.  Instructors review role of stress in heart disease and how to cope with stress positively.     Exercising on Your Own:  -Group instruction provided by verbal instruction, power point, and written materials to support subject.  Instructors discuss benefits of exercise, components of exercise, frequency and intensity of exercise, and end points for exercise.  Also discuss use of nitroglycerin and activating EMS.  Review options of places to exercise outside of rehab.  Review guidelines for sex with heart disease.   Cardiac Drugs I:  -Group instruction provided by verbal instruction and written materials to support subject.  Instructor reviews cardiac drug classes: antiplatelets, anticoagulants, beta blockers, and statins.  Instructor discusses reasons, side effects, and lifestyle considerations for each drug class.   Cardiac Drugs II:  -Group instruction provided by verbal instruction and written materials to support subject.  Instructor reviews cardiac drug classes: angiotensin converting enzyme inhibitors (ACE-I), angiotensin II receptor blockers (ARBs), nitrates, and calcium channel blockers.  Instructor discusses reasons, side effects, and lifestyle considerations for each drug class.   Anatomy and Physiology of the Circulatory System:  Group verbal and written instruction and models provide basic cardiac anatomy  and physiology, with the coronary electrical and arterial systems. Review of: AMI, Angina, Valve disease, Heart Failure, Peripheral Artery Disease, Cardiac Arrhythmia, Pacemakers, and the ICD.   Other Education:  -Group or individual verbal, written, or video instructions that support the educational goals of the cardiac rehab program.   Holiday Eating Survival Tips:  -Group instruction provided by PowerPoint slides, verbal discussion, and written materials to support subject matter. The instructor gives patients tips, tricks, and techniques to help them not only survive but enjoy the holidays despite the onslaught of food that accompanies the holidays.   Knowledge Questionnaire Score: Knowledge Questionnaire Score - 09/26/17 1050      Knowledge Questionnaire Score   Pre Score  14/18       Core Components/Risk Factors/Patient Goals at Admission: Personal Goals and Risk Factors at Admission - 09/19/17 1410      Core Components/Risk Factors/Patient Goals on Admission  Weight Management  Yes;Obesity    Intervention  Weight Management: Develop a combined nutrition and exercise program designed to reach desired caloric intake, while maintaining appropriate intake of nutrient and fiber, sodium and fats, and appropriate energy expenditure required for the weight goal.;Weight Management: Provide education and appropriate resources to help participant work on and attain dietary goals.;Weight Management/Obesity: Establish reasonable short term and long term weight goals.;Obesity: Provide education and appropriate resources to help participant work on and attain dietary goals.    Admit Weight  238 lb 12.1 oz (108.3 kg)    Goal Weight: Short Term  218 lb (98.9 kg)    Goal Weight: Long Term  200 lb (90.7 kg)    Expected Outcomes  Short Term: Continue to assess and modify interventions until short term weight is achieved;Long Term: Adherence to nutrition and physical activity/exercise program aimed  toward attainment of established weight goal;Weight Loss: Understanding of general recommendations for a balanced deficit meal plan, which promotes 1-2 lb weight loss per week and includes a negative energy balance of [phone] kcal/d;Understanding recommendations for meals to include 15-35% energy as protein, 25-35% energy from fat, 35-60% energy from carbohydrates, less than 200mg  of dietary cholesterol, 20-35 gm of total fiber daily;Understanding of distribution of calorie intake throughout the day with the consumption of 4-5 meals/snacks    Improve shortness of breath with ADL's  Yes    Intervention  Provide education, individualized exercise plan and daily activity instruction to help decrease symptoms of SOB with activities of daily living.    Expected Outcomes  Short Term: Improve cardiorespiratory fitness to achieve a reduction of symptoms when performing ADLs;Long Term: Be able to perform more ADLs without symptoms or delay the onset of symptoms       Core Components/Risk Factors/Patient Goals Review:  Goals and Risk Factor Review    Row Name 10/09/17 0948 10/10/17 0929 11/07/17 1348         Core Components/Risk Factors/Patient Goals Review   Personal Goals Review  Weight Management/Obesity;Improve shortness of breath with ADL's;Increase knowledge of respiratory medications and ability to use respiratory devices properly.;Develop more efficient breathing techniques such as purse lipped breathing and diaphragmatic breathing and practicing self-pacing with activity.  Weight Management/Obesity;Improve shortness of breath with ADL's;Increase knowledge of respiratory medications and ability to use respiratory devices properly.;Develop more efficient breathing techniques such as purse lipped breathing and diaphragmatic breathing and practicing self-pacing with activity.  Weight Management/Obesity;Improve shortness of breath with ADL's;Increase knowledge of respiratory medications and ability to use  respiratory devices properly.;Develop more efficient breathing techniques such as purse lipped breathing and diaphragmatic breathing and practicing self-pacing with activity.     Review  Pt has completed 2 exercise sessions since 8/13.  Unable to adquately assess pt progress toward pulmonary goals. I anticipate in the next 30 days pt will show progress toward pulmonary goals.  -  Pt has completed 10  exercise sessions since 8/13.  Pt is begining to interact with fellow participants however pt interacts well with staff and often is joking with them.  Pt has attended respiratory medication class.  Pt needs additonal coaching for proper breathing techniques.  Pt with minimal weight loss of .3kg.  Pt has not been able to engage in home exercise out outside of rehab although he is [place] with his young grandchildren.  Hopefully as pt becomes more conditioned he will be able to begin some exercise.  Pt with increase on treadmill to 2/4/4, airydyne .9 and nustep at level 5I  anticipate in the next 30 days pt will show progress toward pulmonary goals.     Expected Outcomes  See "admission goals"  See Admission Goals/Outcomes  See Admission Goals/Outcomes        Core Components/Risk Factors/Patient Goals at Discharge (Final Review):  Goals and Risk Factor Review - 11/07/17 1348      Core Components/Risk Factors/Patient Goals Review   Personal Goals Review  Weight Management/Obesity;Improve shortness of breath with ADL's;Increase knowledge of respiratory medications and ability to use respiratory devices properly.;Develop more efficient breathing techniques such as purse lipped breathing and diaphragmatic breathing and practicing self-pacing with activity.    Review  Pt has completed 10  exercise sessions since 8/13.  Pt is begining to interact with fellow participants however pt interacts well with staff and often is joking with them.  Pt has attended respiratory medication class.  Pt needs additonal coaching for  proper breathing techniques.  Pt with minimal weight loss of .3kg.  Pt has not been able to engage in home exercise out outside of rehab although he is [place] with his young grandchildren.  Hopefully as pt becomes more conditioned he will be able to begin some exercise.  Pt with increase on treadmill to 2/4/4, airydyne .9 and nustep at level 5I anticipate in the next 30 days pt will show progress toward pulmonary goals.    Expected Outcomes  See Admission Goals/Outcomes       ITP Comments: ITP Comments    Row Name 09/19/17 1358 10/10/17 0929 11/07/17 1345       ITP Comments  Dr. Manfred Arch, Medical Director  Dr. Manfred Arch, Medical Director  Dr. Manfred Arch, Medical Director Pulmonary Rehab        Comments: Pt has completed 10 exercise sessions. Continue to monitor progress. Cherre Huger, BSN Cardiac and Training and development officer

## 2017-11-08 ENCOUNTER — Encounter (HOSPITAL_COMMUNITY)
Admission: RE | Admit: 2017-11-08 | Discharge: 2017-11-08 | Disposition: A | Discharge status: Home / Self Care | Payer: No Typology Code available for payment source | Source: Ambulatory Visit | Attending: Pulmonary Disease | Admitting: Pulmonary Disease

## 2017-11-08 DIAGNOSIS — J438 Other emphysema: Secondary | ICD-10-CM

## 2017-11-08 NOTE — Progress Notes (Signed)
Daily Session Note  Patient Details  Name: Vincent Sutton MRN: [id number] Date of Birth: October 16, 1960 Referring Provider:     Pulmonary Rehab Walk Test from 09/25/2017 in [place]  Referring Provider  Dr. Nelda Marseille      Encounter Date: 11/08/2017  Check In: Session Check In - 11/08/17 1230      Check-In   Supervising physician immediately available to respond to emergencies  [facility] immediately available    Physician(s)  Dr. Toma Copier    Location  [facility]    Staff Present  Maurice Small, RN, BSN;Molly DiVincenzo, MS, ACSM RCEP, Exercise Physiologist;Syrus Nakama Ysidro Evert, Felipe Drone, RN, MHA    Medication changes reported      No    Fall or balance concerns reported     No    Tobacco Cessation  No Change    Warm-up and Cool-down  Performed as group-led instruction    Resistance Training Performed  Yes    VAD Patient?  No    PAD/SET Patient?  No      Pain Assessment   Currently in Pain?  No/denies    Multiple Pain Sites  No       Capillary Blood Glucose: No results found for this or any previous visit (from the past 24 hour(s)).    Social History   Tobacco Use  Smoking Status Former Smoker  . Packs/day: 2.00  . Years: 30.00  . Pack years: 60.00  . Types: Cigarettes  . Last attempt to quit: 04/08/2013  . Years since quitting: 4.5  Smokeless Tobacco Never Used    Goals Met:  Exercise tolerated well No report of cardiac concerns or symptoms Strength training completed today  Goals Unmet:  Not Applicable  Comments: Service time is from 1030 to 1220. Attended the Sedentary Lifestyle class.    Dr. Rush Farmer is Medical Director for Pulmonary Rehab at [facility].

## 2017-11-08 NOTE — Progress Notes (Signed)
While exercising at Pulmonary Rehab, Vincent Sutton consistently uses 4 liters of continuous flow oxygen. This is provided by Pulmonary Rehab [facility]. At home, the patient currently uses continous for their home oxygen system. While exercising at home, the patient was instructed to use 4 liters. In Pulmonary Rehab today,Vincent Sutton walked the track utilizing their own home oxygen system. They used 4 liters continuous fow which maintained their oxygen saturation above 93%.

## 2017-11-13 ENCOUNTER — Encounter (HOSPITAL_COMMUNITY)
Admission: RE | Admit: 2017-11-13 | Discharge: 2017-11-13 | Disposition: A | Discharge status: Home / Self Care | Payer: No Typology Code available for payment source | Source: Ambulatory Visit | Attending: Pulmonary Disease | Admitting: Pulmonary Disease

## 2017-11-13 DIAGNOSIS — J438 Other emphysema: Secondary | ICD-10-CM | POA: Diagnosis not present

## 2017-11-13 NOTE — Progress Notes (Signed)
Daily Session Note  Patient Details  Name: Vincent Sutton MRN: [id number] Date of Birth: 09/23/1960 Referring Provider:     Pulmonary Rehab Walk Test from 09/25/2017 in [facility]  Referring Provider  Dr. Yacoub      Encounter Date: 11/13/2017  Check In: Session Check In - 11/13/17 1017      Check-In   Supervising physician immediately available to respond to emergencies  [facility] immediately available    Physician(s)  Dr. Danford    Location  [facility]    Staff Present  Carlette Carlton, RN, BSN;Molly DiVincenzo, MS, ACSM RCEP, Exercise Physiologist;Annedrea Stackhouse, RN, MHA    Medication changes reported      No    Fall or balance concerns reported     No    Tobacco Cessation  No Change    Warm-up and Cool-down  Performed as group-led instruction    Resistance Training Performed  Yes    VAD Patient?  No    PAD/SET Patient?  No      Pain Assessment   Currently in Pain?  No/denies    Multiple Pain Sites  No       Capillary Blood Glucose: No results found for this or any previous visit (from the past 24 hour(s)).    Social History   Tobacco Use  Smoking Status Former Smoker  . Packs/day: 2.00  . Years: 30.00  . Pack years: 60.00  . Types: Cigarettes  . Last attempt to quit: 04/08/2013  . Years since quitting: 4.6  Smokeless Tobacco Never Used    Goals Met:  Exercise tolerated well  Goals Unmet:  Not Applicable  Comments: Service time is from 10:30a to 12:30p    Dr. Wesam G. Yacoub is Medical Director for Pulmonary Rehab at  Hospital. 

## 2017-11-15 ENCOUNTER — Encounter (HOSPITAL_COMMUNITY)
Admission: RE | Admit: 2017-11-15 | Discharge: 2017-11-15 | Disposition: A | Discharge status: Home / Self Care | Payer: No Typology Code available for payment source | Source: Ambulatory Visit | Attending: Pulmonary Disease | Admitting: Pulmonary Disease

## 2017-11-15 VITALS — Wt 236.6 lb

## 2017-11-15 DIAGNOSIS — J438 Other emphysema: Secondary | ICD-10-CM

## 2017-11-15 NOTE — Progress Notes (Signed)
Daily Session Note  Patient Details  Name: Vincent Sutton MRN: [id number] Date of Birth: August 06, 1960 Referring Provider:     Pulmonary Rehab Walk Test from 09/25/2017 in [place]  Referring Provider  Dr. Nelda Marseille      Encounter Date: 11/15/2017  Check In: Session Check In - 11/15/17 1014      Check-In   Supervising physician immediately available to respond to emergencies  [facility] immediately available    Physician(s)  Dr. Loleta Books    Location  [facility]    Staff Present  Maurice Small, RN, BSN;Joyell Emami, MS, ACSM RCEP, Exercise Physiologist;Annedrea Stackhouse, RN, [place]    Medication changes reported      No    Fall or balance concerns reported     No    Tobacco Cessation  No Change    Warm-up and Cool-down  Performed as group-led instruction    Resistance Training Performed  Yes    VAD Patient?  No    PAD/SET Patient?  No      Pain Assessment   Currently in Pain?  No/denies    Multiple Pain Sites  No       Capillary Blood Glucose: No results found for this or any previous visit (from the past 24 hour(s)).    Social History   Tobacco Use  Smoking Status Former Smoker  . Packs/day: 2.00  . Years: 30.00  . Pack years: 60.00  . Types: Cigarettes  . Last attempt to quit: 04/08/2013  . Years since quitting: 4.6  Smokeless Tobacco Never Used    Goals Met:  Exercise tolerated well  Goals Unmet:  Not Applicable  Comments: Service time is from 10:30a to 12:30p    Dr. Rush Farmer is Medical Director for Pulmonary Rehab at [facility].

## 2017-11-20 ENCOUNTER — Encounter (HOSPITAL_COMMUNITY): Admission: RE | Admit: 2017-11-20 | Payer: No Typology Code available for payment source | Source: Ambulatory Visit

## 2017-11-20 NOTE — Progress Notes (Signed)
Daily Session Note  Patient Details  Name: ALYAS CREARY MRN: [id number] Date of Birth: 1961/01/31 Referring Provider:     Pulmonary Rehab Walk Test from 09/25/2017 in [place]  Referring Provider  Dr. Nelda Marseille      Encounter Date: 11/15/2017  Check In:   Capillary Blood Glucose: No results found for this or any previous visit (from the past 24 hour(s)).  Exercise Prescription Changes - 11/20/17 1600      Response to Exercise   Blood Pressure (Admit)  128/84    Blood Pressure (Exercise)  148/82    Blood Pressure (Exit)  122/70    Heart Rate (Admit)  85 bpm    Heart Rate (Exercise)  118 bpm    Heart Rate (Exit)  99 bpm    Oxygen Saturation (Admit)  96 %    Oxygen Saturation (Exercise)  93 %    Oxygen Saturation (Exit)  96 %    Rating of Perceived Exertion (Exercise)  11    Perceived Dyspnea (Exercise)  2    Duration  Progress to 45 minutes of aerobic exercise without signs/symptoms of physical distress    Intensity  THRR unchanged      Progression   Progression  Continue to progress workloads to maintain intensity without signs/symptoms of physical distress.      Resistance Training   Training Prescription  Yes    Weight  blue bands    Reps  10-15      Oxygen   Oxygen  Continuous    Liters  4      Treadmill   MPH  2.4    Grade  4    Minutes  17      Bike   Level  1    Minutes  10      NuStep   Level  --    SPM  --    Minutes  --      Home Exercise Plan   Plans to continue exercise at  --    Frequency  --       Social History   Tobacco Use  Smoking Status Former Smoker  . Packs/day: 2.00  . Years: 30.00  . Pack years: 60.00  . Types: Cigarettes  . Last attempt to quit: 04/08/2013  . Years since quitting: 4.6  Smokeless Tobacco Never Used    Goals Met:  Exercise tolerated well  Goals Unmet:  Not Applicable  Comments: Service time is from 10:30a to 12:30p    Dr. Rush Farmer is Medical Director for  Pulmonary Rehab at [facility].

## 2017-11-22 ENCOUNTER — Encounter (HOSPITAL_COMMUNITY)
Admission: RE | Admit: 2017-11-22 | Discharge: 2017-11-22 | Disposition: A | Discharge status: Home / Self Care | Payer: No Typology Code available for payment source | Source: Ambulatory Visit | Attending: Pulmonary Disease | Admitting: Pulmonary Disease

## 2017-11-22 DIAGNOSIS — J438 Other emphysema: Secondary | ICD-10-CM | POA: Insufficient documentation

## 2017-11-22 NOTE — Progress Notes (Signed)
Daily Session Note  Patient Details  Name: TOSHIYUKI FREDELL MRN: [id number] Date of Birth: Jan 07, 1961 Referring Provider:     Pulmonary Rehab Walk Test from 09/25/2017 in [place]  Referring Provider  Dr. Nelda Marseille      Encounter Date: 11/22/2017  Check In: Session Check In - 11/22/17 1219      Check-In   Supervising physician immediately available to respond to emergencies  [facility] immediately available    Physician(s)  Dr. Horris Latino    Location  [facility]    Staff Present  Maurice Small, RN, BSN;Molly DiVincenzo, MS, ACSM RCEP, Exercise Physiologist;Annedrea Rosezella [place], RN, Ramonita Lab, RN    Medication changes reported      No    Fall or balance concerns reported     No    Tobacco Cessation  No Change    Warm-up and Cool-down  Not performed (comment)    Resistance Training Performed  Yes    VAD Patient?  No    PAD/SET Patient?  No      Pain Assessment   Currently in Pain?  No/denies    Multiple Pain Sites  No       Capillary Blood Glucose: No results found for this or any previous visit (from the past 24 hour(s)).    Social History   Tobacco Use  Smoking Status Former Smoker  . Packs/day: 2.00  . Years: 30.00  . Pack years: 60.00  . Types: Cigarettes  . Last attempt to quit: 04/08/2013  . Years since quitting: 4.6  Smokeless Tobacco Never Used    Goals Met:  Exercise tolerated well No report of cardiac concerns or symptoms Strength training completed today  Goals Unmet:  Not Applicable  Comments: Service time is from 1030 to 1215    Dr. Rush Farmer is Medical Director for Pulmonary Rehab at [facility].

## 2017-11-27 ENCOUNTER — Encounter (HOSPITAL_COMMUNITY)
Admission: RE | Admit: 2017-11-27 | Discharge: 2017-11-27 | Disposition: A | Discharge status: Home / Self Care | Payer: No Typology Code available for payment source | Source: Ambulatory Visit | Attending: Pulmonary Disease | Admitting: Pulmonary Disease

## 2017-11-27 DIAGNOSIS — J438 Other emphysema: Secondary | ICD-10-CM | POA: Diagnosis present

## 2017-11-27 NOTE — Progress Notes (Signed)
Daily Session Note  Patient Details  Name: Vincent Sutton MRN: [id number] Date of Birth: 08/07/1960 Referring Provider:     Pulmonary Rehab Walk Test from 09/25/2017 in [name]  Referring Provider  Vincent Sutton      Encounter Date: 11/27/2017  Check In: Session Check In - 11/27/17 1008      Check-In   Supervising physician immediately available to respond to emergencies  [facility] immediately available    Physician(s)  Vincent Sutton    Location  [facility]    Staff Present  Vincent Hilt, MS, ACSM RCEP, Exercise Physiologist;Vincent Rosezella [place], RN, Vincent Lab, RN    Medication changes reported      No    Fall or balance concerns reported     No    Tobacco Cessation  No Change    Warm-up and Cool-down  Performed as group-led instruction    Resistance Training Performed  Yes    VAD Patient?  No    PAD/SET Patient?  No      Pain Assessment   Currently in Pain?  No/denies    Multiple Pain Sites  No       Capillary Blood Glucose: No results found for this or any previous visit (from the past 24 hour(s)).    Social History   Tobacco Use  Smoking Status Former Smoker  . Packs/day: 2.00  . Years: 30.00  . Pack years: 60.00  . Types: Cigarettes  . Last attempt to quit: 04/08/2013  . Years since quitting: 4.6  Smokeless Tobacco Never Used    Goals Met:  Exercise tolerated well  Goals Unmet:  Not Applicable  Comments: Service time is from 10:30a to 12:15p    Vincent Sutton is Medical Director for Pulmonary Rehab at [facility].

## 2017-11-29 ENCOUNTER — Encounter (HOSPITAL_COMMUNITY)
Admission: RE | Admit: 2017-11-29 | Discharge: 2017-11-29 | Disposition: A | Discharge status: Home / Self Care | Payer: No Typology Code available for payment source | Source: Ambulatory Visit | Attending: Pulmonary Disease | Admitting: Pulmonary Disease

## 2017-11-29 DIAGNOSIS — J438 Other emphysema: Secondary | ICD-10-CM

## 2017-11-29 NOTE — Progress Notes (Signed)
Daily Session Note  Patient Details  Name: Vincent Sutton MRN: [id number] Date of Birth: 11-Nov-1960 Referring Provider:     Pulmonary Rehab Walk Test from 09/25/2017 in [place]  Referring Provider  Dr. Nelda Marseille      Encounter Date: 11/29/2017  Check In: Session Check In - 11/29/17 1250      Check-In   Supervising physician immediately available to respond to emergencies  [facility] immediately available    Staff Present  Su Hilt, MS, ACSM RCEP, Exercise Physiologist;Dalton Kris Mouton, MS, Exercise Physiologist;Lisa Ysidro Evert, Felipe Drone, RN, MHA    Medication changes reported      No    Fall or balance concerns reported     No    Tobacco Cessation  No Change    Warm-up and Cool-down  Performed as group-led instruction    Resistance Training Performed  Yes    VAD Patient?  No    PAD/SET Patient?  No      Pain Assessment   Currently in Pain?  No/denies    Pain Score  0-No pain    Multiple Pain Sites  No       Capillary Blood Glucose: No results found for this or any previous visit (from the past 24 hour(s)).    Social History   Tobacco Use  Smoking Status Former Smoker  . Packs/day: 2.00  . Years: 30.00  . Pack years: 60.00  . Types: Cigarettes  . Last attempt to quit: 04/08/2013  . Years since quitting: 4.6  Smokeless Tobacco Never Used    Goals Met:  Exercise tolerated well  Goals Unmet:  Not Applicable  Comments: Service time is from 10:30a to 12:40p    Dr. Rush Farmer is Medical Director for Pulmonary Rehab at [facility].

## 2017-12-04 ENCOUNTER — Encounter (HOSPITAL_COMMUNITY)
Admission: RE | Admit: 2017-12-04 | Discharge: 2017-12-04 | Disposition: A | Discharge status: Home / Self Care | Payer: No Typology Code available for payment source | Source: Ambulatory Visit | Attending: Pulmonary Disease | Admitting: Pulmonary Disease

## 2017-12-04 VITALS — Wt 239.4 lb

## 2017-12-04 DIAGNOSIS — J438 Other emphysema: Secondary | ICD-10-CM | POA: Diagnosis not present

## 2017-12-04 NOTE — Progress Notes (Signed)
Daily Session Note  Patient Details  Name: Vincent Sutton MRN: [id number] Date of Birth: [birth year] Referring Provider:     Pulmonary Rehab Walk Test from 09/25/2017 in [place]  Referring Provider  Dr. Nelda Marseille      Encounter Date: 12/04/2017  Check In: Session Check In - 12/04/17 1028      Check-In   Supervising physician immediately available to respond to emergencies  [facility] immediately available    Physician(s)  Dr. Herbert Moors    Location  [facility]    Staff Present  Hoy Register, MS, Exercise Physiologist;Lisa Ysidro Evert, Felipe Drone, RN, MHA;Samnang Shugars, MS, ACSM RCEP, Exercise Physiologist    Medication changes reported      No    Fall or balance concerns reported     No    Tobacco Cessation  No Change    Warm-up and Cool-down  Performed as group-led instruction    Resistance Training Performed  Yes    VAD Patient?  No    PAD/SET Patient?  No      Pain Assessment   Currently in Pain?  No/denies    Multiple Pain Sites  No       Capillary Blood Glucose: No results found for this or any previous visit (from the past 24 hour(s)).  Exercise Prescription Changes - 12/04/17 1200      Response to Exercise   Blood Pressure (Admit)  120/62    Blood Pressure (Exercise)  140/66    Blood Pressure (Exit)  150/80    Heart Rate (Admit)  93 bpm    Heart Rate (Exercise)  124 bpm    Heart Rate (Exit)  68 bpm    Oxygen Saturation (Admit)  98 %    Oxygen Saturation (Exercise)  94 %    Oxygen Saturation (Exit)  93 %    Rating of Perceived Exertion (Exercise)  11    Perceived Dyspnea (Exercise)  1    Duration  Progress to 45 minutes of aerobic exercise without signs/symptoms of physical distress    Intensity  THRR unchanged      Progression   Progression  Continue to progress workloads to maintain intensity without signs/symptoms of physical distress.      Resistance Training   Training Prescription   Yes    Weight  blue bands    Reps  10-15      Oxygen   Oxygen  Continuous    Liters  4      Treadmill   MPH  --    Grade  --    Minutes  --      Bike   Level  1.4    Minutes  10      NuStep   Level  4    SPM  80    Minutes  17       Social History   Tobacco Use  Smoking Status Former Smoker  . Packs/day: 2.00  . Years: 30.00  . Pack years: 60.00  . Types: Cigarettes  . Last attempt to quit: 04/08/2013  . Years since quitting: 4.6  Smokeless Tobacco Never Used    Goals Met:  Personal goals reviewed  Goals Unmet:  Not Applicable  Comments: Service time is from 10:30A to 12:00P    Dr. Rush Farmer is Medical Director for Pulmonary Rehab at [facility].

## 2017-12-05 ENCOUNTER — Encounter (HOSPITAL_COMMUNITY): Payer: Self-pay | Admitting: *Deleted

## 2017-12-06 ENCOUNTER — Encounter (HOSPITAL_COMMUNITY): Payer: No Typology Code available for payment source

## 2017-12-11 ENCOUNTER — Encounter (HOSPITAL_COMMUNITY): Payer: No Typology Code available for payment source

## 2017-12-13 ENCOUNTER — Encounter (HOSPITAL_COMMUNITY): Payer: No Typology Code available for payment source

## 2017-12-14 ENCOUNTER — Telehealth (HOSPITAL_COMMUNITY): Payer: Self-pay

## 2017-12-14 NOTE — Telephone Encounter (Signed)
Called and spoke with pt in regards to PR - scheduled orientation on 01/21/18 at 1:30pm. Pt will attend the 10;30am exc class. Mailed packet.

## 2017-12-18 ENCOUNTER — Encounter (HOSPITAL_COMMUNITY): Payer: No Typology Code available for payment source

## 2017-12-20 ENCOUNTER — Encounter (HOSPITAL_COMMUNITY): Payer: No Typology Code available for payment source

## 2017-12-25 ENCOUNTER — Encounter (HOSPITAL_COMMUNITY): Payer: Non-veteran care

## 2017-12-27 ENCOUNTER — Encounter (HOSPITAL_COMMUNITY): Payer: Non-veteran care

## 2018-01-01 ENCOUNTER — Encounter (HOSPITAL_COMMUNITY): Payer: Non-veteran care

## 2018-01-03 ENCOUNTER — Encounter (HOSPITAL_COMMUNITY): Payer: Non-veteran care

## 2018-01-15 ENCOUNTER — Telehealth (HOSPITAL_COMMUNITY): Payer: Self-pay

## 2018-01-18 NOTE — Addendum Note (Signed)
Encounter addended by: Ivonne Andrew, RD on: 01/18/2018 5:23 PM  Actions taken: Flowsheet data copied forward, Visit Navigator Flowsheet section accepted

## 2018-01-21 ENCOUNTER — Encounter (HOSPITAL_COMMUNITY): Payer: Self-pay

## 2018-01-21 ENCOUNTER — Encounter (HOSPITAL_COMMUNITY)
Admission: RE | Admit: 2018-01-21 | Discharge: 2018-01-21 | Disposition: A | Discharge status: Home / Self Care | Payer: No Typology Code available for payment source | Source: Ambulatory Visit | Attending: Pulmonary Disease | Admitting: Pulmonary Disease

## 2018-01-21 VITALS — BP 121/67 | HR 80 | Temp 97.7°F | Resp 20 | Ht 67.0 in | Wt 239.4 lb

## 2018-01-21 DIAGNOSIS — J449 Chronic obstructive pulmonary disease, unspecified: Secondary | ICD-10-CM

## 2018-01-21 DIAGNOSIS — J438 Other emphysema: Secondary | ICD-10-CM | POA: Insufficient documentation

## 2018-01-21 NOTE — Progress Notes (Signed)
Vincent Sutton 57 y.o. male Pulmonary Rehab Orientation Note Reynold referred to pulmonary rehab by Dr. Gwenette Greet at the [facility] for COPD,  arrived today in Cardiac and Pulmonary Rehab for orientation to Pulmonary Rehab.  Pt is well known to rehab staff. Pt has [facility] authorization # [id number] 12/05/17 - 04/04/2018 He arrived ambulatory with no complaints or distress. He does carry portable oxygen. Per pt, he uses oxygen continuously. Color good, skin warm and dry. Patient is oriented to time and place. Patient's medical history, psychosocial health, and medications reviewed. Psychosocial assessment reveals pt lives with their spouse and grandchildren. Pt is currently retired. Pt hobbies include doing activities and chores around the house.  Pt reports his stress level is moderate. Areas of stress/anxiety include Health Finances.  Pt exhibits signs of depression. Signs of depression include anxiety and panic and difficulty falling asleep, difficulty maintaining sleep, early morning awakenings and fatigue. PHQ2/9 score 1/11. Pt shows fair  coping skills with positive outlook . [place] offered emotional support and reassurance. Will continue to monitor and evaluate progress toward psychosocial goal(s) of less anxiety and stress regarding his financial situation.  Pt wife has been out of work for a long period of time due to Limes disease.  Pt is having to raise his grandchildren and money is very tight. Physical assessment reveals heart rate is normal, breath sounds  diminished. Grip strength equal, strong. Distal pulses palpable. Patient reports he does take medications as prescribed. Patient states he follows a Regular diet. The patient reports no specific efforts to gain or lose weight.. Patient's weight will be monitored closely. Demonstration and practice of PLB using pulse oximeter. Patient able to return demonstration satisfactorily. Safety and hand hygiene in the exercise area reviewed with patient. Patient  voices understanding of the information reviewed. Department expectations discussed with patient and achievable goals were set. The patient shows enthusiasm about attending the program and we look forward to working with this nice gentlemam. The patient is scheduled for a 6 min walk test on 12/3 and to begin exercise on 12/10 at 10:30. 45 minutes was spent on a variety of activities such as assessment of the patient, obtaining baseline data including height, weight, BMI, and grip strength, verifying medical history, allergies, and current medications, and teaching patient strategies for performing tasks with less respiratory effort with emphasis on pursed lip breathing. 1330-1400 Cherre Huger, BSN Cardiac and Training and development officer

## 2018-01-22 ENCOUNTER — Encounter (HOSPITAL_COMMUNITY)
Admission: RE | Admit: 2018-01-22 | Discharge: 2018-01-22 | Disposition: A | Discharge status: Home / Self Care | Payer: No Typology Code available for payment source | Source: Ambulatory Visit | Attending: Pulmonary Disease | Admitting: Pulmonary Disease

## 2018-01-22 DIAGNOSIS — J438 Other emphysema: Secondary | ICD-10-CM | POA: Diagnosis present

## 2018-01-22 DIAGNOSIS — J449 Chronic obstructive pulmonary disease, unspecified: Secondary | ICD-10-CM

## 2018-01-22 NOTE — Progress Notes (Signed)
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 (Exercise)  1    Duration  Progress to 45 minutes of aerobic exercise without signs/symptoms of physical distress    Intensity  THRR unchanged      Progression   Progression  Continue to progress workloads to maintain intensity without signs/symptoms of physical distress.  Resistance Training   Training Prescription  Yes    Weight  blue bands    Reps  10-15      Oxygen   Oxygen  Continuous    Liters  4      Treadmill   MPH  --    Grade  --    Minutes  --      Bike   Level  1.4    Minutes  10      NuStep   Level  4    SPM  80    Minutes  17       Nutrition:  Target Goals: Understanding of nutrition guidelines, daily intake of sodium <1556m, cholesterol <2049m calories 30% from fat and 7% or less from saturated fats, daily to have 5 or more servings of fruits and vegetables.  Biometrics:    Nutrition Therapy Plan and Nutrition Goals: Nutrition Therapy &  Goals - 09/25/17 1421      Nutrition Therapy   Diet  general healthful      Personal Nutrition Goals   Nutrition Goal  Identify food quantities necessary to achieve wt loss of  -2# per week to a goal wt loss of 2.7-10.9 kg (6-24 lb) at graduation from pulmonary rehab.      Intervention Plan   Intervention  Prescribe, educate and counsel regarding individualized specific dietary modifications aiming towards targeted core components such as weight, hypertension, lipid management, diabetes, heart failure and other comorbidities.    Expected Outcomes  Short Term Goal: Understand basic principles of dietary content, such as calories, fat, sodium, cholesterol and nutrients.       Nutrition Assessments: Nutrition Assessments - 12/14/17 1022      Rate Your Plate Scores   Pre Score  42    Post Score  24       Nutrition Goals Re-Evaluation: Nutrition Goals Re-Evaluation    Row Name 01/18/18 1723             Goals   Current Weight  239 lb 6.7 oz (108.6 kg)       Nutrition Goal  Identify food quantities necessary to achieve wt loss of  -2# per week to a goal wt loss of 2.7-10.9 kg (6-24 lb) at graduation from pulmonary rehab.       Comment  weight loss goal not met, continue to encourage weight loss          Nutrition Goals Discharge (Final Nutrition Goals Re-Evaluation): Nutrition Goals Re-Evaluation - 01/18/18 1723      Goals   Current Weight  239 lb 6.7 oz (108.6 kg)    Nutrition Goal  Identify food quantities necessary to achieve wt loss of  -2# per week to a goal wt loss of 2.7-10.9 kg (6-24 lb) at graduation from pulmonary rehab.    Comment  weight loss goal not met, continue to encourage weight loss       Psychosocial: Target Goals: Acknowledge presence or absence of significant depression and/or stress, maximize coping skills, provide positive support system. Participant is able to verbalize types and ability to use techniques and skills needed for reducing stress and  depression.  Initial Review & Psychosocial Screening: Initial Psych Review & Screening - 01/21/18 1357      Initial Review   Current issues with  Current Anxiety/Panic;Current Sleep Concerns      Family Dynamics   Good Support System?  Yes    Comments  lives with wife, two grand children  Barriers   Psychosocial barriers to participate in program  The patient should benefit from training in stress management and relaxation.      Screening Interventions   Interventions  Encouraged to exercise    Expected Outcomes  Long Term goal: The participant improves quality of Life and PHQ9 Scores as seen by post scores and/or verbalization of changes;Short Term goal: Identification and review with participant of any Quality of Life or Depression concerns found by scoring the questionnaire.;Short Term goal: Utilizing psychosocial counselor, staff and physician to assist with identification of specific Stressors or current issues interfering with healing process. Setting desired goal for each stressor or current issue identified.       Quality of Life Scores:  Scores of 19 and below usually indicate a poorer quality of life in these areas.  A difference of  2-3 points is a clinically meaningful difference.  A difference of 2-3 points in the total score of the Quality of Life Index has been associated with significant improvement in overall quality of life, self-image, physical symptoms, and general health in studies assessing change in quality of life.   PHQ-9: Recent Review Flowsheet Data    Depression screen [facility] 2/9 01/21/2018 12/04/2017 09/19/2017   Decreased Interest 0 0 0   Down, Depressed, Hopeless 1 0 1   PHQ - 2 Score 1 0 1   Altered sleeping 3 3 3    Tired, decreased energy 3 3 3    Change in appetite 1 0 0   Feeling bad or failure about yourself  1 1 1    Trouble concentrating 1 1 3    Moving slowly or fidgety/restless 1 1 1    Suicidal thoughts 0 0 1   PHQ-9 Score 11 9 13     Difficult doing work/chores Somewhat difficult Somewhat difficult Very difficult     Interpretation of Total Score  Total Score Depression Severity:  1-4 = Minimal depression, 5-9 = Mild depression, 10-14 = Moderate depression, 15-19 = Moderately severe depression, 20-27 = Severe depression   Psychosocial Evaluation and Intervention: Psychosocial Evaluation - 01/21/18 1358      Psychosocial Evaluation & Interventions   Interventions  Stress management education;Relaxation education;Encouraged to exercise with the program and follow exercise prescription    Comments  Pt receives therapy for PTSD, medication for anxiety.  Pt with brief hospital stay for paniac attacks.  continue to support pt in anyway    Expected Outcomes  Pt will demonstrate positive healthy coping skills        Psychosocial Re-Evaluation: Psychosocial Re-Evaluation    Row Name 10/09/17 0943 11/07/17 1346           Psychosocial Re-Evaluation   Current issues with  Current Sleep Concerns;Current Anxiety/Panic  Current Sleep Concerns;Current Anxiety/Panic      Comments  Pt has financial anxiety due to caring for two 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  With back to school supplies and clothing needed this is difficult for pt to manage  Pt has financial anxiety due to caring for two 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  With back to school supplies and clothing needed this is difficult for pt to manage      Expected Outcomes  Pt will develop strategies to deal with anxiety and usage of community resources to ease the financial burden.  Pt report improved sleep patterns.  Pt will develop strategies to deal with anxiety and usage of community resources to ease the financial burden.  Pt report improved sleep patterns.      Interventions  Stress management education;Relaxation education;Encouraged to attend Pulmonary Rehabilitation for the exercise  Stress management education;Relaxation education;Encouraged to attend Pulmonary Rehabilitation for the  exercise      Continue Psychosocial Services   Follow up required by staff  Follow up required by staff         Psychosocial Discharge (Final Psychosocial Re-Evaluation): Psychosocial Re-Evaluation - 11/07/17 1346      Psychosocial Re-Evaluation   Current issues with  Current Sleep Concerns;Current Anxiety/Panic    Comments  Pt has financial anxiety due to caring for two 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  With back to school supplies and clothing needed this is difficult for pt to manage    Expected Outcomes  Pt will develop strategies to deal with anxiety and usage of community resources to ease the financial burden.  Pt report improved sleep patterns.    Interventions  Stress management education;Relaxation education;Encouraged to attend Pulmonary Rehabilitation for the exercise    Continue Psychosocial Services   Follow up required by staff        Education: Education Goals: Education classes will be provided on a weekly basis, covering required topics. Participant will state understanding/return demonstration of topics presented.  Learning Barriers/Preferences: Learning Barriers/Preferences - 01/21/18 1358      Learning Barriers/Preferences   Learning Barriers  Hearing    Learning Preferences  Group Instruction;Individual Instruction;Written Material       Education Topics: How Lungs Work and Diseases: - Discuss the anatomy of the lungs and diseases that can affect the lungs, such as COPD.   Exercise: -Discuss the importance of exercise, FITT principles of exercise, normal and abnormal responses to exercise, and how to exercise safely.   Environmental Irritants: -Discuss types of environmental irritants and how to limit exposure to environmental irritants.   Meds/Inhalers and oxygen: - Discuss respiratory medications, definition of an inhaler and oxygen, and the proper way to use an inhaler and oxygen.   Energy Saving Techniques: - Discuss methods to conserve energy and  decrease shortness of breath when performing activities of daily living.    Bronchial Hygiene / Breathing Techniques: - Discuss breathing mechanics, pursed-lip breathing technique,  proper posture, effective ways to clear airways, and other functional breathing techniques   Cleaning Equipment: - Provides group verbal and written instruction about the health risks of elevated stress, cause of high stress, and healthy ways to reduce stress.   Nutrition I: Fats: - Discuss the types of cholesterol, what cholesterol does to the body, and how cholesterol levels can be controlled.   Nutrition II: Labels: -Discuss the different components of food labels and how to read food labels.   Respiratory Infections: - Discuss the signs and symptoms of respiratory infections, ways to prevent respiratory infections, and the importance of seeking medical treatment when having a respiratory infection.   Stress I: Signs and Symptoms: - Discuss the causes of stress, how stress may lead to anxiety and depression, and ways to limit stress.   Stress II: Relaxation: -Discuss relaxation techniques to limit stress.   Oxygen for Home/Travel: - Discuss how to prepare for travel when on oxygen and proper ways to transport and store oxygen to ensure safety.   Knowledge Questionnaire Score: Knowledge Questionnaire Score - 01/22/18 0857      Knowledge Questionnaire Score   Pre Score  15/18       Core Components/Risk Factors/Patient Goals at Admission: Personal Goals and Risk Factors at Admission - 01/21/18 1358      Core Components/Risk Factors/Patient Goals on Admission  Weight Management  Yes;Obesity    Intervention  Weight Management: Develop a combined nutrition and exercise program designed to reach desired caloric intake, while maintaining appropriate intake of nutrient and fiber, sodium and fats, and appropriate energy expenditure required for the weight goal.;Weight Management: Provide  education and appropriate resources to help participant work on and attain dietary goals.;Weight Management/Obesity: Establish reasonable short term and long term weight goals.;Obesity: Provide education and appropriate resources to help participant work on and attain dietary goals.    Admit Weight  238 lb 12.1 oz (108.3 kg)    Goal Weight: Short Term  218 lb (98.9 kg)    Goal Weight: Long Term  200 lb (90.7 kg)    Expected Outcomes  Short Term: Continue to assess and modify interventions until short term weight is achieved;Long Term: Adherence to nutrition and physical activity/exercise program aimed toward attainment of established weight goal;Weight Loss: Understanding of general recommendations for a balanced deficit meal plan, which promotes 1-2 lb weight loss per week and includes a negative energy balance of [phone] kcal/d;Understanding recommendations for meals to include 15-35% energy as protein, 25-35% energy from fat, 35-60% energy from carbohydrates, less than 296m of dietary cholesterol, 20-35 gm of total fiber daily;Understanding of distribution of calorie intake throughout the day with the consumption of 4-5 meals/snacks    Improve shortness of breath with ADL's  Yes    Intervention  Provide education, individualized exercise plan and daily activity instruction to help decrease symptoms of SOB with activities of daily living.    Expected Outcomes  Short Term: Improve cardiorespiratory fitness to achieve a reduction of symptoms when performing ADLs;Long Term: Be able to perform more ADLs without symptoms or delay the onset of symptoms       Core Components/Risk Factors/Patient Goals Review:  Goals and Risk Factor Review    Row Name 10/09/17 0948 10/10/17 0929 11/07/17 1348         Core Components/Risk Factors/Patient Goals Review   Personal Goals Review  Weight Management/Obesity;Improve shortness of breath with ADL's;Increase knowledge of respiratory medications and ability to use  respiratory devices properly.;Develop more efficient breathing techniques such as purse lipped breathing and diaphragmatic breathing and practicing self-pacing with activity.  Weight Management/Obesity;Improve shortness of breath with ADL's;Increase knowledge of respiratory medications and ability to use respiratory devices properly.;Develop more efficient breathing techniques such as purse lipped breathing and diaphragmatic breathing and practicing self-pacing with activity.  Weight Management/Obesity;Improve shortness of breath with ADL's;Increase knowledge of respiratory medications and ability to use respiratory devices properly.;Develop more efficient breathing techniques such as purse lipped breathing and diaphragmatic breathing and practicing self-pacing with activity.     Review  Pt has completed 2 exercise sessions since 8/13.  Unable to adquately assess pt progress toward pulmonary goals. I anticipate in the next 30 days pt will show progress toward pulmonary goals.  -  Pt has completed 10  exercise sessions since 8/13.  Pt is begining to interact with fellow participants however pt interacts well with staff and often is joking with them.  Pt has attended respiratory medication class.  Pt needs additonal coaching for proper breathing techniques.  Pt with minimal weight loss of .3kg.  Pt has not been able to engage in home exercise out outside [place]  Hopefully as pt becomes more conditioned he will be able to begin some exercise.  Pt with increase on treadmill to 2/4/4, airydyne .9 and nustep at level 5I  anticipate in the next 30 days pt will show progress toward pulmonary goals.     Expected Outcomes  See "admission goals"  See Admission Goals/Outcomes  See Admission Goals/Outcomes        Core Components/Risk Factors/Patient Goals at Discharge (Final Review):  Goals and Risk Factor Review - 11/07/17 1348      Core Components/Risk  Factors/Patient Goals Review   Personal Goals Review  Weight Management/Obesity;Improve shortness of breath with ADL's;Increase knowledge of respiratory medications and ability to use respiratory devices properly.;Develop more efficient breathing techniques such as purse lipped breathing and diaphragmatic breathing and practicing self-pacing with activity.    Review  Pt has completed 10  exercise sessions since 8/13.  Pt is begining to interact with fellow participants however pt interacts well with staff and often is joking with them.  Pt has attended respiratory medication class.  Pt needs additonal coaching for proper breathing techniques.  Pt with minimal weight loss of .3kg.  Pt has not been able to engage in home exercise out outside of rehab although he is [place] with his young grandchildren.  Hopefully as pt becomes more conditioned he will be able to begin some exercise.  Pt with increase on treadmill to 2/4/4, airydyne .9 and nustep at level 5I anticipate in the next 30 days pt will show progress toward pulmonary goals.    Expected Outcomes  See Admission Goals/Outcomes       ITP Comments: ITP Comments    Row Name 09/19/17 1358 10/10/17 0929 11/07/17 1345 01/21/18 1346     ITP Comments  Dr. Manfred Arch, Medical Director  Dr. Manfred Arch, Medical Director  Dr. Manfred Arch, Medical Director Pulmonary Rehab  Dr. Manfred Arch, Medical Director Pulmonary Rehab       Comments:

## 2018-01-29 ENCOUNTER — Encounter (HOSPITAL_COMMUNITY): Payer: No Typology Code available for payment source

## 2018-01-30 ENCOUNTER — Encounter (HOSPITAL_COMMUNITY): Payer: Self-pay

## 2018-01-30 NOTE — Progress Notes (Signed)
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 (Exercise)  1    Duration  Progress to 45 minutes of aerobic exercise without signs/symptoms of physical distress    Intensity  THRR unchanged      Progression   Progression  Continue to progress workloads to maintain intensity without signs/symptoms of physical distress.  Resistance Training   Training Prescription  Yes    Weight  blue bands    Reps  10-15      Oxygen   Oxygen  Continuous    Liters  4      Treadmill   MPH  --    Grade  --    Minutes  --      Bike   Level  1.4    Minutes  10      NuStep   Level  4    SPM  80    Minutes  17       Nutrition:  Target Goals: Understanding of nutrition guidelines, daily intake of sodium <1510m, cholesterol <2025m calories 30% from fat and 7% or less from saturated fats, daily to have 5 or more servings of fruits and vegetables.  Biometrics:    Nutrition Therapy Plan and Nutrition Goals: Nutrition Therapy &  Goals - 09/25/17 1421      Nutrition Therapy   Diet  general healthful      Personal Nutrition Goals   Nutrition Goal  Identify food quantities necessary to achieve wt loss of  -2# per week to a goal wt loss of 2.7-10.9 kg (6-24 lb) at graduation from pulmonary rehab.      Intervention Plan   Intervention  Prescribe, educate and counsel regarding individualized specific dietary modifications aiming towards targeted core components such as weight, hypertension, lipid management, diabetes, heart failure and other comorbidities.    Expected Outcomes  Short Term Goal: Understand basic principles of dietary content, such as calories, fat, sodium, cholesterol and nutrients.       Nutrition Assessments: Nutrition Assessments - 12/14/17 1022      Rate Your Plate Scores   Pre Score  42    Post Score  24       Nutrition Goals Re-Evaluation: Nutrition Goals Re-Evaluation    Row Name 01/18/18 1723             Goals   Current Weight  239 lb 6.7 oz (108.6 kg)       Nutrition Goal  Identify food quantities necessary to achieve wt loss of  -2# per week to a goal wt loss of 2.7-10.9 kg (6-24 lb) at graduation from pulmonary rehab.       Comment  weight loss goal not met, continue to encourage weight loss          Nutrition Goals Discharge (Final Nutrition Goals Re-Evaluation): Nutrition Goals Re-Evaluation - 01/18/18 1723      Goals   Current Weight  239 lb 6.7 oz (108.6 kg)    Nutrition Goal  Identify food quantities necessary to achieve wt loss of  -2# per week to a goal wt loss of 2.7-10.9 kg (6-24 lb) at graduation from pulmonary rehab.    Comment  weight loss goal not met, continue to encourage weight loss       Psychosocial: Target Goals: Acknowledge presence or absence of significant depression and/or stress, maximize coping skills, provide positive support system. Participant is able to verbalize types and ability to use techniques and skills needed for reducing stress and  depression.  Initial Review & Psychosocial Screening: Initial Psych Review & Screening - 01/21/18 1357      Initial Review   Current issues with  Current Anxiety/Panic;Current Sleep Concerns      Family Dynamics   Good Support System?  Yes    Comments  lives with wife, two grand children  Barriers   Psychosocial barriers to participate in program  The patient should benefit from training in stress management and relaxation.      Screening Interventions   Interventions  Encouraged to exercise    Expected Outcomes  Long Term goal: The participant improves quality of Life and PHQ9 Scores as seen by post scores and/or verbalization of changes;Short Term goal: Identification and review with participant of any Quality of Life or Depression concerns found by scoring the questionnaire.;Short Term goal: Utilizing psychosocial counselor, staff and physician to assist with identification of specific Stressors or current issues interfering with healing process. Setting desired goal for each stressor or current issue identified.       Quality of Life Scores:  Scores of 19 and below usually indicate a poorer quality of life in these areas.  A difference of  2-3 points is a clinically meaningful difference.  A difference of 2-3 points in the total score of the Quality of Life Index has been associated with significant improvement in overall quality of life, self-image, physical symptoms, and general health in studies assessing change in quality of life.  PHQ-9: Recent Review Flowsheet Data    Depression screen [facility] 2/9 01/21/2018 12/04/2017 09/19/2017   Decreased Interest 0 0 0   Down, Depressed, Hopeless 1 0 1   PHQ - 2 Score 1 0 1   Altered sleeping 3 3 3    Tired, decreased energy 3 3 3    Change in appetite 1 0 0   Feeling bad or failure about yourself  1 1 1    Trouble concentrating 1 1 3    Moving slowly or fidgety/restless 1 1 1    Suicidal thoughts 0 0 1   PHQ-9 Score 11 9 13    Difficult  doing work/chores Somewhat difficult Somewhat difficult Very difficult     Interpretation of Total Score  Total Score Depression Severity:  1-4 = Minimal depression, 5-9 = Mild depression, 10-14 = Moderate depression, 15-19 = Moderately severe depression, 20-27 = Severe depression   Psychosocial Evaluation and Intervention: Psychosocial Evaluation - 01/30/18 1658      Psychosocial Evaluation & Interventions   Interventions  Stress management education;Relaxation education;Encouraged to exercise with the program and follow exercise prescription    Comments  Pt receives therapy for PTSD, medication for anxiety.  Pt with brief hospital stay for paniac attacks.  continue to support pt in anyway    Expected Outcomes  Pt will demonstrate positive healthy coping skills     Continue Psychosocial Services   Follow up required by staff       Psychosocial Re-Evaluation: Psychosocial Re-Evaluation    [place] Name 10/09/17 0943 11/07/17 1346 01/30/18 1658         Psychosocial Re-Evaluation   Current issues with  Current Sleep Concerns;Current Anxiety/Panic  Current Sleep Concerns;Current Anxiety/Panic  Current Sleep Concerns;Current Anxiety/Panic     Comments  Pt has financial anxiety due to caring for two 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  With back to school supplies and clothing needed this is difficult for pt to manage  Pt has financial anxiety due to caring for two 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  With back to school supplies and clothing needed this is difficult for pt to manage  Pt has financial anxiety due to caring for two 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  With back to school supplies and clothing needed this is difficult for pt to manage     Expected Outcomes  Pt will develop strategies to deal with anxiety and usage of community  resources to ease the financial burden.  Pt report improved sleep patterns.  Pt will develop strategies to deal with anxiety and usage of community resources to ease the financial burden.  Pt report  improved sleep patterns.  Pt will develop strategies to deal with anxiety and usage of community resources to ease the financial burden.  Pt report improved sleep patterns.     Interventions  Stress management education;Relaxation education;Encouraged to attend Pulmonary Rehabilitation for the exercise  Stress management education;Relaxation education;Encouraged to attend Pulmonary Rehabilitation for the exercise  Stress management education;Relaxation education;Encouraged to attend Pulmonary Rehabilitation for the exercise     Continue Psychosocial Services   Follow up required by staff  Follow up required by staff  Follow up required by staff        Psychosocial Discharge (Final Psychosocial Re-Evaluation): Psychosocial Re-Evaluation - 01/30/18 1658      Psychosocial Re-Evaluation   Current issues with  Current Sleep Concerns;Current Anxiety/Panic    Comments  Pt has financial anxiety due to caring for two 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  With back to school supplies and clothing needed this is difficult for pt to manage    Expected Outcomes  Pt will develop strategies to deal with anxiety and usage of community resources to ease the financial burden.  Pt report improved sleep patterns.    Interventions  Stress management education;Relaxation education;Encouraged to attend Pulmonary Rehabilitation for the exercise    Continue Psychosocial Services   Follow up required by staff       Education: Education Goals: Education classes will be provided on a weekly basis, covering required topics. Participant will state understanding/return demonstration of topics presented.  Learning Barriers/Preferences: Learning Barriers/Preferences - 01/21/18 1358      Learning Barriers/Preferences   Learning Barriers  Hearing    Learning Preferences  Group Instruction;Individual Instruction;Written Material       Education Topics: Risk Factor Reduction:  -Group instruction that is supported by a PowerPoint  presentation. Instructor discusses the definition of a risk factor, different risk factors for pulmonary disease, and how the heart and lungs work together.     Nutrition for Pulmonary Patient:  -Group instruction provided by PowerPoint slides, verbal discussion, and written materials to support subject matter. The instructor gives an explanation and review of healthy diet recommendations, which includes a discussion on weight management, recommendations for fruit and vegetable consumption, as well as protein, fluid, caffeine, fiber, sodium, sugar, and alcohol. Tips for eating when patients are short of breath are discussed.   PULMONARY REHAB CHRONIC OBSTRUCTIVE PULMONARY DISEASE from 11/29/2017 in [place]  Date  10/18/17  Educator  Rodman Pickle  Instruction Review Code  2- Demonstrated Understanding      Pursed Lip Breathing:  -Group instruction that is supported by demonstration and informational handouts. Instructor discusses the benefits of pursed lip and diaphragmatic breathing and detailed demonstration on how to preform both.     Oxygen Safety:  -Group instruction provided by PowerPoint, verbal discussion, and written material to support subject matter. There is an overview of "What is Oxygen" and "Why do we need it".  Instructor also reviews how to create a safe environment for oxygen use, the importance of using oxygen as prescribed, and the risks of noncompliance. There is a brief discussion on traveling with oxygen and resources the patient may utilize.   PULMONARY REHAB CHRONIC OBSTRUCTIVE PULMONARY DISEASE from 11/29/2017 in [place]  Date  11/22/17  Educator  Cloyde Reams  Instruction Review Code  1- Verbalizes Understanding      Oxygen Equipment:  -Group instruction provided by [employer] utilizing handouts, written materials, and Insurance underwriter.   PULMONARY REHAB CHRONIC OBSTRUCTIVE PULMONARY DISEASE  from 11/29/2017 in [place]  Date  11/29/17  Educator  Ace Gins  Instruction Review Code  1- Verbalizes Understanding      Signs and Symptoms:  -Group instruction provided by written material and verbal discussion to support subject matter. Warning signs and symptoms of infection, stroke, and heart attack are reviewed and when to call the physician/911 reinforced. Tips for preventing the spread of infection discussed.   PULMONARY REHAB CHRONIC OBSTRUCTIVE PULMONARY DISEASE from 11/29/2017 in [place]  Date  11/15/17  Educator  RN  Instruction Review Code  2- Demonstrated Understanding      Advanced Directives:  -Group instruction provided by verbal instruction and written material to support subject matter. Instructor reviews Advanced Directive laws and proper instruction for filling out document.   Pulmonary Video:  -Group video education that reviews the importance of medication and oxygen compliance, exercise, good nutrition, pulmonary hygiene, and pursed lip and diaphragmatic breathing for the pulmonary patient.   Exercise for the Pulmonary Patient:  -Group instruction that is supported by a PowerPoint presentation. Instructor discusses benefits of exercise, core components of exercise, frequency, duration, and intensity of an exercise routine, importance of utilizing pulse oximetry during exercise, safety while exercising, and options of places to exercise outside of rehab.     Pulmonary Medications:  -Verbally interactive group education provided by instructor with focus on inhaled medications and proper administration.   PULMONARY REHAB CHRONIC OBSTRUCTIVE PULMONARY DISEASE from 11/29/2017 in [place]  Date  10/30/17  Educator  Pharm      Anatomy and Physiology of the Respiratory System and Intimacy:  -Group instruction provided by PowerPoint, verbal discussion, and written  material to support subject matter. Instructor reviews respiratory cycle and anatomical components of the respiratory system and their functions. Instructor also reviews differences in obstructive and restrictive respiratory diseases with examples of each. Intimacy, Sex, and Sexuality differences are reviewed with a discussion on how relationships can change when diagnosed with pulmonary disease. Common sexual concerns are reviewed.   PULMONARY REHAB CHRONIC OBSTRUCTIVE PULMONARY DISEASE from 11/29/2017 in [place]  Date  10/11/17  Educator  rn  Instruction Review Code  2- Demonstrated Understanding      MD DAY -A group question and answer session with a medical doctor that allows participants to ask questions that relate to their pulmonary disease state.   OTHER EDUCATION -Group or individual verbal, written, or video instructions that support the educational goals of the pulmonary rehab program.   Holiday Eating Survival Tips:  -Group instruction provided by PowerPoint slides, verbal discussion, and written materials to support subject matter. The instructor gives patients tips, tricks, and techniques to help them not only survive but enjoy the holidays despite the onslaught of food that accompanies the holidays.   Knowledge Questionnaire Score: Knowledge Questionnaire Score - 01/22/18 0857      Knowledge Questionnaire Score   Pre Score  15/18       Core Components/Risk Factors/Patient Goals at Admission: Personal Goals and Risk Factors at Admission - 01/21/18 1358      Core Components/Risk Factors/Patient Goals on Admission    Weight Management  Yes;Obesity    Intervention  Weight Management: Develop  a combined nutrition and exercise program designed to reach desired caloric intake, while maintaining appropriate intake of nutrient and fiber, sodium and fats, and appropriate energy expenditure required for the weight goal.;Weight Management:  Provide education and appropriate resources to help participant work on and attain dietary goals.;Weight Management/Obesity: Establish reasonable short term and long term weight goals.;Obesity: Provide education and appropriate resources to help participant work on and attain dietary goals.    Admit Weight  238 lb 12.1 oz (108.3 kg)    Goal Weight: Short Term  218 lb (98.9 kg)    Goal Weight: Long Term  200 lb (90.7 kg)    Expected Outcomes  Short Term: Continue to assess and modify interventions until short term weight is achieved;Long Term: Adherence to nutrition and physical activity/exercise program aimed toward attainment of established weight goal;Weight Loss: Understanding of general recommendations for a balanced deficit meal plan, which promotes 1-2 lb weight loss per week and includes a negative energy balance of [phone] kcal/d;Understanding recommendations for meals to include 15-35% energy as protein, 25-35% energy from fat, 35-60% energy from carbohydrates, less than 269m of dietary cholesterol, 20-35 gm of total fiber daily;Understanding of distribution of calorie intake throughout the day with the consumption of 4-5 meals/snacks    Improve shortness of breath with ADL's  Yes    Intervention  Provide education, individualized exercise plan and daily activity instruction to help decrease symptoms of SOB with activities of daily living.    Expected Outcomes  Short Term: Improve cardiorespiratory fitness to achieve a reduction of symptoms when performing ADLs;Long Term: Be able to perform more ADLs without symptoms or delay the onset of symptoms       Core Components/Risk Factors/Patient Goals Review:  Goals and Risk Factor Review    Row Name 10/09/17 0948 10/10/17 0929 11/07/17 1348 01/30/18 1658       Core Components/Risk Factors/Patient Goals Review   Personal Goals Review  Weight Management/Obesity;Improve shortness of breath with ADL's;Increase knowledge of respiratory medications  and ability to use respiratory devices properly.;Develop more efficient breathing techniques such as purse lipped breathing and diaphragmatic breathing and practicing self-pacing with activity.  Weight Management/Obesity;Improve shortness of breath with ADL's;Increase knowledge of respiratory medications and ability to use respiratory devices properly.;Develop more efficient breathing techniques such as purse lipped breathing and diaphragmatic breathing and practicing self-pacing with activity.  Weight Management/Obesity;Improve shortness of breath with ADL's;Increase knowledge of respiratory medications and ability to use respiratory devices properly.;Develop more efficient breathing techniques such as purse lipped breathing and diaphragmatic breathing and practicing self-pacing with activity.  Weight Management/Obesity;Improve shortness of breath with ADL's;Increase knowledge of respiratory medications and ability to use respiratory devices properly.;Develop more efficient breathing techniques such as purse lipped breathing and diaphragmatic breathing and practicing self-pacing with activity.    Review  Pt has completed 2 exercise sessions since 8/13.  Unable to adquately assess pt progress toward pulmonary goals. I anticipate in the next 30 days pt will show progress toward pulmonary goals.  -  Pt has completed 10  exercise sessions since 8/13.  Pt is begining to interact with fellow participants however pt interacts well with staff and often is joking with them.  Pt has attended respiratory medication class.  Pt needs additonal coaching for proper breathing techniques.  Pt with minimal weight loss of .3kg.  Pt has not been able to engage in home exercise out outside of rehab [place]  Hopefully as pt becomes more conditioned  he will be able to begin some exercise.  Pt with increase on treadmill to 2/4/4, airydyne .9 and nustep at level 5I anticipate in the next 30  days pt will show progress toward pulmonary goals.  Pt to begin pulmonary rehab on 12/12.      Expected Outcomes  See "admission goals"  See Admission Goals/Outcomes  See Admission Goals/Outcomes  See Admission Goals/Outcomes       Core Components/Risk Factors/Patient Goals at Discharge (Final Review):  Goals and Risk Factor Review - 01/30/18 1658      Core Components/Risk Factors/Patient Goals Review   Personal Goals Review  Weight Management/Obesity;Improve shortness of breath with ADL's;Increase knowledge of respiratory medications and ability to use respiratory devices properly.;Develop more efficient breathing techniques such as purse lipped breathing and diaphragmatic breathing and practicing self-pacing with activity.    Review  Pt to begin pulmonary rehab on 12/12.      Expected Outcomes  See Admission Goals/Outcomes       ITP Comments: ITP Comments    Row Name 09/19/17 1358 10/10/17 0929 11/07/17 1345 01/21/18 1346 01/30/18 1658   ITP Comments  Dr. Manfred Arch, Medical Director  Dr. Manfred Arch, Medical Director  Dr. Manfred Arch, Medical Director Pulmonary Rehab  Dr. Manfred Arch, Medical Director Pulmonary Rehab  Dr. Manfred Arch, Medical Director Pulmonary Rehab      Comments: Pt to begin exercise on 12/12 Maurice Small RN, BSN Cardiac and Pulmonary Rehab Nurse Navigator

## 2018-01-31 ENCOUNTER — Encounter (HOSPITAL_COMMUNITY): Payer: No Typology Code available for payment source

## 2018-02-05 ENCOUNTER — Encounter (HOSPITAL_COMMUNITY)
Admission: RE | Admit: 2018-02-05 | Discharge: 2018-02-05 | Disposition: A | Discharge status: Home / Self Care | Payer: No Typology Code available for payment source | Source: Ambulatory Visit | Attending: Pulmonary Disease | Admitting: Pulmonary Disease

## 2018-02-05 DIAGNOSIS — J449 Chronic obstructive pulmonary disease, unspecified: Secondary | ICD-10-CM

## 2018-02-05 DIAGNOSIS — J438 Other emphysema: Secondary | ICD-10-CM | POA: Diagnosis not present

## 2018-02-05 NOTE — Progress Notes (Signed)
Daily Session Note  Patient Details  Name: Vincent Sutton MRN: [id number] Date of Birth: [birth year] Referring Provider:     Pulmonary Rehab Walk Test from 01/22/2018 in [place]  Referring Provider  Dr. Nelda Marseille      Encounter Date: 02/05/2018  Check In: Session Check In - 02/05/18 1026      Check-In   Supervising physician immediately available to respond to emergencies  See telemetry face sheet for immediately available MD    Physician(s)  Dr. Nevada Crane    Location  [facility]    Staff Present  Joycelyn Man RN, BSN;Dalton Kris Mouton, MS, Exercise Physiologist;Janalynn Eder Leonia Reeves, RN, BSN;Carlette Wilber Oliphant, RN, Roque Cash, RN    Medication changes reported      No    Fall or balance concerns reported     No    Tobacco Cessation  No Change    Warm-up and Cool-down  Performed as group-led instruction    Resistance Training Performed  Yes    VAD Patient?  No    PAD/SET Patient?  No      Pain Assessment   Currently in Pain?  No/denies    Multiple Pain Sites  No       Capillary Blood Glucose: No results found for this or any previous visit (from the past 24 hour(s)).    Social History   Tobacco Use  Smoking Status Former Smoker  . Packs/day: 2.00  . Years: 30.00  . Pack years: 60.00  . Types: Cigarettes  . Last attempt to quit: 04/08/2013  . Years since quitting: 4.8  Smokeless Tobacco Never Used    Goals Met:  Exercise tolerated well Strength training completed today  Goals Unmet:  Not Applicable  Comments: Service time is from 1030 to 1210    Dr. Rush Farmer is Medical Director for Pulmonary Rehab at [facility]

## 2018-02-07 ENCOUNTER — Encounter (HOSPITAL_COMMUNITY)
Admission: RE | Admit: 2018-02-07 | Discharge: 2018-02-07 | Disposition: A | Discharge status: Home / Self Care | Payer: No Typology Code available for payment source | Source: Ambulatory Visit | Attending: Pulmonary Disease | Admitting: Pulmonary Disease

## 2018-02-07 DIAGNOSIS — J438 Other emphysema: Secondary | ICD-10-CM | POA: Diagnosis not present

## 2018-02-07 DIAGNOSIS — J449 Chronic obstructive pulmonary disease, unspecified: Secondary | ICD-10-CM

## 2018-02-07 NOTE — Progress Notes (Signed)
Daily Session Note  Patient Details  Name: Vincent Sutton MRN: [id number] Date of Birth: 22-Mar-1960 Referring Provider:     Pulmonary Rehab Walk Test from 01/22/2018 in [place]  Referring Provider  Dr. Nelda Marseille      Encounter Date: 02/07/2018  Check In: Session Check In - 02/07/18 1102      Check-In   Supervising physician immediately available to respond to emergencies  [facility] immediately available    Physician(s)  Dr. Karleen Hampshire    Location  [facility]    Staff Present  Joycelyn Man RN, BSN;Dalton Kris Mouton, MS, Exercise Physiologist;Joan Leonia Reeves, RN, BSN;Carlette Wilber Oliphant, RN, Roque Cash, RN    Medication changes reported      No    Fall or balance concerns reported     No    Tobacco Cessation  No Change    Warm-up and Cool-down  Performed as group-led instruction    Resistance Training Performed  Yes    VAD Patient?  No    PAD/SET Patient?  No      Pain Assessment   Currently in Pain?  No/denies    Pain Score  0-No pain    Multiple Pain Sites  No       Capillary Blood Glucose: No results found for this or any previous visit (from the past 24 hour(s)).    Social History   Tobacco Use  Smoking Status Former Smoker  . Packs/day: 2.00  . Years: 30.00  . Pack years: 60.00  . Types: Cigarettes  . Last attempt to quit: 04/08/2013  . Years since quitting: 4.8  Smokeless Tobacco Never Used    Goals Met:  Proper associated with RPD/PD & O2 Sat Exercise tolerated well Strength training completed today  Goals Unmet:  Not Applicable  Comments: Service time is from 1030 to 1215   Dr. Rush Farmer is Medical Director for Pulmonary Rehab at [facility].

## 2018-02-12 ENCOUNTER — Encounter (HOSPITAL_COMMUNITY): Payer: No Typology Code available for payment source

## 2018-02-14 ENCOUNTER — Encounter (HOSPITAL_COMMUNITY): Payer: No Typology Code available for payment source

## 2018-02-17 NOTE — Progress Notes (Signed)
Discharge Progress Report  Patient Details  Name: Vincent Sutton MRN: [id number] Date of Birth: 1960/11/21 Referring Provider:     Pulmonary Rehab Walk Test from 09/25/2017 in [place]  Referring Provider  Dr. Nelda Marseille       Number of Visits: 17 exercise sessions and 8 education classes.  Reason for Discharge:  Patient reached a stable level of exercise. Patient independent in their exercise.  Smoking History:  Social History   Tobacco Use  Smoking Status Former Smoker  . Packs/day: 2.00  . Years: 30.00  . Pack years: 60.00  . Types: Cigarettes  . Last attempt to quit: 04/08/2013  . Years since quitting: 4.8  Smokeless Tobacco Never Used    Diagnosis:  Other emphysema ([place])  ADL [facility]: Pulmonary Assessment Scores    Row Name 12/05/17 0901 12/06/17 0902 01/22/18 0856     ADL [facility]   ADL Phase  Exit  Exit  Entry   SOB Score total  101  -  107     CAT Score   CAT Score  14  -  pre 33     mMRC Score   mMRC Score  -  2  -   Row Name 01/22/18 1338         ADL [facility]   ADL Phase  Entry       mMRC Score   mMRC Score  4        Initial Exercise Prescription: Initial Exercise Prescription - 01/22/18 1300      Date of Initial Exercise RX and Referring Provider   Date  01/22/18    Referring Provider  Dr. Nelda Marseille      Oxygen   Oxygen  Continuous    Liters  4      Treadmill   MPH  1.8    Grade  2    Minutes  17      Bike   Level  0.8    Minutes  17      NuStep   Level  3    SPM  80    Minutes  17      Prescription Details   Frequency (times per week)  2    Duration  Progress to 45 minutes of aerobic exercise without signs/symptoms of physical distress      Intensity   THRR 40-80% of Max Heartrate  65-131    Ratings of Perceived Exertion  11-13    Perceived Dyspnea  0-4      Progression   Progression  Continue to progress workloads to maintain intensity without signs/symptoms of physical distress.      Resistance  Training   Training Prescription  Yes    Weight  blue bands    Reps  10-15       Discharge Exercise Prescription (Final Exercise Prescription Changes): Exercise Prescription Changes - 02/07/18 1237      Response to Exercise   Blood Pressure (Admit)  146/90    Blood Pressure (Exercise)  152/60    Blood Pressure (Exit)  110/66    Heart Rate (Admit)  93 bpm    Heart Rate (Exercise)  119 bpm    Heart Rate (Exit)  110 bpm    Oxygen Saturation (Admit)  97 %    Oxygen Saturation (Exercise)  90 %    Oxygen Saturation (Exit)  95 %    Rating of Perceived Exertion (Exercise)  11    Perceived Dyspnea (Exercise)  1    Duration  Progress to 45 minutes of aerobic exercise without signs/symptoms of physical distress    Intensity  Other (comment)   40-80 % of HRR     Progression   Progression  Continue to progress workloads to maintain intensity without signs/symptoms of physical distress.      Resistance Training   Training Prescription  Yes    Weight  blue bands    Reps  10-15    Time  10 Minutes      Oxygen   Oxygen  Continuous    Liters  4      Treadmill   MPH  1.8    Grade  2    Minutes  17      NuStep   Level  4    SPM  80    Minutes  17       Functional Capacity: 6 Minute Walk    Row Name 09/27/17 0820 12/06/17 0859 01/22/18 1339     6 Minute Walk   Phase  Initial  Initial  Initial   Distance  1138 feet  1520 feet  1316 feet   Distance Feet Change  -  382 ft  -   Walk Time  6 minutes  6 minutes  6 minutes   # of Rest Breaks  0  0  0   MPH  2.15  2.87  2.49   METS  2.61  3.22  2.91   RPE  11  13  12    Perceived Dyspnea   3  2  2    Symptoms  Yes (comment)  Yes (comment)  No   Comments  used wheelchair  used wheelchair  -   Resting HR  89 bpm  98 bpm  82 bpm   Resting BP  112/70  120/60  126/68   Resting Oxygen Saturation   96 %  95 %  98 %   Exercise Oxygen Saturation  during 6 min walk  93 %  87 %  89 %   Max Ex. HR  101 bpm  1120 bpm  111 bpm   Max Ex. BP   110/64  150/80  150/80   2 Minute Post BP  -  -  130/80     Interval HR   1 Minute HR  94  107  89   2 Minute HR  96  112  111   3 Minute HR  101  113  106   4 Minute HR  101  113  105   5 Minute HR  101  112  105   6 Minute HR  100  -  107   2 Minute Post HR  -  -  91   Interval Heart Rate?  Yes  Yes  Yes     Interval Oxygen   Interval Oxygen?  Yes  Yes  Yes   Baseline Oxygen Saturation %  96 %  95 %  98 %   1 Minute Oxygen Saturation %  96 %  95 %  94 %   1 Minute Liters of Oxygen  4 L  4 L  4 L   2 Minute Oxygen Saturation %  94 %  89 %  91 %   2 Minute Liters of Oxygen  4 L  4 L  4 L   3 Minute Oxygen Saturation %  93 %  87 %  91 %   3 Minute  Liters of Oxygen  4 L  4 L  4 L   4 Minute Oxygen Saturation %  94 %  87 %  90 %   4 Minute Liters of Oxygen  4 L  4 L  4 L   5 Minute Oxygen Saturation %  93 %  87 %  89 %   5 Minute Liters of Oxygen  4 L  4 L  4 L   6 Minute Oxygen Saturation %  94 %  -  89 %   6 Minute Liters of Oxygen  4 L  -  4 L   2 Minute Post Oxygen Saturation %  -  95 %  98 %   2 Minute Post Liters of Oxygen  -  120 L  4 L      Psychological, QOL, Others - Outcomes: PHQ 2/9: Depression screen [facility] 2/9 01/21/2018 12/04/2017 09/19/2017  Decreased Interest 0 0 0  Down, Depressed, Hopeless 1 0 1  PHQ - 2 Score 1 0 1  Altered sleeping 3 3 3   Tired, decreased energy 3 3 3   Change in appetite 1 0 0  Feeling bad or failure about yourself  1 1 1   Trouble concentrating 1 1 3   Moving slowly or fidgety/restless 1 1 1   Suicidal thoughts 0 0 1  PHQ-9 Score 11 9 13   Difficult doing work/chores Somewhat difficult Somewhat difficult Very difficult    Quality of Life:   Personal Goals: Goals established at orientation with interventions provided to work toward goal. Personal Goals and Risk Factors at Admission - 01/21/18 1358      Core Components/Risk Factors/Patient Goals on Admission    Weight Management  Yes;Obesity    Intervention  Weight Management:  Develop a combined nutrition and exercise program designed to reach desired caloric intake, while maintaining appropriate intake of nutrient and fiber, sodium and fats, and appropriate energy expenditure required for the weight goal.;Weight Management: Provide education and appropriate resources to help participant work on and attain dietary goals.;Weight Management/Obesity: Establish reasonable short term and long term weight goals.;Obesity: Provide education and appropriate resources to help participant work on and attain dietary goals.    Admit Weight  238 lb 12.1 oz (108.3 kg)    Goal Weight: Short Term  218 lb (98.9 kg)    Goal Weight: Long Term  200 lb (90.7 kg)    Expected Outcomes  Short Term: Continue to assess and modify interventions until short term weight is achieved;Long Term: Adherence to nutrition and physical activity/exercise program aimed toward attainment of established weight goal;Weight Loss: Understanding of general recommendations for a balanced deficit meal plan, which promotes 1-2 lb weight loss per week and includes a negative energy balance of [phone] kcal/d;Understanding recommendations for meals to include 15-35% energy as protein, 25-35% energy from fat, 35-60% energy from carbohydrates, less than 2100m of dietary cholesterol, 20-35 gm of total fiber daily;Understanding of distribution of calorie intake throughout the day with the consumption of 4-5 meals/snacks    Improve shortness of breath with ADL's  Yes    Intervention  Provide education, individualized exercise plan and daily activity instruction to help decrease symptoms of SOB with activities of daily living.    Expected Outcomes  Short Term: Improve cardiorespiratory fitness to achieve a reduction of symptoms when performing ADLs;Long Term: Be able to perform more ADLs without symptoms or delay the onset of symptoms        Personal Goals Discharge: Goals and Risk  Factor Review    Row Name 10/09/17 2947 10/10/17  0929 11/07/17 1348 01/30/18 1658       Core Components/Risk Factors/Patient Goals Review   Personal Goals Review  Weight Management/Obesity;Improve shortness of breath with ADL's;Increase knowledge of respiratory medications and ability to use respiratory devices properly.;Develop more efficient breathing techniques such as purse lipped breathing and diaphragmatic breathing and practicing self-pacing with activity.  Weight Management/Obesity;Improve shortness of breath with ADL's;Increase knowledge of respiratory medications and ability to use respiratory devices properly.;Develop more efficient breathing techniques such as purse lipped breathing and diaphragmatic breathing and practicing self-pacing with activity.  Weight Management/Obesity;Improve shortness of breath with ADL's;Increase knowledge of respiratory medications and ability to use respiratory devices properly.;Develop more efficient breathing techniques such as purse lipped breathing and diaphragmatic breathing and practicing self-pacing with activity.  Weight Management/Obesity;Improve shortness of breath with ADL's;Increase knowledge of respiratory medications and ability to use respiratory devices properly.;Develop more efficient breathing techniques such as purse lipped breathing and diaphragmatic breathing and practicing self-pacing with activity.    Review  Pt has completed 2 exercise sessions since 8/13.  Unable to adquately assess pt progress toward pulmonary goals. I anticipate in the next 30 days pt will show progress toward pulmonary goals.  -  Pt has completed 10  exercise sessions since 8/13.  Pt is begining to interact with fellow participants however pt interacts well with staff and often is joking with them.  Pt has attended respiratory medication class.  Pt needs additonal coaching for proper breathing techniques.  Pt with minimal weight loss of .3kg.  Pt has not been able to engage in home exercise out outside of rehab although he  is [place] with his young grandchildren.  Hopefully as pt becomes more conditioned he will be able to begin some exercise.  Pt with increase on treadmill to 2/4/4, airydyne .9 and nustep at level 5I anticipate in the next 30 days pt will show progress toward pulmonary goals.  Pt to begin pulmonary rehab on 12/12.      Expected Outcomes  See "admission goals"  See Admission Goals/Outcomes  See Admission Goals/Outcomes  See Admission Goals/Outcomes       Exercise Goals and Review: Exercise Goals    Row Name 09/19/17 1423 01/21/18 1355           Exercise Goals   Increase Physical Activity  Yes  Yes      Intervention  Provide advice, education, support and counseling about physical activity/exercise needs.;Develop an individualized exercise prescription for aerobic and resistive training based on initial evaluation findings, risk stratification, comorbidities and participant's personal goals.  Provide advice, education, support and counseling about physical activity/exercise needs.;Develop an individualized exercise prescription for aerobic and resistive training based on initial evaluation findings, risk stratification, comorbidities and participant's personal goals.      Expected Outcomes  Short Term: Attend rehab on a regular basis to increase amount of physical activity.;Long Term: Add in home exercise to make exercise part of routine and to increase amount of physical activity.;Long Term: Exercising regularly at least 3-5 days a week.  Short Term: Attend rehab on a regular basis to increase amount of physical activity.;Long Term: Add in home exercise to make exercise part of routine and to increase amount of physical activity.;Long Term: Exercising regularly at least 3-5 days a week.      Increase Strength and Stamina  Yes  Yes      Intervention  Provide advice, education, support and counseling about physical activity/exercise  needs.;Develop an individualized exercise prescription for aerobic and  resistive training based on initial evaluation findings, risk stratification, comorbidities and participant's personal goals.  Provide advice, education, support and counseling about physical activity/exercise needs.;Develop an individualized exercise prescription for aerobic and resistive training based on initial evaluation findings, risk stratification, comorbidities and participant's personal goals.      Expected Outcomes  Short Term: Increase workloads from initial exercise prescription for resistance, speed, and METs.;Short Term: Perform resistance training exercises routinely during rehab and add in resistance training at home;Long Term: Improve cardiorespiratory fitness, muscular endurance and strength as measured by increased METs and functional capacity (6MWT)  Short Term: Increase workloads from initial exercise prescription for resistance, speed, and METs.;Short Term: Perform resistance training exercises routinely during rehab and add in resistance training at home;Long Term: Improve cardiorespiratory fitness, muscular endurance and strength as measured by increased METs and functional capacity (6MWT)      Able to understand and use rate of perceived exertion (RPE) scale  Yes  Yes      Intervention  Provide education and explanation on how to use RPE scale  Provide education and explanation on how to use RPE scale      Expected Outcomes  Short Term: Able to use RPE daily in rehab to express subjective intensity level;Long Term:  Able to use RPE to guide intensity level when exercising independently  Short Term: Able to use RPE daily in rehab to express subjective intensity level;Long Term:  Able to use RPE to guide intensity level when exercising independently      Able to understand and use Dyspnea scale  Yes  Yes      Intervention  Provide education and explanation on how to use Dyspnea scale  Provide education and explanation on how to use Dyspnea scale      Expected Outcomes  Short Term: Able  to use Dyspnea scale daily in rehab to express subjective sense of shortness of breath during exertion;Long Term: Able to use Dyspnea scale to guide intensity level when exercising independently  Short Term: Able to use Dyspnea scale daily in rehab to express subjective sense of shortness of breath during exertion;Long Term: Able to use Dyspnea scale to guide intensity level when exercising independently      Knowledge and understanding of Target Heart Rate Range (THRR)  Yes  Yes      Intervention  Provide education and explanation of THRR including how the numbers were predicted and where they are located for reference  Provide education and explanation of THRR including how the numbers were predicted and where they are located for reference      Expected Outcomes  Short Term: Able to state/look up THRR;Long Term: Able to use THRR to govern intensity when exercising independently  Short Term: Able to state/look up THRR;Long Term: Able to use THRR to govern intensity when exercising independently;Short Term: Able to use daily as guideline for intensity in rehab      Understanding of Exercise Prescription  Yes  Yes      Intervention  Provide education, explanation, and written materials on patient's individual exercise prescription  Provide education, explanation, and written materials on patient's individual exercise prescription      Expected Outcomes  Short Term: Able to explain program exercise prescription;Long Term: Able to explain home exercise prescription to exercise independently  Short Term: Able to explain program exercise prescription;Long Term: Able to explain home exercise prescription to exercise independently  Exercise Goals Re-Evaluation: Exercise Goals Re-Evaluation    Row Name 10/08/17 1209 11/05/17 1627 12/04/17 1020         Exercise Goal Re-Evaluation   Exercise Goals Review  Increase Physical Activity;Increase Strength and Stamina;Able to understand and use rate of  perceived exertion (RPE) scale;Able to understand and use Dyspnea scale;Knowledge and understanding of Target Heart Rate Range (THRR);Understanding of Exercise Prescription  Increase Physical Activity;Increase Strength and Stamina;Able to understand and use rate of perceived exertion (RPE) scale;Able to understand and use Dyspnea scale;Knowledge and understanding of Target Heart Rate Range (THRR);Understanding of Exercise Prescription  Increase Physical Activity;Increase Strength and Stamina;Able to understand and use rate of perceived exertion (RPE) scale;Able to understand and use Dyspnea scale;Knowledge and understanding of Target Heart Rate Range (THRR);Understanding of Exercise Prescription     Comments  Patient has only attended 2 rehab sessions. Will cont. to monitor and motivate.  Patient is progressing well in rehab. His big goal is weight loss. Is active at home. Needs to concentrate on diet. Is open to workload changes.. Sometimes does desat-already on 4 liters (COPD). Will cont to monitor and motivate as able.   Patient is progressing well in rehab. His big goal is weight loss. Is active at home. Needs to concentrate on diet. Is open to workload changes.. Sometimes does desat-already on 4 liters (COPD). Will graduate from rehab today.      Expected Outcomes  Through exercise at home and at rehab, patient will increase physical capacity and ADL's will become easier to perform.   Through exercise at home and at rehab, patient will increase physical capacity and ADL's will become easier to perform.   Through exercise at home and at rehab, patient will increase physical capacity and ADL's will become easier to perform.         Nutrition & Weight - Outcomes:    Nutrition: Nutrition Therapy & Goals - 09/25/17 1421      Nutrition Therapy   Diet  general healthful      Personal Nutrition Goals   Nutrition Goal  Identify food quantities necessary to achieve wt loss of  -2# per week to a goal wt  loss of 2.7-10.9 kg (6-24 lb) at graduation from pulmonary rehab.      Intervention Plan   Intervention  Prescribe, educate and counsel regarding individualized specific dietary modifications aiming towards targeted core components such as weight, hypertension, lipid management, diabetes, heart failure and other comorbidities.    Expected Outcomes  Short Term Goal: Understand basic principles of dietary content, such as calories, fat, sodium, cholesterol and nutrients.       Nutrition Discharge: Nutrition Assessments - 12/14/17 1022      Rate Your Plate Scores   Pre Score  42    Post Score  24       Education Questionnaire Score: Knowledge Questionnaire Score - 01/22/18 0857      Knowledge Questionnaire Score   Pre Score  15/18       Goals reviewed with patient.  Cherre Huger, BSN Cardiac and Training and development officer

## 2018-02-17 NOTE — Addendum Note (Signed)
Encounter addended by: Rowe Pavy, RN on: 02/17/2018 9:26 PM  Actions taken: Visit Navigator Flowsheet section accepted, Clinical Note Signed
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
## 2018-02-18 NOTE — Addendum Note (Signed)
Encounter addended by: Ivonne Andrew, RD on: 02/18/2018 3:16 PM  Actions taken: Clinical Note Signed, Flowsheet data copied forward, Visit Navigator Flowsheet section accepted

## 2018-02-19 ENCOUNTER — Encounter (HOSPITAL_COMMUNITY)
Admission: RE | Admit: 2018-02-19 | Discharge: 2018-02-19 | Disposition: A | Discharge status: Home / Self Care | Payer: No Typology Code available for payment source | Source: Ambulatory Visit | Attending: Pulmonary Disease | Admitting: Pulmonary Disease

## 2018-02-19 DIAGNOSIS — J449 Chronic obstructive pulmonary disease, unspecified: Secondary | ICD-10-CM

## 2018-02-19 DIAGNOSIS — J438 Other emphysema: Secondary | ICD-10-CM | POA: Diagnosis not present

## 2018-02-19 NOTE — Progress Notes (Signed)
I have reviewed a Home Exercise Prescription with Vincent Sutton . [name] is  currently exercising at home.  The patient was advised to walk on the treadmill and use weight machines 3 days a week for 30-45 minutes.  [name] and I discussed how to progress their exercise prescription.  The patient stated that their goals were to lose weight.  The patient stated that they understand the exercise prescription.  We reviewed exercise guidelines, target heart rate during exercise, RPE Scale, weather conditions, NTG use, endpoints for exercise, warmup and cool down.  Patient is encouraged to come to me with any questions. I will continue to follow up with the patient to assist them with progression and safety.

## 2018-02-19 NOTE — Progress Notes (Signed)
Daily Session Note  Patient Details  Name: Vincent Sutton MRN: [id number] Date of Birth: 11-Feb-1961 Referring Provider:     Pulmonary Rehab Walk Test from 01/22/2018 in [place]  Referring Provider  Dr. Nelda Marseille      Encounter Date: 02/19/2018  Check In: Session Check In - 02/19/18 1210      Check-In   Supervising physician immediately available to respond to emergencies  [facility] immediately available    Physician(s)  Dr. Nevada Crane    Location  [facility]    Staff Present  Rodney Langton, RN;Taisia Fantini Jiles Crocker RN, BSN;Dalton Kris Mouton, MS, Exercise Physiologist;Joan Leonia Reeves, RN, BSN    Medication changes reported      No    Fall or balance concerns reported     No    Tobacco Cessation  No Change    Warm-up and Cool-down  Performed as group-led instruction    Resistance Training Performed  Yes    VAD Patient?  No    PAD/SET Patient?  No      Pain Assessment   Currently in Pain?  No/denies    Pain Score  0-No pain    Multiple Pain Sites  No       Capillary Blood Glucose: No results found for this or any previous visit (from the past 24 hour(s)).  Exercise Prescription Changes - 02/19/18 1200      Home Exercise Plan   Plans to continue exercise at  Home (comment)    Frequency  Add 3 additional days to program exercise sessions.    Initial Home Exercises Provided  02/19/18       Social History   Tobacco Use  Smoking Status Former Smoker  . Packs/day: 2.00  . Years: 30.00  . Pack years: 60.00  . Types: Cigarettes  . Last attempt to quit: 04/08/2013  . Years since quitting: 4.8  Smokeless Tobacco Never Used    Goals Met:  Proper associated with RPD/PD & O2 Sat Exercise tolerated well Strength training completed today  Goals Unmet:  Not Applicable  Comments: Service time is from 1030 to 1200   Dr. Rush Farmer is Medical Director for Pulmonary Rehab at [facility]

## 2018-02-21 ENCOUNTER — Encounter (HOSPITAL_COMMUNITY)
Admission: RE | Admit: 2018-02-21 | Discharge: 2018-02-21 | Disposition: A | Discharge status: Home / Self Care | Payer: No Typology Code available for payment source | Source: Ambulatory Visit | Attending: Pulmonary Disease | Admitting: Pulmonary Disease

## 2018-02-21 VITALS — Ht 67.0 in | Wt 240.7 lb

## 2018-02-21 DIAGNOSIS — J438 Other emphysema: Secondary | ICD-10-CM | POA: Diagnosis not present

## 2018-02-21 DIAGNOSIS — J449 Chronic obstructive pulmonary disease, unspecified: Secondary | ICD-10-CM
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
## 2018-02-21 NOTE — Progress Notes (Signed)
Daily Session Note  Patient Details  Name: KOICHI PLATTE MRN: [id number] Date of Birth: December 17, 1960 Referring Provider:     Pulmonary Rehab Walk Test from 01/22/2018 in [place]  Referring Provider  Dr. Nelda Marseille      Encounter Date: 02/21/2018  Check In: Session Check In - 02/21/18 1112      Check-In   Supervising physician immediately available to respond to emergencies  [facility] immediately available    Physician(s)  Dr. Nevada Crane    Location  [facility]    Staff Present  Joycelyn Man RN, Maxcine Ham, RN, BSN;Carlette Wilber Oliphant, RN, BSN;Leelynd Maldonado Ysidro Evert, RN;Dalton Fletcher, MS, Exercise Physiologist    Medication changes reported      No    Fall or balance concerns reported     No    Tobacco Cessation  No Change    Warm-up and Cool-down  Performed as group-led instruction    Resistance Training Performed  Yes    VAD Patient?  No    PAD/SET Patient?  No      Pain Assessment   Currently in Pain?  No/denies    Pain Score  0-No pain    Multiple Pain Sites  No       Capillary Blood Glucose: No results found for this or any previous visit (from the past 24 hour(s)).    Social History   Tobacco Use  Smoking Status Former Smoker  . Packs/day: 2.00  . Years: 30.00  . Pack years: 60.00  . Types: Cigarettes  . Last attempt to quit: 04/08/2013  . Years since quitting: 4.8  Smokeless Tobacco Never Used    Goals Met:  Exercise tolerated well No report of cardiac concerns or symptoms Strength training completed today  Goals Unmet:  Not Applicable  Comments: Service time is from 1030 to 1220    Dr. Rush Farmer is Medical Director for Pulmonary Rehab at [facility].

## 2018-02-26 ENCOUNTER — Encounter (HOSPITAL_COMMUNITY)
Admission: RE | Admit: 2018-02-26 | Discharge: 2018-02-26 | Disposition: A | Discharge status: Home / Self Care | Payer: No Typology Code available for payment source | Source: Ambulatory Visit | Attending: Pulmonary Disease | Admitting: Pulmonary Disease

## 2018-02-26 VITALS — Wt 232.8 lb

## 2018-02-26 DIAGNOSIS — J449 Chronic obstructive pulmonary disease, unspecified: Secondary | ICD-10-CM

## 2018-02-26 DIAGNOSIS — J438 Other emphysema: Secondary | ICD-10-CM | POA: Diagnosis not present

## 2018-02-26 NOTE — Progress Notes (Signed)
Daily Session Note  Patient Details  Name: Vincent Sutton MRN: [id number] Date of Birth: Sep 30, 1960 Referring Provider:     Pulmonary Rehab Walk Test from 01/22/2018 in [place]  Referring Provider  Dr. Nelda Marseille      Encounter Date: 02/26/2018  Check In: Session Check In - 02/26/18 1222      Check-In   Supervising physician immediately available to respond to emergencies  [facility] immediately available    Physician(s)  Dr. Tana Coast    Location  [facility]    Staff Present  Rosebud Poles, RN, BSN;Carlette Wilber Oliphant, RN, Bjorn Loser, MS, Exercise Physiologist;Safiyah Cisney Ysidro Evert, Felipe Drone, RN, MHA    Medication changes reported      No    Fall or balance concerns reported     No    Tobacco Cessation  No Change    Warm-up and Cool-down  Performed as group-led instruction    Resistance Training Performed  Yes    VAD Patient?  No    PAD/SET Patient?  No      Pain Assessment   Currently in Pain?  No/denies    Pain Score  0-No pain    Multiple Pain Sites  No       Capillary Blood Glucose: No results found for this or any previous visit (from the past 24 hour(s)).  Exercise Prescription Changes - 02/26/18 1300      Response to Exercise   Blood Pressure (Admit)  114/76    Blood Pressure (Exercise)  140/64    Blood Pressure (Exit)  130/70    Heart Rate (Admit)  81 bpm    Heart Rate (Exercise)  123 bpm    Heart Rate (Exit)  90 bpm    Oxygen Saturation (Admit)  96 %    Oxygen Saturation (Exercise)  92 %    Oxygen Saturation (Exit)  97 %    Rating of Perceived Exertion (Exercise)  11    Perceived Dyspnea (Exercise)  2    Duration  Progress to 45 minutes of aerobic exercise without signs/symptoms of physical distress    Intensity  THRR unchanged      Progression   Progression  Continue to progress workloads to maintain intensity without signs/symptoms of physical distress.      Oxygen   Oxygen  Continuous    Liters  4      Treadmill   MPH  2    Grade  2    Minutes  17      Bike   Level  0.8    Minutes  17      NuStep   Level  6    SPM  80    Minutes  17    METs  2.4       Social History   Tobacco Use  Smoking Status Former Smoker  . Packs/day: 2.00  . Years: 30.00  . Pack years: 60.00  . Types: Cigarettes  . Last attempt to quit: 04/08/2013  . Years since quitting: 4.8  Smokeless Tobacco Never Used    Goals Met:  Exercise tolerated well No report of cardiac concerns or symptoms Strength training completed today  Goals Unmet:  Not Applicable  Comments: Service time is from 1030 to 1210    Dr. Rush Farmer is Medical Director for Pulmonary Rehab at [facility].

## 2018-02-28 ENCOUNTER — Encounter (HOSPITAL_COMMUNITY)
Admission: RE | Admit: 2018-02-28 | Discharge: 2018-02-28 | Disposition: A | Discharge status: Home / Self Care | Payer: No Typology Code available for payment source | Source: Ambulatory Visit | Attending: Pulmonary Disease | Admitting: Pulmonary Disease

## 2018-02-28 DIAGNOSIS — J438 Other emphysema: Secondary | ICD-10-CM | POA: Diagnosis not present

## 2018-02-28 DIAGNOSIS — J449 Chronic obstructive pulmonary disease, unspecified: Secondary | ICD-10-CM

## 2018-02-28 NOTE — Progress Notes (Signed)
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 exercise prescription;Long Term: Able to explain home exercise prescription to exercise independently  Short Term: Able to explain program exercise prescription;Long Term: Able to explain home exercise prescription to exercise independently         Exercise Goals Re-Evaluation : Exercise Goals Re-Evaluation    Row Name 10/08/17 1209 11/05/17 1627 12/04/17 1020 02/26/18 0748       Exercise Goal Re-Evaluation   Exercise Goals Review  Increase Physical Activity;Increase Strength and Stamina;Able to understand and use rate of perceived exertion (RPE) scale;Able to understand and use Dyspnea scale;Knowledge and understanding of Target Heart Rate Range (THRR);Understanding of Exercise Prescription  Increase Physical Activity;Increase Strength and Stamina;Able to understand and use rate of perceived exertion (RPE) scale;Able to understand and use Dyspnea scale;Knowledge and understanding  of Target Heart Rate Range (THRR);Understanding of Exercise Prescription  Increase Physical Activity;Increase Strength and Stamina;Able to understand and use rate of perceived exertion (RPE) scale;Able to understand and use Dyspnea scale;Knowledge and understanding of Target Heart Rate Range (THRR);Understanding of Exercise Prescription  Increase Physical Activity;Increase Strength and Stamina;Able to understand and use rate of perceived exertion (RPE) scale;Able to understand and use Dyspnea scale;Knowledge and understanding of Target Heart Rate Range (THRR);Understanding of Exercise Prescription    Comments  Patient has only attended 2 rehab sessions. Will cont. to monitor and motivate.  Patient is progressing well in rehab. His big goal is weight loss. Is active at home. Needs to concentrate on diet. Is open to workload changes.. Sometimes does desat-already on 4 liters (COPD). Will cont to monitor and motivate as able.   Patient is progressing well in rehab. His big goal is weight loss. Is active at home. Needs to concentrate on diet. Is open to workload changes.. Sometimes does desat-already on 4 liters (COPD). Will graduate from rehab today.   Pt just completed the program in October and was referred back. Pt has the ability to exercise at home, but lacks motivation. Pt gained quite a bit of weight and came back in deconditioned. Pt is currently exercising at 2.87 METs on the treadmill. I will be more aggressive with his progression as this is his second time through the program in such a short time. Will continue to monitor and progress as able.     Expected Outcomes  Through exercise at home and at rehab, patient will increase physical capacity and ADL's will become easier to perform.   Through exercise at home and at rehab, patient will increase physical capacity and ADL's will become easier to perform.   Through exercise at home and at rehab, patient will increase physical capacity and ADL's will become  easier to perform.   Through exercise at home and at rehab, patient will increase physical capacity and ADL's will become easier to perform.        Discharge Exercise Prescription (Final Exercise Prescription Changes): Exercise Prescription  Changes - 02/26/18 1300      Response to Exercise   Blood Pressure (Admit)  114/76    Blood Pressure (Exercise)  140/64    Blood Pressure (Exit)  130/70    Heart Rate (Admit)  81 bpm    Heart Rate (Exercise)  123 bpm    Heart Rate (Exit)  90 bpm    Oxygen Saturation (Admit)  96 %    Oxygen Saturation (Exercise)  92 %    Oxygen Saturation (Exit)  97 %    Rating of Perceived Exertion (Exercise)  11    Perceived Dyspnea (Exercise)  2    Duration  Progress to 45 minutes of aerobic exercise without signs/symptoms of physical distress    Intensity  THRR unchanged      Progression   Progression  Continue to progress workloads to maintain intensity without signs/symptoms of physical distress.      Oxygen   Oxygen  Continuous    Liters  4      Treadmill   MPH  2    Grade  2    Minutes  17      Bike   Level  0.8    Minutes  17      NuStep   Level  6    SPM  80    Minutes  17    METs  2.4       Nutrition:  Target Goals: Understanding of nutrition guidelines, daily intake of sodium <1542m, cholesterol <2060m calories 30% from fat and 7% or less from saturated fats, daily to have 5 or more servings of fruits and vegetables.  Biometrics:    Nutrition Therapy Plan and Nutrition Goals: Nutrition Therapy & Goals - 02/21/18 1212      Nutrition Therapy   Diet  general healthful      Personal Nutrition Goals   Nutrition Goal  Identify food quantities necessary to achieve wt loss of  -2# per week to a goal wt loss of 2.7-10.9 kg (6-24 lb) at graduation from pulmonary rehab.    Personal Goal #2  Pt to identify and limit food sources of sodium.    Personal Goal #3  The pt will have family and friends shop for food when necessary so that  nourishing foods are always available at home.      Intervention Plan   Intervention  Prescribe, educate and counsel regarding individualized specific dietary modifications aiming towards targeted core components such as weight, hypertension, lipid management, diabetes, heart failure and other comorbidities.    Expected Outcomes  Short Term Goal: Understand basic principles of dietary content, such as calories, fat, sodium, cholesterol and nutrients.;Long Term Goal: Adherence to prescribed nutrition plan.       Nutrition Assessments: Nutrition Assessments - 02/18/18 1514      Rate Your Plate Scores   Pre Score  37       Nutrition Goals Re-Evaluation: Nutrition Goals Re-Evaluation    [place] 01/18/18 1723 02/21/18 1212           Goals   Current Weight  239 lb 6.7 oz (108.6 kg)  240 lb 11.9 oz (109.2 kg)      Nutrition Goal  Identify food quantities necessary to achieve wt loss of  -2# per week to a goal wt loss of 2.7-10.9 kg (6-24 lb) at graduation from pulmonary rehab.  -      Comment  weight loss goal not met, continue to encourage weight loss  -  Nutrition Goals Discharge (Final Nutrition Goals Re-Evaluation): Nutrition Goals Re-Evaluation - 02/21/18 1212      Goals   Current Weight  240 lb 11.9 oz (109.2 kg)       Psychosocial: Target Goals: Acknowledge presence or absence of significant depression and/or stress, maximize coping skills, provide positive support system. Participant is able to verbalize types and ability to use techniques and skills needed for reducing stress and depression.  Initial Review & Psychosocial Screening: Initial Psych Review & Screening - 01/21/18 1357      Initial Review   Current issues with  Current Anxiety/Panic;Current Sleep Concerns      Family Dynamics   Good Support System?  Yes    Comments  lives with wife, two grand children      Barriers   Psychosocial barriers to participate in program  The patient should benefit  from training in stress management and relaxation.      Screening Interventions   Interventions  Encouraged to exercise    Expected Outcomes  Long Term goal: The participant improves quality of Life and PHQ9 Scores as seen by post scores and/or verbalization of changes;Short Term goal: Identification and review with participant of any Quality of Life or Depression concerns found by scoring the questionnaire.;Short Term goal: Utilizing psychosocial counselor, staff and physician to assist with identification of specific Stressors or current issues interfering with healing process. Setting desired goal for each stressor or current issue identified.       Quality of Life Scores:  Scores of 19 and below usually indicate a poorer quality of life in these areas.  A difference of  2-3 points is a clinically meaningful difference.  A difference of 2-3 points in the total score of the Quality of Life Index has been associated with significant improvement in overall quality of life, self-image, physical symptoms, and general health in studies assessing change in quality of life.  PHQ-9: Recent Review Flowsheet Data    Depression screen [facility] 2/9 01/21/2018 12/04/2017 09/19/2017   Decreased Interest 0 0 0   Down, Depressed, Hopeless 1 0 1   PHQ - 2 Score 1 0 1   Altered sleeping 3 3 3    Tired, decreased energy 3 3 3    Change in appetite 1 0 0   Feeling bad or failure about yourself  1 1 1    Trouble concentrating 1 1 3    Moving slowly or fidgety/restless 1 1 1    Suicidal thoughts 0 0 1   PHQ-9 Score 11 9 13    Difficult doing work/chores Somewhat difficult Somewhat difficult Very difficult     Interpretation of Total Score  Total Score Depression Severity:  1-4 = Minimal depression, 5-9 = Mild depression, 10-14 = Moderate depression, 15-19 = Moderately severe depression, 20-27 = Severe depression   Psychosocial Evaluation and Intervention: Psychosocial Evaluation - 01/30/18 1658      Psychosocial  Evaluation & Interventions   Interventions  Stress management education;Relaxation education;Encouraged to exercise with the program and follow exercise prescription    Comments  Pt receives therapy for PTSD, medication for anxiety.  Pt with brief hospital stay for paniac attacks.  continue to support pt in anyway    Expected Outcomes  Pt will demonstrate positive healthy coping skills     Continue Psychosocial Services   Follow up required by staff       Psychosocial Re-Evaluation: Psychosocial Re-Evaluation    [place] Name 10/09/17 [phone] 11/07/17 1346 01/30/18 1658 02/28/18 1003  Psychosocial Re-Evaluation   Current issues with  Current Sleep Concerns;Current Anxiety/Panic  Current Sleep Concerns;Current Anxiety/Panic  Current Sleep Concerns;Current Anxiety/Panic  Current Sleep Concerns;Current Anxiety/Panic    Comments  Pt has financial anxiety due to caring for two 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  With back to school supplies and clothing needed this is difficult for pt to manage  Pt has financial anxiety due to caring for two 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  With back to school supplies and clothing needed this is difficult for pt to manage  Pt has financial anxiety due to caring for two 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  With back to school supplies and clothing needed this is difficult for pt to manage  Pt has financial anxiety due to caring for two school aged children, but has support from wife     Expected Outcomes  Pt will develop strategies to deal with anxiety and usage of community resources to ease the financial burden.  Pt report improved sleep patterns.  Pt will develop strategies to deal with anxiety and usage of community resources to ease the financial burden.  Pt report improved sleep patterns.  Pt will develop strategies to deal with anxiety and usage of community resources to ease the financial burden.  Pt report improved sleep patterns.  Pt will develop strategies to deal with anxiety and usage of community  resources to ease the financial burden.  Pt report improved sleep patterns.    Interventions  Stress management education;Relaxation education;Encouraged to attend Pulmonary Rehabilitation for the exercise  Stress management education;Relaxation education;Encouraged to attend Pulmonary Rehabilitation for the exercise  Stress management education;Relaxation education;Encouraged to attend Pulmonary Rehabilitation for the exercise  Stress management education;Relaxation education;Encouraged to attend Pulmonary Rehabilitation for the exercise    Continue Psychosocial Services   Follow up required by staff  Follow up required by staff  Follow up required by staff  Follow up required by staff       Psychosocial Discharge (Final Psychosocial Re-Evaluation): Psychosocial Re-Evaluation - 02/28/18 1003      Psychosocial Re-Evaluation   Current issues with  Current Sleep Concerns;Current Anxiety/Panic    Comments  Pt has financial anxiety due to caring for two school aged children, but has support from wife     Expected Outcomes  Pt will develop strategies to deal with anxiety and usage of community resources to ease the financial burden.  Pt report improved sleep patterns.    Interventions  Stress management education;Relaxation education;Encouraged to attend Pulmonary Rehabilitation for the exercise    Continue Psychosocial Services   Follow up required by staff       Education: Education Goals: Education classes will be provided on a weekly basis, covering required topics. Participant will state understanding/return demonstration of topics presented.  Learning Barriers/Preferences: Learning Barriers/Preferences - 01/21/18 1358      Learning Barriers/Preferences   Learning Barriers  Hearing    Learning Preferences  Group Instruction;Individual Instruction;Written Material       Education Topics: Risk Factor Reduction:  -Group instruction that is supported by a PowerPoint presentation.  Instructor discusses the definition of a risk factor, different risk factors for pulmonary disease, and how the heart and lungs work together.     Nutrition for Pulmonary Patient:  -Group instruction provided by PowerPoint slides, verbal discussion, and written materials to support subject matter. The instructor gives an explanation and review of healthy diet recommendations, which includes a discussion on weight management, recommendations for fruit and vegetable consumption, as well as protein, fluid, caffeine, fiber,  sodium, sugar, and alcohol. Tips for eating when patients are short of breath are discussed.   PULMONARY REHAB CHRONIC OBSTRUCTIVE PULMONARY DISEASE from 02/21/2018 in [place]  Date  10/18/17  Educator  Rodman Pickle  Instruction Review Code  2- Demonstrated Understanding      Pursed Lip Breathing:  -Group instruction that is supported by demonstration and informational handouts. Instructor discusses the benefits of pursed lip and diaphragmatic breathing and detailed demonstration on how to preform both.     Oxygen Safety:  -Group instruction provided by PowerPoint, verbal discussion, and written material to support subject matter. There is an overview of "What is Oxygen" and "Why do we need it".  Instructor also reviews how to create a safe environment for oxygen use, the importance of using oxygen as prescribed, and the risks of noncompliance. There is a brief discussion on traveling with oxygen and resources the patient may utilize.   PULMONARY REHAB CHRONIC OBSTRUCTIVE PULMONARY DISEASE from 02/21/2018 in [place]  Date  02/07/18  Educator  Remo Lipps  Instruction Review Code  1- Verbalizes Understanding      Oxygen Equipment:  -Group instruction provided by [employer] utilizing handouts, written materials, and Insurance underwriter.   PULMONARY REHAB CHRONIC OBSTRUCTIVE PULMONARY DISEASE from 02/21/2018 in  [place]  Date  11/29/17  Educator  Ace Gins  Instruction Review Code  1- Verbalizes Understanding      Signs and Symptoms:  -Group instruction provided by written material and verbal discussion to support subject matter. Warning signs and symptoms of infection, stroke, and heart attack are reviewed and when to call the physician/911 reinforced. Tips for preventing the spread of infection discussed.   PULMONARY REHAB CHRONIC OBSTRUCTIVE PULMONARY DISEASE from 02/21/2018 in [place]  Date  02/21/18  Educator  Remo Lipps  Instruction Review Code  1- Verbalizes Understanding      Advanced Directives:  -Group instruction provided by verbal instruction and written material to support subject matter. Instructor reviews Advanced Directive laws and proper instruction for filling out document.   Pulmonary Video:  -Group video education that reviews the importance of medication and oxygen compliance, exercise, good nutrition, pulmonary hygiene, and pursed lip and diaphragmatic breathing for the pulmonary patient.   Exercise for the Pulmonary Patient:  -Group instruction that is supported by a PowerPoint presentation. Instructor discusses benefits of exercise, core components of exercise, frequency, duration, and intensity of an exercise routine, importance of utilizing pulse oximetry during exercise, safety while exercising, and options of places to exercise outside of rehab.     Pulmonary Medications:  -Verbally interactive group education provided by instructor with focus on inhaled medications and proper administration.   PULMONARY REHAB CHRONIC OBSTRUCTIVE PULMONARY DISEASE from 02/21/2018 in [place]  Date  10/30/17  Educator  Pharm      Anatomy and Physiology of the Respiratory System and Intimacy:  -Group instruction provided by PowerPoint, verbal discussion, and written material to support  subject matter. Instructor reviews respiratory cycle and anatomical components of the respiratory system and their functions. Instructor also reviews differences in obstructive and restrictive respiratory diseases with examples of each. Intimacy, Sex, and Sexuality differences are reviewed with a discussion on how relationships can change when diagnosed with pulmonary disease. Common sexual concerns are reviewed.   PULMONARY REHAB CHRONIC OBSTRUCTIVE PULMONARY DISEASE from 02/21/2018 in [place]  Date  10/11/17  Educator  rn  Instruction Review Code  2- Demonstrated Understanding      MD DAY -A group question and answer session with a medical doctor that allows participants to ask questions that relate to their pulmonary disease state.   OTHER EDUCATION -Group or individual verbal, written, or video instructions that support the educational goals of the pulmonary rehab program.   Holiday Eating Survival Tips:  -Group instruction provided by PowerPoint slides, verbal discussion, and written materials to support subject matter. The instructor gives patients tips, tricks, and techniques to help them not only survive but enjoy the holidays despite the onslaught of food that accompanies the holidays.   Knowledge Questionnaire Score: Knowledge Questionnaire Score - 01/22/18 0857      Knowledge Questionnaire Score   Pre Score  15/18       Core Components/Risk Factors/Patient Goals at Admission: Personal Goals and Risk Factors at Admission - 01/21/18 1358      Core Components/Risk Factors/Patient Goals on Admission    Weight Management  Yes;Obesity    Intervention  Weight Management: Develop a combined nutrition and exercise program designed to reach desired caloric intake, while maintaining appropriate intake of nutrient and fiber, sodium and fats, and appropriate energy expenditure required for the weight goal.;Weight Management: Provide education and  appropriate resources to help participant work on and attain dietary goals.;Weight Management/Obesity: Establish reasonable short term and long term weight goals.;Obesity: Provide education and appropriate resources to help participant work on and attain dietary goals.    Admit Weight  238 lb 12.1 oz (108.3 kg)    Goal Weight: Short Term  218 lb (98.9 kg)    Goal Weight: Long Term  200 lb (90.7 kg)    Expected Outcomes  Short Term: Continue to assess and modify interventions until short term weight is achieved;Long Term: Adherence to nutrition and physical activity/exercise program aimed toward attainment of established weight goal;Weight Loss: Understanding of general recommendations for a balanced deficit meal plan, which promotes 1-2 lb weight loss per week and includes a negative energy balance of [phone] kcal/d;Understanding recommendations for meals to include 15-35% energy as protein, 25-35% energy from fat, 35-60% energy from carbohydrates, less than 255m of dietary cholesterol, 20-35 gm of total fiber daily;Understanding of distribution of calorie intake throughout the day with the consumption of 4-5 meals/snacks    Improve shortness of breath with ADL's  Yes    Intervention  Provide education, individualized exercise plan and daily activity instruction to help decrease symptoms of SOB with activities of daily living.    Expected Outcomes  Short Term: Improve cardiorespiratory fitness to achieve a reduction of symptoms when performing ADLs;Long Term: Be able to perform more ADLs without symptoms or delay the onset of symptoms       Core Components/Risk Factors/Patient Goals Review:  Goals and Risk Factor Review    Row Name 10/09/17 0948 10/10/17 0929 11/07/17 1348 01/30/18 1658 02/28/18 1004     Core Components/Risk Factors/Patient Goals Review   Personal Goals Review  Weight Management/Obesity;Improve shortness of breath with ADL's;Increase knowledge of respiratory medications and ability  to use respiratory devices properly.;Develop more efficient breathing techniques such as purse lipped breathing and diaphragmatic breathing and practicing self-pacing with activity.  Weight Management/Obesity;Improve shortness of breath with ADL's;Increase knowledge of respiratory medications and ability to use respiratory devices properly.;Develop more efficient breathing techniques such as purse lipped breathing and diaphragmatic breathing and practicing self-pacing with activity.  Weight Management/Obesity;Improve shortness of breath with ADL's;Increase knowledge of respiratory  medications and ability to use respiratory devices properly.;Develop more efficient breathing techniques such as purse lipped breathing and diaphragmatic breathing and practicing self-pacing with activity.  Weight Management/Obesity;Improve shortness of breath with ADL's;Increase knowledge of respiratory medications and ability to use respiratory devices properly.;Develop more efficient breathing techniques such as purse lipped breathing and diaphragmatic breathing and practicing self-pacing with activity.  Weight Management/Obesity;Improve shortness of breath with ADL's;Increase knowledge of respiratory medications and ability to use respiratory devices properly.;Develop more efficient breathing techniques such as purse lipped breathing and diaphragmatic breathing and practicing self-pacing with activity.   Review  Pt has completed 2 exercise sessions since 8/13.  Unable to adquately assess pt progress toward pulmonary goals. I anticipate in the next 30 days pt will show progress toward pulmonary goals.  -  Pt has completed 10  exercise sessions since 8/13.  Pt is begining to interact with fellow participants however pt interacts well with staff and often is joking with them.  Pt has attended respiratory medication class.  Pt needs additonal coaching for proper breathing techniques.  Pt with minimal weight loss of .3kg.  Pt has not been  able to engage in home exercise out outside of rehab although he is [place] with his young grandchildren.  Hopefully as pt becomes more conditioned he will be able to begin some exercise.  Pt with increase on treadmill to 2/4/4, airydyne .9 and nustep at level 5I anticipate in the next 30 days pt will show progress toward pulmonary goals.  Pt to begin pulmonary rehab on 12/12.    Pt has completed 5 excercise sessions and 2 education sessions. Pt has began to see good weight loss with a decrease of 3.4 kg since beginning exercise on 12/17. He is able to demonstrate good PLB technique with staff promting. Pt will strive to be more independent with breathing technique. His workloads have already been increases to 2.0/2.0 on the treadmill, level 6 on the nustep and 0.8 on the airdyne. Will continue to monitor for improvements in the next 30 day review.   Expected Outcomes  See "admission goals"  See Admission Goals/Outcomes  See Admission Goals/Outcomes  See Admission Goals/Outcomes  see admission goals and outcomes      Core Components/Risk Factors/Patient Goals at Discharge (Final Review):  Goals and Risk Factor Review - 02/28/18 1004      Core Components/Risk Factors/Patient Goals Review   Personal Goals Review  Weight Management/Obesity;Improve shortness of breath with ADL's;Increase knowledge of respiratory medications and ability to use respiratory devices properly.;Develop more efficient breathing techniques such as purse lipped breathing and diaphragmatic breathing and practicing self-pacing with activity.    Review  Pt has completed 5 excercise sessions and 2 education sessions. Pt has began to see good weight loss with a decrease of 3.4 kg since beginning exercise on 12/17. He is able to demonstrate good PLB technique with staff promting. Pt will strive to be more independent with breathing technique. His workloads have already been increases to 2.0/2.0 on the treadmill, level 6 on the nustep and 0.8  on the airdyne. Will continue to monitor for improvements in the next 30 day review.    Expected Outcomes  see admission goals and outcomes       ITP Comments: ITP Comments    Row Name 09/19/17 1358 10/10/17 0929 11/07/17 1345 01/21/18 1346 01/30/18 1658   ITP Comments  Dr. Manfred Arch, Medical Director  Dr. Manfred Arch, Medical Director  Dr. Manfred Arch, Medical Director Pulmonary Rehab  Dr. Manfred Arch, Medical Director Pulmonary Rehab  Dr. Manfred Arch, Medical Director Pulmonary [place] Name 02/28/18 1001           ITP Comments  Dr. Manfred Arch, Medical Director Pulmonary Rehab          Comments: ITP REVIEW Pt is making expected progress toward personal goals after completing 5 sessions.   Recommend continued exercise, life style modification, education, and utilization of breathing texhniques to increase stamina and strength and decrease shortness of breath with exertion.  Joycelyn Man RN, BSN Cardiac and Pulmonary Rehab RN

## 2018-02-28 NOTE — Progress Notes (Signed)
Daily Session Note  Patient Details  Name: Vincent Sutton MRN: [id number] Date of Birth: July 25, 1960 Referring Provider:     Pulmonary Rehab Walk Test from 01/22/2018 in [place]  Referring Provider  Dr. Nelda Marseille      Encounter Date: 02/28/2018  Check In: Session Check In - 02/28/18 1040      Check-In   Supervising physician immediately available to respond to emergencies  [facility] immediately available    Physician(s)  Dr. Bonner Puna    Location  [facility]    Staff Present  Joycelyn Man RN, Maxcine Ham, RN, BSN;Carlette Wilber Oliphant, RN, BSN;Lisa Ysidro Evert, RN;Dalton Fletcher, MS, Exercise Physiologist    Medication changes reported      No    Fall or balance concerns reported     No    Tobacco Cessation  No Change    Warm-up and Cool-down  Performed as group-led instruction    Resistance Training Performed  Yes    VAD Patient?  No    PAD/SET Patient?  No      Pain Assessment   Currently in Pain?  No/denies    Pain Score  0-No pain    Multiple Pain Sites  No       Capillary Blood Glucose: No results found for this or any previous visit (from the past 24 hour(s)).    Social History   Tobacco Use  Smoking Status Former Smoker  . Packs/day: 2.00  . Years: 30.00  . Pack years: 60.00  . Types: Cigarettes  . Last attempt to quit: 04/08/2013  . Years since quitting: 4.8  Smokeless Tobacco Never Used    Goals Met:  Proper associated with RPD/PD & O2 Sat Exercise tolerated well Strength training completed today  Goals Unmet:  Not Applicable  Comments: Service time is from 1030 to 1210   Dr. Rush Farmer is Medical Director for Pulmonary Rehab at [facility]

## 2018-03-05 ENCOUNTER — Encounter (HOSPITAL_COMMUNITY)
Admission: RE | Admit: 2018-03-05 | Discharge: 2018-03-05 | Disposition: A | Discharge status: Home / Self Care | Payer: No Typology Code available for payment source | Source: Ambulatory Visit | Attending: Pulmonary Disease | Admitting: Pulmonary Disease

## 2018-03-05 DIAGNOSIS — J438 Other emphysema: Secondary | ICD-10-CM

## 2018-03-05 DIAGNOSIS — J449 Chronic obstructive pulmonary disease, unspecified: Secondary | ICD-10-CM

## 2018-03-05 NOTE — Progress Notes (Signed)
Daily Session Note  Patient Details  Name: Vincent Sutton MRN: [id number] Date of Birth: 1960-07-19 Referring Provider:     Pulmonary Rehab Walk Test from 01/22/2018 in [place]  Referring Provider  Dr. Nelda Marseille      Encounter Date: 03/05/2018  Check In: Session Check In - 03/05/18 1220      Check-In   Supervising physician immediately available to respond to emergencies  [facility] immediately available    Physician(s)  Dr. Reesa Chew    Location  [facility]    Staff Present  Joycelyn Man RN, Maxcine Ham, RN, BSN;Carlette Wilber Oliphant, RN, Bjorn Loser, MS, Exercise Physiologist;Lisa Ysidro Evert, RN    Medication changes reported      No    Fall or balance concerns reported     No    Tobacco Cessation  No Change    Warm-up and Cool-down  Performed as group-led instruction    Resistance Training Performed  Yes    VAD Patient?  No    PAD/SET Patient?  No      Pain Assessment   Currently in Pain?  No/denies    Pain Score  0-No pain    Multiple Pain Sites  No       Capillary Blood Glucose: No results found for this or any previous visit (from the past 24 hour(s)).    Social History   Tobacco Use  Smoking Status Former Smoker  . Packs/day: 2.00  . Years: 30.00  . Pack years: 60.00  . Types: Cigarettes  . Last attempt to quit: 04/08/2013  . Years since quitting: 4.9  Smokeless Tobacco Never Used    Goals Met:  Proper associated with RPD/PD & O2 Sat Exercise tolerated well Strength training completed today  Goals Unmet:  Not Applicable  Comments: Service time is from 1030 to 1205    Dr. Rush Farmer is Medical Director for Pulmonary Rehab at [facility].

## 2018-03-07 ENCOUNTER — Encounter (HOSPITAL_COMMUNITY): Payer: No Typology Code available for payment source

## 2018-03-12 ENCOUNTER — Encounter (HOSPITAL_COMMUNITY)
Admission: RE | Admit: 2018-03-12 | Discharge: 2018-03-12 | Disposition: A | Discharge status: Home / Self Care | Payer: No Typology Code available for payment source | Source: Ambulatory Visit | Attending: Pulmonary Disease | Admitting: Pulmonary Disease

## 2018-03-12 VITALS — Wt 234.6 lb

## 2018-03-12 DIAGNOSIS — J449 Chronic obstructive pulmonary disease, unspecified: Secondary | ICD-10-CM

## 2018-03-12 DIAGNOSIS — J438 Other emphysema: Secondary | ICD-10-CM | POA: Diagnosis not present

## 2018-03-12 NOTE — Progress Notes (Signed)
Daily Session Note  Patient Details  Name: Vincent Sutton MRN: [id number] Date of Birth: [birth year] Referring Provider:     Pulmonary Rehab Walk Test from 01/22/2018 in [place]  Referring Provider  Dr. Nelda Marseille      Encounter Date: 03/12/2018  Check In: Session Check In - 03/12/18 1030      Check-In   Supervising physician immediately available to respond to emergencies  [facility] immediately available    Physician(s)  Dr. Waldron Labs    Location  [facility]    Staff Present  Joycelyn Man RN, BSN;Carlette Wilber Oliphant, RN, Bjorn Loser, MS, Exercise Physiologist;Joan Leonia Reeves, RN, BSN    Medication changes reported      No    Fall or balance concerns reported     No    Tobacco Cessation  No Change    Warm-up and Cool-down  Performed as group-led instruction    Resistance Training Performed  Yes    VAD Patient?  No    PAD/SET Patient?  No      Pain Assessment   Currently in Pain?  No/denies    Multiple Pain Sites  No       Capillary Blood Glucose: No results found for this or any previous visit (from the past 24 hour(s)).  Exercise Prescription Changes - 03/12/18 1500      Response to Exercise   Blood Pressure (Admit)  128/58    Blood Pressure (Exercise)  120/60    Blood Pressure (Exit)  114/64    Heart Rate (Admit)  96 bpm    Heart Rate (Exercise)  140 bpm    Heart Rate (Exit)  106 bpm    Oxygen Saturation (Admit)  94 %    Oxygen Saturation (Exercise)  92 %    Oxygen Saturation (Exit)  97 %    Rating of Perceived Exertion (Exercise)  11    Perceived Dyspnea (Exercise)  2    Duration  Progress to 45 minutes of aerobic exercise without signs/symptoms of physical distress    Intensity  THRR unchanged      Progression   Progression  Continue to progress workloads to maintain intensity without signs/symptoms of physical distress.      Resistance Training   Training Prescription  Yes    Weight  blue bands     Reps  10-15    Time  10 Minutes      Oxygen   Oxygen  Continuous    Liters  4      Treadmill   MPH  2    Grade  2    Minutes  17      Bike   Level  0.6    Minutes  17      NuStep   Level  5    SPM  80    Minutes  17    METs  2.3       Social History   Tobacco Use  Smoking Status Former Smoker  . Packs/day: 2.00  . Years: 30.00  . Pack years: 60.00  . Types: Cigarettes  . Last attempt to quit: 04/08/2013  . Years since quitting: 4.9  Smokeless Tobacco Never Used    Goals Met:  Proper associated with RPD/PD & O2 Sat Exercise tolerated well Strength training completed today  Goals Unmet:  Not Applicable  Comments: Service time is from 1030 to 1210    Dr. Rush Farmer is Medical Director  for Pulmonary Rehab at [facility].

## 2018-03-14 ENCOUNTER — Encounter (HOSPITAL_COMMUNITY): Payer: No Typology Code available for payment source

## 2018-03-19 ENCOUNTER — Encounter (HOSPITAL_COMMUNITY)
Admission: RE | Admit: 2018-03-19 | Discharge: 2018-03-19 | Disposition: A | Discharge status: Home / Self Care | Payer: No Typology Code available for payment source | Source: Ambulatory Visit | Attending: Pulmonary Disease | Admitting: Pulmonary Disease

## 2018-03-19 DIAGNOSIS — J438 Other emphysema: Secondary | ICD-10-CM | POA: Diagnosis not present

## 2018-03-19 DIAGNOSIS — J449 Chronic obstructive pulmonary disease, unspecified: Secondary | ICD-10-CM

## 2018-03-19 NOTE — Progress Notes (Signed)
Daily Session Note  Patient Details  Name: Vincent Sutton MRN: [id number] Date of Birth: 12/15/1960 Referring Provider:     Pulmonary Rehab Walk Test from 01/22/2018 in [place]  Referring Provider  Dr. Nelda Marseille      Encounter Date: 03/19/2018  Check In: Session Check In - 03/19/18 1030      Check-In   Supervising physician immediately available to respond to emergencies  [facility] immediately available    Physician(s)  Dr. Lonny Prude    Location  [facility]    Staff Present  Joycelyn Man RN, BSN;Molly DiVincenzo, MS, ACSM RCEP, Exercise Physiologist;Dalton Kris Mouton, MS, Exercise Physiologist;Zyire Eidson Leonia Reeves, RN, BSN;Carlette Wilber Oliphant, RN, Roque Cash, RN    Medication changes reported      No    Fall or balance concerns reported     No    Tobacco Cessation  No Change    Warm-up and Cool-down  Performed as group-led instruction    Resistance Training Performed  Yes    VAD Patient?  No    PAD/SET Patient?  No      Pain Assessment   Currently in Pain?  No/denies    Multiple Pain Sites  No       Capillary Blood Glucose: No results found for this or any previous visit (from the past 24 hour(s)).    Social History   Tobacco Use  Smoking Status Former Smoker  . Packs/day: 2.00  . Years: 30.00  . Pack years: 60.00  . Types: Cigarettes  . Last attempt to quit: 04/08/2013  . Years since quitting: 4.9  Smokeless Tobacco Never Used    Goals Met:  Proper associated with RPD/PD & O2 Sat Exercise tolerated well Strength training completed today  Goals Unmet:  Not Applicable  Comments: Service time is from 1030 to 1210    Dr. Rush Farmer is Medical Director for Pulmonary Rehab at [facility].

## 2018-03-21 ENCOUNTER — Encounter (HOSPITAL_COMMUNITY)
Admission: RE | Admit: 2018-03-21 | Discharge: 2018-03-21 | Disposition: A | Discharge status: Home / Self Care | Payer: No Typology Code available for payment source | Source: Ambulatory Visit | Attending: Pulmonary Disease | Admitting: Pulmonary Disease

## 2018-03-21 DIAGNOSIS — J438 Other emphysema: Secondary | ICD-10-CM

## 2018-03-21 DIAGNOSIS — J449 Chronic obstructive pulmonary disease, unspecified: Secondary | ICD-10-CM

## 2018-03-21 NOTE — Progress Notes (Signed)
Daily Session Note  Patient Details  Name: Vincent Sutton MRN: [id number] Date of Birth: April 30, 1960 Referring Provider:     Pulmonary Rehab Walk Test from 01/22/2018 in [place]  Referring Provider  Dr. Nelda Marseille      Encounter Date: 03/21/2018  Check In: Session Check In - 03/21/18 1058      Check-In   Supervising physician immediately available to respond to emergencies  [facility] immediately available    Physician(s)  Dr. Waldron Labs    Location  [facility]    Staff Present  Joycelyn Man RN, BSN;Molly DiVincenzo, MS, ACSM RCEP, Exercise Physiologist;Dalton Kris Mouton, MS, Exercise Physiologist;Carlette Wilber Oliphant, RN, Roque Cash, RN    Medication changes reported      No    Fall or balance concerns reported     No    Tobacco Cessation  No Change    Warm-up and Cool-down  Performed as group-led instruction    Resistance Training Performed  Yes    VAD Patient?  No    PAD/SET Patient?  No      Pain Assessment   Currently in Pain?  No/denies    Multiple Pain Sites  No       Capillary Blood Glucose: No results found for this or any previous visit (from the past 24 hour(s)).    Social History   Tobacco Use  Smoking Status Former Smoker  . Packs/day: 2.00  . Years: 30.00  . Pack years: 60.00  . Types: Cigarettes  . Last attempt to quit: 04/08/2013  . Years since quitting: 4.9  Smokeless Tobacco Never Used    Goals Met:  Exercise tolerated well No report of cardiac concerns or symptoms Strength training completed today  Goals Unmet:  Not Applicable  Comments: Service time is from 1030 to 1225    Dr. Rush Farmer is Medical Director for Pulmonary Rehab at [facility].

## 2018-03-25 ENCOUNTER — Encounter (HOSPITAL_COMMUNITY): Payer: Self-pay

## 2018-03-26 ENCOUNTER — Encounter (HOSPITAL_COMMUNITY)
Admission: RE | Admit: 2018-03-26 | Discharge: 2018-03-26 | Disposition: A | Discharge status: Home / Self Care | Payer: No Typology Code available for payment source | Source: Ambulatory Visit | Attending: Pulmonary Disease | Admitting: Pulmonary Disease

## 2018-03-26 VITALS — Wt 235.0 lb

## 2018-03-26 DIAGNOSIS — J449 Chronic obstructive pulmonary disease, unspecified: Secondary | ICD-10-CM

## 2018-03-26 DIAGNOSIS — J438 Other emphysema: Secondary | ICD-10-CM | POA: Insufficient documentation

## 2018-03-26 NOTE — Progress Notes (Signed)
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 Physical Activity;Increase Strength and Stamina;Able to understand and use rate of perceived exertion (RPE) scale;Able to understand and use Dyspnea scale;Knowledge and understanding of Target Heart Rate Range (THRR);Understanding of Exercise Prescription  Increase Physical Activity;Increase Strength and Stamina;Able to understand and use rate of perceived exertion (RPE) scale;Able to understand and use Dyspnea scale;Knowledge and understanding of Target Heart Rate Range (THRR);Understanding of Exercise Prescription  Increase Physical Activity;Increase Strength and Stamina;Able to understand and use rate of perceived exertion (RPE) scale;Able  to understand and use Dyspnea scale;Knowledge and understanding of Target Heart Rate Range (THRR);Understanding of Exercise Prescription   Comments  Patient has only attended 2 rehab sessions. Will cont. to monitor and motivate.  Patient is progressing well in rehab. His big goal is weight loss. Is active at home. Needs to concentrate on diet. Is open to workload changes.. Sometimes does desat-already on 4 liters (COPD). Will cont to monitor and motivate as able.   Patient is progressing well in rehab. His big goal is weight loss. Is active at home. Needs to concentrate on diet. Is open to workload changes.. Sometimes does desat-already on 4 liters (COPD). Will graduate from rehab today.   Pt just completed the program in October and was referred back. Pt has the ability to exercise at home, but lacks motivation. Pt gained quite a bit of weight and came back in deconditioned. Pt is currently exercising at 2.87 METs on the treadmill. I will be more aggressive with his progression as this is his second time through the program in such a short time. Will continue to monitor and progress as able.   Pt is progressing slower than I would like considering that he just completed the program a few months ago. I have sent a fax to Dr. Gwenette Greet from the [facility] seeking permission to engage pt in HIIT exercise while on the airdyne bike in an attempt to make the pt become more engaged. He is currently exercising at 3.08 METs while on the treadmill. Will continue to monitor and progress as able.    Expected Outcomes  Through exercise at home and at rehab, patient will increase physical capacity and ADL's will become easier to perform.   Through exercise at home and at rehab, patient will increase physical capacity and ADL's will become easier to perform.   Through exercise at home and at rehab, patient will increase physical capacity and ADL's will become easier to perform.   Through exercise at home and at rehab, patient  will increase physical capacity and ADL's will become easier to perform.   Through exercise at rehab and at home, the patient will decrease shortness of breath with daily activities and feel confident in carrying out an  exercise regime at home.       Discharge Exercise Prescription (Final Exercise Prescription Changes): Exercise Prescription Changes - 03/26/18 1200      Response to Exercise   Blood Pressure (Admit)  114/78    Blood Pressure (Exercise)  150/70    Blood Pressure (Exit)  120/60    Heart Rate (Admit)  75 bpm    Heart Rate (Exercise)  103 bpm    Heart Rate (Exit)  80 bpm    Oxygen Saturation (Admit)  98 %    Oxygen Saturation (Exercise)  94 %    Oxygen Saturation (Exit)  96 %    Rating of Perceived Exertion (Exercise)  11    Perceived Dyspnea (Exercise)  3    Duration  Progress to 45 minutes of aerobic exercise without signs/symptoms of physical distress    Intensity  THRR unchanged      Progression   Progression  Continue to progress workloads to maintain intensity without signs/symptoms of physical distress.      Resistance Training   Training Prescription  Yes    Weight  blue bands    Reps  10-15    Time  10 Minutes      Oxygen   Oxygen  Continuous    Liters  4      Treadmill   MPH  2.2    Grade  3    Minutes  17      Bike   Level  0.5    Minutes  17      NuStep   Level  5    SPM  80    Minutes  17    METs  2.1       Nutrition:  Target Goals: Understanding of nutrition guidelines, daily intake of sodium <1559m, cholesterol <2055m calories 30% from fat and 7% or less from saturated fats, daily to have 5 or more servings of fruits and vegetables.  Biometrics:    Nutrition Therapy Plan and Nutrition Goals: Nutrition Therapy & Goals - 02/21/18 1212      Nutrition Therapy   Diet  general healthful      Personal Nutrition Goals   Nutrition Goal  Identify food quantities necessary to achieve wt loss of  -2# per week to a goal wt loss of  2.7-10.9 kg (6-24 lb) at graduation from pulmonary rehab.    Personal Goal #2  Pt to identify and limit food sources of sodium.    Personal Goal #3  The pt will have family and friends shop for food when necessary so that nourishing foods are always available at home.      Intervention Plan   Intervention  Prescribe, educate and counsel regarding individualized specific dietary modifications aiming towards targeted core components such as weight, hypertension, lipid management, diabetes, heart failure and other comorbidities.    Expected Outcomes  Short Term Goal: Understand basic principles of dietary content, such as calories, fat, sodium, cholesterol and nutrients.;Long Term Goal: Adherence to prescribed nutrition plan.       Nutrition Assessments: Nutrition Assessments - 02/18/18 1514      Rate Your Plate Scores   Pre Score  37       Nutrition Goals Re-Evaluation: Nutrition Goals Re-Evaluation    Row Name 01/18/18 1723 02/21/18 1212           Goals   Current Weight  239 lb 6.7 oz (108.6 kg)  240 lb 11.9 oz (109.2 kg)  Nutrition Goal  Identify food quantities necessary to achieve wt loss of  -2# per week to a goal wt loss of 2.7-10.9 kg (6-24 lb) at graduation from pulmonary rehab.  -      Comment  weight loss goal not met, continue to encourage weight loss  -         Nutrition Goals Discharge (Final Nutrition Goals Re-Evaluation): Nutrition Goals Re-Evaluation - 02/21/18 1212      Goals   Current Weight  240 lb 11.9 oz (109.2 kg)       Psychosocial: Target Goals: Acknowledge presence or absence of significant depression and/or stress, maximize coping skills, provide positive support system. Participant is able to verbalize types and ability to use techniques and skills needed for reducing stress and depression.  Initial Review & Psychosocial Screening: Initial Psych Review & Screening - 01/21/18 1357      Initial Review   Current issues with  Current  Anxiety/Panic;Current Sleep Concerns      Family Dynamics   Good Support System?  Yes    Comments  lives with wife, two grand children      Barriers   Psychosocial barriers to participate in program  The patient should benefit from training in stress management and relaxation.      Screening Interventions   Interventions  Encouraged to exercise    Expected Outcomes  Long Term goal: The participant improves quality of Life and PHQ9 Scores as seen by post scores and/or verbalization of changes;Short Term goal: Identification and review with participant of any Quality of Life or Depression concerns found by scoring the questionnaire.;Short Term goal: Utilizing psychosocial counselor, staff and physician to assist with identification of specific Stressors or current issues interfering with healing process. Setting desired goal for each stressor or current issue identified.       Quality of Life Scores:  Scores of 19 and below usually indicate a poorer quality of life in these areas.  A difference of  2-3 points is a clinically meaningful difference.  A difference of 2-3 points in the total score of the Quality of Life Index has been associated with significant improvement in overall quality of life, self-image, physical symptoms, and general health in studies assessing change in quality of life.  PHQ-9: Recent Review Flowsheet Data    Depression screen [facility] 2/9 01/21/2018 12/04/2017 09/19/2017   Decreased Interest 0 0 0   Down, Depressed, Hopeless 1 0 1   PHQ - 2 Score 1 0 1   Altered sleeping _0 Tired, decreased energy _1 Change in appetite 1 0 0   Feeling bad or failure about yourself  _2 Trouble concentrating _3 Moving slowly or fidgety/restless _4 Suicidal thoughts 0 0 1   PHQ-9 Score _5 Difficult doing work/chores Somewhat difficult Somewhat difficult Very difficult     Interpretation of Total Score  Total Score Depression Severity:  1-4 = Minimal  depression, 5-9 = Mild depression, 10-14 = Moderate depression, 15-19 = Moderately severe depression, 20-27 = Severe depression   Psychosocial Evaluation and Intervention: Psychosocial Evaluation - 03/25/18 0917      Psychosocial Evaluation & Interventions   Interventions  Stress management education;Relaxation education;Encouraged to exercise with the program and follow exercise prescription    Comments  Pt receives therapy for PTSD, medication for anxiety. Continue to support pt in anyway  Expected Outcomes  Pt will demonstrate positive healthy coping skills     Continue Psychosocial Services   Follow up required by staff       Psychosocial Re-Evaluation: Psychosocial Re-Evaluation    [place] Name 10/09/17 0943 11/07/17 1346 01/30/18 1658 02/28/18 1003 03/26/18 0730     Psychosocial Re-Evaluation   Current issues with  Current Sleep Concerns;Current Anxiety/Panic  Current Sleep Concerns;Current Anxiety/Panic  Current Sleep Concerns;Current Anxiety/Panic  Current Sleep Concerns;Current Anxiety/Panic  Current Sleep Concerns;Current Anxiety/Panic   Comments  Pt has financial anxiety due to caring for two 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  With back to school supplies and clothing needed this is difficult for pt to manage  Pt has financial anxiety due to caring for two 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  With back to school supplies and clothing needed this is difficult for pt to manage  Pt has financial anxiety due to caring for two 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  With back to school supplies and clothing needed this is difficult for pt to manage  Pt has financial anxiety due to caring for two school aged children, but has support from wife   Pt has financial anxiety due to caring for two school aged children, but has support from wife. Pt is an active participant in rehab and if often seen socializing with staff and other participants.   Expected Outcomes  Pt will develop strategies to deal with anxiety and usage of community  resources to ease the financial burden.  Pt report improved sleep patterns.  Pt will develop strategies to deal with anxiety and usage of community resources to ease the financial burden.  Pt report improved sleep patterns.  Pt will develop strategies to deal with anxiety and usage of community resources to ease the financial burden.  Pt report improved sleep patterns.  Pt will develop strategies to deal with anxiety and usage of community resources to ease the financial burden.  Pt report improved sleep patterns.  Pt will develop strategies to deal with anxiety and usage of community resources to ease the financial burden.  Pt report improved sleep patterns.   Interventions  Stress management education;Relaxation education;Encouraged to attend Pulmonary Rehabilitation for the exercise  Stress management education;Relaxation education;Encouraged to attend Pulmonary Rehabilitation for the exercise  Stress management education;Relaxation education;Encouraged to attend Pulmonary Rehabilitation for the exercise  Stress management education;Relaxation education;Encouraged to attend Pulmonary Rehabilitation for the exercise  Stress management education;Relaxation education;Encouraged to attend Pulmonary Rehabilitation for the exercise   Continue Psychosocial Services   Follow up required by staff  Follow up required by staff  Follow up required by staff  Follow up required by staff  Follow up required by staff      Psychosocial Discharge (Final Psychosocial Re-Evaluation): Psychosocial Re-Evaluation - 03/26/18 0730      Psychosocial Re-Evaluation   Current issues with  Current Sleep Concerns;Current Anxiety/Panic    Comments  Pt has financial anxiety due to caring for two school aged children, but has support from wife. Pt is an active participant in rehab and if often seen socializing with staff and other participants.    Expected Outcomes  Pt will develop strategies to deal with anxiety and usage of  community resources to ease the financial burden.  Pt report improved sleep patterns.    Interventions  Stress management education;Relaxation education;Encouraged to attend Pulmonary Rehabilitation for the exercise    Continue Psychosocial Services   Follow up required by staff       Education: Education Goals: Education classes  will be provided on a weekly basis, covering required topics. Participant will state understanding/return demonstration of topics presented.  Learning Barriers/Preferences: Learning Barriers/Preferences - 01/21/18 1358      Learning Barriers/Preferences   Learning Barriers  Hearing    Learning Preferences  Group Instruction;Individual Instruction;Written Material       Education Topics: Risk Factor Reduction:  -Group instruction that is supported by a PowerPoint presentation. Instructor discusses the definition of a risk factor, different risk factors for pulmonary disease, and how the heart and lungs work together.     Nutrition for Pulmonary Patient:  -Group instruction provided by PowerPoint slides, verbal discussion, and written materials to support subject matter. The instructor gives an explanation and review of healthy diet recommendations, which includes a discussion on weight management, recommendations for fruit and vegetable consumption, as well as protein, fluid, caffeine, fiber, sodium, sugar, and alcohol. Tips for eating when patients are short of breath are discussed.   PULMONARY REHAB CHRONIC OBSTRUCTIVE PULMONARY DISEASE from 03/21/2018 in [place]  Date  02/28/18  Educator  Rodman Pickle  Instruction Review Code  1- Verbalizes Understanding      Pursed Lip Breathing:  -Group instruction that is supported by demonstration and informational handouts. Instructor discusses the benefits of pursed lip and diaphragmatic breathing and detailed demonstration on how to preform both.     Oxygen Safety:  -Group instruction  provided by PowerPoint, verbal discussion, and written material to support subject matter. There is an overview of "What is Oxygen" and "Why do we need it".  Instructor also reviews how to create a safe environment for oxygen use, the importance of using oxygen as prescribed, and the risks of noncompliance. There is a brief discussion on traveling with oxygen and resources the patient may utilize.   PULMONARY REHAB CHRONIC OBSTRUCTIVE PULMONARY DISEASE from 03/21/2018 in [place]  Date  02/07/18  Educator  Remo Lipps  Instruction Review Code  1- Verbalizes Understanding      Oxygen Equipment:  -Group instruction provided by [employer] utilizing handouts, written materials, and Insurance underwriter.   PULMONARY REHAB CHRONIC OBSTRUCTIVE PULMONARY DISEASE from 03/21/2018 in [place]  Date  11/29/17  Educator  Ace Gins  Instruction Review Code  1- Verbalizes Understanding      Signs and Symptoms:  -Group instruction provided by written material and verbal discussion to support subject matter. Warning signs and symptoms of infection, stroke, and heart attack are reviewed and when to call the physician/911 reinforced. Tips for preventing the spread of infection discussed.   PULMONARY REHAB CHRONIC OBSTRUCTIVE PULMONARY DISEASE from 03/21/2018 in [place]  Date  02/21/18  Educator  Remo Lipps  Instruction Review Code  1- Verbalizes Understanding      Advanced Directives:  -Group instruction provided by verbal instruction and written material to support subject matter. Instructor reviews Advanced Directive laws and proper instruction for filling out document.   Pulmonary Video:  -Group video education that reviews the importance of medication and oxygen compliance, exercise, good nutrition, pulmonary hygiene, and pursed lip and diaphragmatic breathing for the pulmonary patient.   Exercise for  the Pulmonary Patient:  -Group instruction that is supported by a PowerPoint presentation. Instructor discusses benefits of exercise, core components of exercise, frequency, duration, and intensity of an exercise routine, importance of utilizing pulse oximetry during exercise, safety while exercising, and options of places to exercise outside of rehab.  Pulmonary Medications:  -Verbally interactive group education provided by instructor with focus on inhaled medications and proper administration.   PULMONARY REHAB CHRONIC OBSTRUCTIVE PULMONARY DISEASE from 03/21/2018 in [place]  Date  03/12/18  Educator  Pharmacist  Instruction Review Code  1- Verbalizes Understanding      Anatomy and Physiology of the Respiratory System and Intimacy:  -Group instruction provided by PowerPoint, verbal discussion, and written material to support subject matter. Instructor reviews respiratory cycle and anatomical components of the respiratory system and their functions. Instructor also reviews differences in obstructive and restrictive respiratory diseases with examples of each. Intimacy, Sex, and Sexuality differences are reviewed with a discussion on how relationships can change when diagnosed with pulmonary disease. Common sexual concerns are reviewed.   PULMONARY REHAB CHRONIC OBSTRUCTIVE PULMONARY DISEASE from 03/21/2018 in [place]  Date  03/21/18  Educator  rn  Instruction Review Code  2- Demonstrated Understanding      MD DAY -A group question and answer session with a medical doctor that allows participants to ask questions that relate to their pulmonary disease state.   OTHER EDUCATION -Group or individual verbal, written, or video instructions that support the educational goals of the pulmonary rehab program.   Holiday Eating Survival Tips:  -Group instruction provided by PowerPoint slides, verbal discussion, and written  materials to support subject matter. The instructor gives patients tips, tricks, and techniques to help them not only survive but enjoy the holidays despite the onslaught of food that accompanies the holidays.   Knowledge Questionnaire Score: Knowledge Questionnaire Score - 01/22/18 0857      Knowledge Questionnaire Score   Pre Score  15/18       Core Components/Risk Factors/Patient Goals at Admission: Personal Goals and Risk Factors at Admission - 01/21/18 1358      Core Components/Risk Factors/Patient Goals on Admission    Weight Management  Yes;Obesity    Intervention  Weight Management: Develop a combined nutrition and exercise program designed to reach desired caloric intake, while maintaining appropriate intake of nutrient and fiber, sodium and fats, and appropriate energy expenditure required for the weight goal.;Weight Management: Provide education and appropriate resources to help participant work on and attain dietary goals.;Weight Management/Obesity: Establish reasonable short term and long term weight goals.;Obesity: Provide education and appropriate resources to help participant work on and attain dietary goals.    Admit Weight  238 lb 12.1 oz (108.3 kg)    Goal Weight: Short Term  218 lb (98.9 kg)    Goal Weight: Long Term  200 lb (90.7 kg)    Expected Outcomes  Short Term: Continue to assess and modify interventions until short term weight is achieved;Long Term: Adherence to nutrition and physical activity/exercise program aimed toward attainment of established weight goal;Weight Loss: Understanding of general recommendations for a balanced deficit meal plan, which promotes 1-2 lb weight loss per week and includes a negative energy balance of [phone] kcal/d;Understanding recommendations for meals to include 15-35% energy as protein, 25-35% energy from fat, 35-60% energy from carbohydrates, less than 266m of dietary cholesterol, 20-35 gm of total fiber daily;Understanding of  distribution of calorie intake throughout the day with the consumption of 4-5 meals/snacks    Improve shortness of breath with ADL's  Yes    Intervention  Provide education, individualized exercise plan and daily activity instruction to help decrease symptoms of SOB with activities of daily living.    Expected Outcomes  Short Term: Improve  cardiorespiratory fitness to achieve a reduction of symptoms when performing ADLs;Long Term: Be able to perform more ADLs without symptoms or delay the onset of symptoms       Core Components/Risk Factors/Patient Goals Review:  Goals and Risk Factor Review    Row Name 10/09/17 0948 10/10/17 0929 11/07/17 1348 01/30/18 1658 02/28/18 1004     Core Components/Risk Factors/Patient Goals Review   Personal Goals Review  Weight Management/Obesity;Improve shortness of breath with ADL's;Increase knowledge of respiratory medications and ability to use respiratory devices properly.;Develop more efficient breathing techniques such as purse lipped breathing and diaphragmatic breathing and practicing self-pacing with activity.  Weight Management/Obesity;Improve shortness of breath with ADL's;Increase knowledge of respiratory medications and ability to use respiratory devices properly.;Develop more efficient breathing techniques such as purse lipped breathing and diaphragmatic breathing and practicing self-pacing with activity.  Weight Management/Obesity;Improve shortness of breath with ADL's;Increase knowledge of respiratory medications and ability to use respiratory devices properly.;Develop more efficient breathing techniques such as purse lipped breathing and diaphragmatic breathing and practicing self-pacing with activity.  Weight Management/Obesity;Improve shortness of breath with ADL's;Increase knowledge of respiratory medications and ability to use respiratory devices properly.;Develop more efficient breathing techniques such as purse lipped breathing and diaphragmatic  breathing and practicing self-pacing with activity.  Weight Management/Obesity;Improve shortness of breath with ADL's;Increase knowledge of respiratory medications and ability to use respiratory devices properly.;Develop more efficient breathing techniques such as purse lipped breathing and diaphragmatic breathing and practicing self-pacing with activity.   Review  Pt has completed 2 exercise sessions since 8/13.  Unable to adquately assess pt progress toward pulmonary goals. I anticipate in the next 30 days pt will show progress toward pulmonary goals.  -  Pt has completed 10  exercise sessions since 8/13.  Pt is begining to interact with fellow participants however pt interacts well with staff and often is joking with them.  Pt has attended respiratory medication class.  Pt needs additonal coaching for proper breathing techniques.  Pt with minimal weight loss of .3kg.  Pt has not been able to engage in home exercise out outside of rehab although he is [place] with his young grandchildren.  Hopefully as pt becomes more conditioned he will be able to begin some exercise.  Pt with increase on treadmill to 2/4/4, airydyne .9 and nustep at level 5I anticipate in the next 30 days pt will show progress toward pulmonary goals.  Pt to begin pulmonary rehab on 12/12.    Pt has completed 5 excercise sessions and 2 education sessions. Pt has began to see good weight loss with a decrease of 3.4 kg since beginning exercise on 12/17. He is able to demonstrate good PLB technique with staff promting. Pt will strive to be more independent with breathing technique. His workloads have already been increases to 2.0/2.0 on the treadmill, level 6 on the nustep and 0.8 on the airdyne. Will continue to monitor for improvements in the next 30 day review.   Expected Outcomes  See "admission goals"  See Admission Goals/Outcomes  See Admission Goals/Outcomes  See Admission Goals/Outcomes  see admission goals and outcomes   Row Name  03/25/18 0911             Core Components/Risk Factors/Patient Goals Review   Personal Goals Review  Weight Management/Obesity;Improve shortness of breath with ADL's;Increase knowledge of respiratory medications and ability to use respiratory devices properly.;Develop more efficient breathing techniques such as purse lipped breathing and diaphragmatic breathing and practicing self-pacing with activity.  Review  Pt has completed 10 excercise sessions and 5 education sessions. Pt has began to see good weight loss with a decrease of 3.0 kg since beginning exercise on 12/17, though he has had some mild fluctuation. He is able to demonstrate good PLB technique with staff promting. Pt will strive to be more independent with breathing technique. His workloads have remained 2.0/2.0 on the treadmill, level 5 on the nustep and has increased to 1.0 on the airdyne. Will continue to monitor for improvements in the next 30 day review.       Expected Outcomes  see admission goals and outcomes          Core Components/Risk Factors/Patient Goals at Discharge (Final Review):  Goals and Risk Factor Review - 03/25/18 0911      Core Components/Risk Factors/Patient Goals Review   Personal Goals Review  Weight Management/Obesity;Improve shortness of breath with ADL's;Increase knowledge of respiratory medications and ability to use respiratory devices properly.;Develop more efficient breathing techniques such as purse lipped breathing and diaphragmatic breathing and practicing self-pacing with activity.    Review  Pt has completed 10 excercise sessions and 5 education sessions. Pt has began to see good weight loss with a decrease of 3.0 kg since beginning exercise on 12/17, though he has had some mild fluctuation. He is able to demonstrate good PLB technique with staff promting. Pt will strive to be more independent with breathing technique. His workloads have remained 2.0/2.0 on the treadmill, level 5 on the nustep  and has increased to 1.0 on the airdyne. Will continue to monitor for improvements in the next 30 day review.    Expected Outcomes  see admission goals and outcomes       ITP Comments: ITP Comments    Row Name 10/10/17 9758 11/07/17 1345 01/21/18 1346 01/30/18 1658 02/28/18 1001   ITP Comments  Dr. Manfred Arch, Medical Director  Dr. Manfred Arch, Medical Director Pulmonary Rehab  Dr. Manfred Arch, Medical Director Pulmonary Rehab  Dr. Manfred Arch, Medical Director Pulmonary Rehab  Dr. Manfred Arch, Medical Director Pulmonary [place] Name 03/25/18 [phone]           ITP Comments  Dr. Manfred Arch, Medical Director Pulmonary Rehab          Comments: ITP REVIEW Pt is making expected progress toward personal goals after completing 10 sessions. Recommend continued exercise, life style modification, education, and utilization of breathing techniques to increase stamina and strength and decrease shortness of breath with exertion.  Joycelyn Man RN, BSN Cardiac and Pulmonary Rehab RN

## 2018-03-26 NOTE — Progress Notes (Deleted)
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 understand and use Dyspnea scale;Knowledge and understanding of Target Heart Rate Range (THRR);Understanding of Exercise Prescription  Increase Physical Activity;Increase Strength and Stamina;Able to understand and use rate of perceived exertion (RPE) scale;Able to understand and use Dyspnea scale;Knowledge and understanding of Target Heart Rate Range (THRR);Understanding of Exercise Prescription  Increase Physical Activity;Increase Strength and Stamina;Able to understand and use rate of perceived exertion (RPE) scale;Able to understand and use Dyspnea scale;Knowledge and understanding of Target Heart Rate Range (THRR);Understanding of Exercise Prescription  Increase Physical Activity;Increase Strength and Stamina;Able to understand and use rate of perceived exertion (RPE) scale;Able to  understand and use Dyspnea scale;Knowledge and understanding of Target Heart Rate Range (THRR);Understanding of Exercise Prescription   Comments  Patient has only attended 2 rehab sessions. Will cont. to monitor and motivate.  Patient is progressing well in rehab. His big goal is weight loss. Is active at home. Needs to concentrate on diet. Is open to workload changes.. Sometimes does desat-already on 4 liters (COPD). Will cont to monitor and motivate as able.   Patient is progressing well in rehab. His big goal is weight loss. Is active at home. Needs to concentrate on diet. Is open to workload changes.. Sometimes does desat-already on 4 liters (COPD). Will graduate from rehab today.   Pt just completed the program in October and was referred back. Pt has the ability to exercise at home, but lacks motivation. Pt gained quite a bit of weight and came back in deconditioned. Pt is currently exercising at 2.87 METs on the treadmill. I will be more aggressive with his progression as this is his second time through the program in such a short time. Will continue to monitor and progress as able.   Pt is progressing slower than I would like considering that he just completed the program a few months ago. I have sent a fax to Dr. Gwenette Greet from the [facility] seeking permission to engage pt in HIIT exercise while on the airdyne bike in an attempt to make the pt become more engaged. He is currently exercising at 3.08 METs while on the treadmill. Will continue to monitor and progress as able.    Expected Outcomes  Through exercise at home and at rehab, patient will increase physical capacity and ADL's will become easier to perform.   Through exercise at home and at rehab, patient will increase physical capacity and ADL's will become easier to perform.   Through exercise at home and at rehab, patient will increase physical capacity and ADL's will become easier to perform.   Through exercise at home and at rehab, patient  will increase physical capacity and ADL's will become easier to perform.   Through exercise at rehab and at home, the patient will decrease shortness of breath with daily activities and feel confident in carrying out an exercise regime at  home.       Discharge Exercise Prescription (Final Exercise Prescription Changes): Exercise Prescription Changes - 03/26/18 1200      Response to Exercise   Blood Pressure (Admit)  114/78    Blood Pressure (Exercise)  150/70    Blood Pressure (Exit)  120/60    Heart Rate (Admit)  75 bpm    Heart Rate (Exercise)  103 bpm    Heart Rate (Exit)  80 bpm    Oxygen Saturation (Admit)  98 %    Oxygen Saturation (Exercise)  94 %    Oxygen Saturation (Exit)  96 %    Rating of Perceived Exertion (Exercise)  11    Perceived Dyspnea (Exercise)  3    Duration  Progress to 45 minutes of aerobic exercise without signs/symptoms of physical distress    Intensity  THRR unchanged      Progression   Progression  Continue to progress workloads to maintain intensity without signs/symptoms of physical distress.      Resistance Training   Training Prescription  Yes    Weight  blue bands    Reps  10-15    Time  10 Minutes      Oxygen   Oxygen  Continuous    Liters  4      Treadmill   MPH  2.2    Grade  3    Minutes  17      Bike   Level  0.5    Minutes  17      NuStep   Level  5    SPM  80    Minutes  17    METs  2.1       Nutrition:  Target Goals: Understanding of nutrition guidelines, daily intake of sodium '1500mg'$ , cholesterol '200mg'$ , calories 30% from fat and 7% or less from saturated fats, daily to have 5 or more servings of fruits and vegetables.  Biometrics:    Nutrition Therapy Plan and Nutrition Goals: Nutrition Therapy & Goals - 02/21/18 1212      Nutrition Therapy   Diet  general healthful      Personal Nutrition Goals   Nutrition Goal  Identify food quantities necessary to achieve wt loss of  -2# per week to a goal wt loss of  2.7-10.9 kg (6-24 lb) at graduation from pulmonary rehab.    Personal Goal #2  Pt to identify and limit food sources of sodium.    Personal Goal #3  The pt will have family and friends shop for food when necessary so that nourishing foods are always available at home.      Intervention Plan   Intervention  Prescribe, educate and counsel regarding individualized specific dietary modifications aiming towards targeted core components such as weight, hypertension, lipid management, diabetes, heart failure and other comorbidities.    Expected Outcomes  Short Term Goal: Understand basic principles of dietary content, such as calories, fat, sodium, cholesterol and nutrients.;Long Term Goal: Adherence to prescribed nutrition plan.       Nutrition Assessments: Nutrition Assessments - 02/18/18 1514      Rate Your Plate Scores   Pre Score  37       Nutrition Goals Re-Evaluation: Nutrition Goals Re-Evaluation    Row Name 01/18/18 1723 02/21/18 1212           Goals   Current Weight  239 lb 6.7 oz (108.6 kg)  240 lb 11.9 oz (109.2 kg)      Nutrition Goal  Identify food quantities necessary to achieve wt loss of  -2# per week to a goal wt loss of 2.7-10.9 kg (6-24 lb) at graduation from pulmonary rehab.  -      Comment  weight loss goal not met, continue to encourage weight loss  -         Nutrition Goals Discharge (Final Nutrition Goals Re-Evaluation): Nutrition Goals Re-Evaluation - 02/21/18 1212      Goals   Current Weight  240 lb 11.9 oz (109.2 kg)       Psychosocial: Target Goals: Acknowledge presence or absence of significant depression and/or stress, maximize coping skills, provide positive support system. Participant is able to verbalize types and ability to use techniques and skills needed for reducing stress and depression.  Initial Review & Psychosocial Screening: Initial Psych Review & Screening - 01/21/18 1357      Initial Review   Current issues with  Current  Anxiety/Panic;Current Sleep Concerns      Family Dynamics   Good Support System?  Yes    Comments  lives with wife, two grand children      Barriers   Psychosocial barriers to participate in program  The patient should benefit from training in stress management and relaxation.      Screening Interventions   Interventions  Encouraged to exercise    Expected Outcomes  Long Term goal: The participant improves quality of Life and PHQ9 Scores as seen by post scores and/or verbalization of changes;Short Term goal: Identification and review with participant of any Quality of Life or Depression concerns found by scoring the questionnaire.;Short Term goal: Utilizing psychosocial counselor, staff and physician to assist with identification of specific Stressors or current issues interfering with healing process. Setting desired goal for each stressor or current issue identified.       Quality of Life Scores:  Scores of 19 and below usually indicate a poorer quality of life in these areas.  A difference of  2-3 points is a clinically meaningful difference.  A difference of 2-3 points in the total score of the Quality of Life Index has been associated with significant improvement in overall quality of life, self-image, physical symptoms, and general health in studies assessing change in quality of life.  PHQ-9: Recent Review Flowsheet Data    Depression screen [facility] 2/9 01/21/2018 12/04/2017 09/19/2017   Decreased Interest 0 0 0   Down, Depressed, Hopeless 1 0 1   PHQ - 2 Score 1 0 1   Altered sleeping '3 3 3   '$ Tired, decreased energy '3 3 3   '$ Change in appetite 1 0 0   Feeling bad or failure about yourself  '1 1 1   '$ Trouble concentrating '1 1 3   '$ Moving slowly or fidgety/restless '1 1 1   '$ Suicidal thoughts 0 0 1   PHQ-9 Score '11 9 13   '$ Difficult doing work/chores Somewhat difficult Somewhat difficult Very difficult     Interpretation of Total Score  Total Score Depression Severity:  1-4 = Minimal  depression, 5-9 = Mild depression, 10-14 = Moderate depression, 15-19 = Moderately severe depression, 20-27 = Severe depression   Psychosocial Evaluation and Intervention: Psychosocial Evaluation - 03/25/18 0917      Psychosocial Evaluation & Interventions   Interventions  Stress management education;Relaxation education;Encouraged to exercise with the program and follow exercise prescription    Comments  Pt receives therapy for PTSD, medication for anxiety. Continue to support pt in anyway    Expected Outcomes  Pt will demonstrate positive healthy coping skills     Continue Psychosocial Services   Follow up required by staff       Psychosocial Re-Evaluation: Psychosocial Re-Evaluation    [place] Name 10/09/17 0943 11/07/17 1346 01/30/18 1658 02/28/18 1003 03/26/18 0730     Psychosocial Re-Evaluation   Current issues with  Current Sleep Concerns;Current Anxiety/Panic  Current Sleep Concerns;Current Anxiety/Panic  Current Sleep Concerns;Current Anxiety/Panic  Current Sleep Concerns;Current Anxiety/Panic  Current Sleep Concerns;Current Anxiety/Panic   Comments  Pt has financial anxiety due to caring for two 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  With back to school supplies and clothing needed this is difficult for pt to manage  Pt has financial anxiety due to caring for two 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  With back to school supplies and clothing needed this is difficult for pt to manage  Pt has financial anxiety due to caring for two 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  With back to school supplies and clothing needed this is difficult for pt to manage  Pt has financial anxiety due to caring for two school aged children, but has support from wife   Pt has financial anxiety due to caring for two school aged children, but has support from wife. Pt is an active participant in rehab and if often seen socializing with staff and other participants.   Expected Outcomes  Pt will develop strategies to deal with anxiety and usage of community  resources to ease the financial burden.  Pt report improved sleep patterns.  Pt will develop strategies to deal with anxiety and usage of community resources to ease the financial burden.  Pt report improved sleep patterns.  Pt will develop strategies to deal with anxiety and usage of community resources to ease the financial burden.  Pt report improved sleep patterns.  Pt will develop strategies to deal with anxiety and usage of community resources to ease the financial burden.  Pt report improved sleep patterns.  Pt will develop strategies to deal with anxiety and usage of community resources to ease the financial burden.  Pt report improved sleep patterns.   Interventions  Stress management education;Relaxation education;Encouraged to attend Pulmonary Rehabilitation for the exercise  Stress management education;Relaxation education;Encouraged to attend Pulmonary Rehabilitation for the exercise  Stress management education;Relaxation education;Encouraged to attend Pulmonary Rehabilitation for the exercise  Stress management education;Relaxation education;Encouraged to attend Pulmonary Rehabilitation for the exercise  Stress management education;Relaxation education;Encouraged to attend Pulmonary Rehabilitation for the exercise   Continue Psychosocial Services   Follow up required by staff  Follow up required by staff  Follow up required by staff  Follow up required by staff  Follow up required by staff      Psychosocial Discharge (Final Psychosocial Re-Evaluation): Psychosocial Re-Evaluation - 03/26/18 0730      Psychosocial Re-Evaluation   Current issues with  Current Sleep Concerns;Current Anxiety/Panic    Comments  Pt has financial anxiety due to caring for two school aged children, but has support from wife. Pt is an active participant in rehab and if often seen socializing with staff and other participants.    Expected Outcomes  Pt will develop strategies to deal with anxiety and usage of  community resources to ease the financial burden.  Pt report improved sleep patterns.    Interventions  Stress management education;Relaxation education;Encouraged to attend Pulmonary Rehabilitation for the exercise    Continue Psychosocial Services   Follow up required by staff       Education: Education Goals: Education classes will be provided  on a weekly basis, covering required topics. Participant will state understanding/return demonstration of topics presented.  Learning Barriers/Preferences: Learning Barriers/Preferences - 01/21/18 1358      Learning Barriers/Preferences   Learning Barriers  Hearing    Learning Preferences  Group Instruction;Individual Instruction;Written Material       Education Topics: Risk Factor Reduction:  -Group instruction that is supported by a PowerPoint presentation. Instructor discusses the definition of a risk factor, different risk factors for pulmonary disease, and how the heart and lungs work together.     Nutrition for Pulmonary Patient:  -Group instruction provided by PowerPoint slides, verbal discussion, and written materials to support subject matter. The instructor gives an explanation and review of healthy diet recommendations, which includes a discussion on weight management, recommendations for fruit and vegetable consumption, as well as protein, fluid, caffeine, fiber, sodium, sugar, and alcohol. Tips for eating when patients are short of breath are discussed.   PULMONARY REHAB CHRONIC OBSTRUCTIVE PULMONARY DISEASE from 03/21/2018 in [place]  Date  02/28/18  Educator  Rodman Pickle  Instruction Review Code  1- Verbalizes Understanding      Pursed Lip Breathing:  -Group instruction that is supported by demonstration and informational handouts. Instructor discusses the benefits of pursed lip and diaphragmatic breathing and detailed demonstration on how to preform both.     Oxygen Safety:  -Group instruction  provided by PowerPoint, verbal discussion, and written material to support subject matter. There is an overview of "What is Oxygen" and "Why do we need it".  Instructor also reviews how to create a safe environment for oxygen use, the importance of using oxygen as prescribed, and the risks of noncompliance. There is a brief discussion on traveling with oxygen and resources the patient may utilize.   PULMONARY REHAB CHRONIC OBSTRUCTIVE PULMONARY DISEASE from 03/21/2018 in [place]  Date  02/07/18  Educator  Remo Lipps  Instruction Review Code  1- Verbalizes Understanding      Oxygen Equipment:  -Group instruction provided by [employer] utilizing handouts, written materials, and Insurance underwriter.   PULMONARY REHAB CHRONIC OBSTRUCTIVE PULMONARY DISEASE from 03/21/2018 in [place]  Date  11/29/17  Educator  Ace Gins  Instruction Review Code  1- Verbalizes Understanding      Signs and Symptoms:  -Group instruction provided by written material and verbal discussion to support subject matter. Warning signs and symptoms of infection, stroke, and heart attack are reviewed and when to call the physician/911 reinforced. Tips for preventing the spread of infection discussed.   PULMONARY REHAB CHRONIC OBSTRUCTIVE PULMONARY DISEASE from 03/21/2018 in [place]  Date  02/21/18  Educator  Remo Lipps  Instruction Review Code  1- Verbalizes Understanding      Advanced Directives:  -Group instruction provided by verbal instruction and written material to support subject matter. Instructor reviews Advanced Directive laws and proper instruction for filling out document.   Pulmonary Video:  -Group video education that reviews the importance of medication and oxygen compliance, exercise, good nutrition, pulmonary hygiene, and pursed lip and diaphragmatic breathing for the pulmonary patient.   Exercise for  the Pulmonary Patient:  -Group instruction that is supported by a PowerPoint presentation. Instructor discusses benefits of exercise, core components of exercise, frequency, duration, and intensity of an exercise routine, importance of utilizing pulse oximetry during exercise, safety while exercising, and options of places to exercise outside of rehab.     Pulmonary Medications:  -  Verbally interactive group education provided by instructor with focus on inhaled medications and proper administration.   PULMONARY REHAB CHRONIC OBSTRUCTIVE PULMONARY DISEASE from 03/21/2018 in [place]  Date  03/12/18  Educator  Pharmacist  Instruction Review Code  1- Verbalizes Understanding      Anatomy and Physiology of the Respiratory System and Intimacy:  -Group instruction provided by PowerPoint, verbal discussion, and written material to support subject matter. Instructor reviews respiratory cycle and anatomical components of the respiratory system and their functions. Instructor also reviews differences in obstructive and restrictive respiratory diseases with examples of each. Intimacy, Sex, and Sexuality differences are reviewed with a discussion on how relationships can change when diagnosed with pulmonary disease. Common sexual concerns are reviewed.   PULMONARY REHAB CHRONIC OBSTRUCTIVE PULMONARY DISEASE from 03/21/2018 in [place]  Date  03/21/18  Educator  rn  Instruction Review Code  2- Demonstrated Understanding      MD DAY -A group question and answer session with a medical doctor that allows participants to ask questions that relate to their pulmonary disease state.   OTHER EDUCATION -Group or individual verbal, written, or video instructions that support the educational goals of the pulmonary rehab program.   Holiday Eating Survival Tips:  -Group instruction provided by PowerPoint slides, verbal discussion, and written  materials to support subject matter. The instructor gives patients tips, tricks, and techniques to help them not only survive but enjoy the holidays despite the onslaught of food that accompanies the holidays.   Knowledge Questionnaire Score: Knowledge Questionnaire Score - 01/22/18 0857      Knowledge Questionnaire Score   Pre Score  15/18       Core Components/Risk Factors/Patient Goals at Admission: Personal Goals and Risk Factors at Admission - 01/21/18 1358      Core Components/Risk Factors/Patient Goals on Admission    Weight Management  Yes;Obesity    Intervention  Weight Management: Develop a combined nutrition and exercise program designed to reach desired caloric intake, while maintaining appropriate intake of nutrient and fiber, sodium and fats, and appropriate energy expenditure required for the weight goal.;Weight Management: Provide education and appropriate resources to help participant work on and attain dietary goals.;Weight Management/Obesity: Establish reasonable short term and long term weight goals.;Obesity: Provide education and appropriate resources to help participant work on and attain dietary goals.    Admit Weight  238 lb 12.1 oz (108.3 kg)    Goal Weight: Short Term  218 lb (98.9 kg)    Goal Weight: Long Term  200 lb (90.7 kg)    Expected Outcomes  Short Term: Continue to assess and modify interventions until short term weight is achieved;Long Term: Adherence to nutrition and physical activity/exercise program aimed toward attainment of established weight goal;Weight Loss: Understanding of general recommendations for a balanced deficit meal plan, which promotes 1-2 lb weight loss per week and includes a negative energy balance of [phone] kcal/d;Understanding recommendations for meals to include 15-35% energy as protein, 25-35% energy from fat, 35-60% energy from carbohydrates, less than '200mg'$  of dietary cholesterol, 20-35 gm of total fiber daily;Understanding of  distribution of calorie intake throughout the day with the consumption of 4-5 meals/snacks    Improve shortness of breath with ADL's  Yes    Intervention  Provide education, individualized exercise plan and daily activity instruction to help decrease symptoms of SOB with activities of daily living.    Expected Outcomes  Short Term: Improve cardiorespiratory fitness to  achieve a reduction of symptoms when performing ADLs;Long Term: Be able to perform more ADLs without symptoms or delay the onset of symptoms       Core Components/Risk Factors/Patient Goals Review:  Goals and Risk Factor Review    Row Name 10/09/17 0948 10/10/17 0929 11/07/17 1348 01/30/18 1658 02/28/18 1004     Core Components/Risk Factors/Patient Goals Review   Personal Goals Review  Weight Management/Obesity;Improve shortness of breath with ADL's;Increase knowledge of respiratory medications and ability to use respiratory devices properly.;Develop more efficient breathing techniques such as purse lipped breathing and diaphragmatic breathing and practicing self-pacing with activity.  Weight Management/Obesity;Improve shortness of breath with ADL's;Increase knowledge of respiratory medications and ability to use respiratory devices properly.;Develop more efficient breathing techniques such as purse lipped breathing and diaphragmatic breathing and practicing self-pacing with activity.  Weight Management/Obesity;Improve shortness of breath with ADL's;Increase knowledge of respiratory medications and ability to use respiratory devices properly.;Develop more efficient breathing techniques such as purse lipped breathing and diaphragmatic breathing and practicing self-pacing with activity.  Weight Management/Obesity;Improve shortness of breath with ADL's;Increase knowledge of respiratory medications and ability to use respiratory devices properly.;Develop more efficient breathing techniques such as purse lipped breathing and diaphragmatic  breathing and practicing self-pacing with activity.  Weight Management/Obesity;Improve shortness of breath with ADL's;Increase knowledge of respiratory medications and ability to use respiratory devices properly.;Develop more efficient breathing techniques such as purse lipped breathing and diaphragmatic breathing and practicing self-pacing with activity.   Review  Pt has completed 2 exercise sessions since 8/13.  Unable to adquately assess pt progress toward pulmonary goals. I anticipate in the next 30 days pt will show progress toward pulmonary goals.  -  Pt has completed 10  exercise sessions since 8/13.  Pt is begining to interact with fellow participants however pt interacts well with staff and often is joking with them.  Pt has attended respiratory medication class.  Pt needs additonal coaching for proper breathing techniques.  Pt with minimal weight loss of .3kg.  Pt has not been able to engage in home exercise out outside of rehab although he is [place] with his young grandchildren.  Hopefully as pt becomes more conditioned he will be able to begin some exercise.  Pt with increase on treadmill to 2/4/4, airydyne .9 and nustep at level 5I anticipate in the next 30 days pt will show progress toward pulmonary goals.  Pt to begin pulmonary rehab on 12/12.    Pt has completed 5 excercise sessions and 2 education sessions. Pt has began to see good weight loss with a decrease of 3.4 kg since beginning exercise on 12/17. He is able to demonstrate good PLB technique with staff promting. Pt will strive to be more independent with breathing technique. His workloads have already been increases to 2.0/2.0 on the treadmill, level 6 on the nustep and 0.8 on the airdyne. Will continue to monitor for improvements in the next 30 day review.   Expected Outcomes  See "admission goals"  See Admission Goals/Outcomes  See Admission Goals/Outcomes  See Admission Goals/Outcomes  see admission goals and outcomes   Row Name  03/25/18 0911             Core Components/Risk Factors/Patient Goals Review   Personal Goals Review  Weight Management/Obesity;Improve shortness of breath with ADL's;Increase knowledge of respiratory medications and ability to use respiratory devices properly.;Develop more efficient breathing techniques such as purse lipped breathing and diaphragmatic breathing and practicing self-pacing with activity.  Review  Pt has completed 10 excercise sessions and 5 education sessions. Pt has began to see good weight loss with a decrease of 3.0 kg since beginning exercise on 12/17, though he has had some mild fluctuation. He is able to demonstrate good PLB technique with staff promting. Pt will strive to be more independent with breathing technique. His workloads have remained 2.0/2.0 on the treadmill, level 5 on the nustep and has increased to 1.0 on the airdyne. Will continue to monitor for improvements in the next 30 day review.       Expected Outcomes  see admission goals and outcomes          Core Components/Risk Factors/Patient Goals at Discharge (Final Review):  Goals and Risk Factor Review - 03/25/18 0911      Core Components/Risk Factors/Patient Goals Review   Personal Goals Review  Weight Management/Obesity;Improve shortness of breath with ADL's;Increase knowledge of respiratory medications and ability to use respiratory devices properly.;Develop more efficient breathing techniques such as purse lipped breathing and diaphragmatic breathing and practicing self-pacing with activity.    Review  Pt has completed 10 excercise sessions and 5 education sessions. Pt has began to see good weight loss with a decrease of 3.0 kg since beginning exercise on 12/17, though he has had some mild fluctuation. He is able to demonstrate good PLB technique with staff promting. Pt will strive to be more independent with breathing technique. His workloads have remained 2.0/2.0 on the treadmill, level 5 on the nustep  and has increased to 1.0 on the airdyne. Will continue to monitor for improvements in the next 30 day review.    Expected Outcomes  see admission goals and outcomes       ITP Comments: ITP Comments    Row Name 10/10/17 4888 11/07/17 1345 01/21/18 1346 01/30/18 1658 02/28/18 1001   ITP Comments  Dr. Manfred Arch, Medical Director  Dr. Manfred Arch, Medical Director Pulmonary Rehab  Dr. Manfred Arch, Medical Director Pulmonary Rehab  Dr. Manfred Arch, Medical Director Pulmonary Rehab  Dr. Manfred Arch, Medical Director Pulmonary [place] Name 03/25/18 [phone]           ITP Comments  Dr. Manfred Arch, Medical Director Pulmonary Rehab          Comments: ITP REVIEW Pt is making expected progress toward pulmonary rehab goals after completing 14 sessions. Recommend continued exercise, life style modification, education, and utilization of breathing techniques to increase stamina and strength and decrease shortness of breath with exertion.

## 2018-03-26 NOTE — Progress Notes (Signed)
Daily Session Note  Patient Details  Name: ERICH KOCHAN MRN: [id number] Date of Birth: 25-Dec-1960 Referring Provider:     Pulmonary Rehab Walk Test from 01/22/2018 in [place]  Referring Provider  Dr. Nelda Marseille      Encounter Date: 03/26/2018  Check In: Session Check In - 03/26/18 1032      Check-In   Supervising physician immediately available to respond to emergencies  [facility] immediately available    Physician(s)  Dr. Tawanna Solo    Location  [facility]    Staff Present  Joycelyn Man RN, Maxcine Ham, RN, BSN;Carlette Wilber Oliphant, RN, Bjorn Loser, MS, Exercise Physiologist    Medication changes reported      No    Fall or balance concerns reported     No    Tobacco Cessation  No Change    Warm-up and Cool-down  Performed as group-led instruction    Resistance Training Performed  Yes    VAD Patient?  No    PAD/SET Patient?  No      Pain Assessment   Currently in Pain?  No/denies    Pain Score  0-No pain    Multiple Pain Sites  No       Capillary Blood Glucose: No results found for this or any previous visit (from the past 24 hour(s)).  Exercise Prescription Changes - 03/26/18 1200      Response to Exercise   Blood Pressure (Admit)  114/78    Blood Pressure (Exercise)  150/70    Blood Pressure (Exit)  120/60    Heart Rate (Admit)  75 bpm    Heart Rate (Exercise)  103 bpm    Heart Rate (Exit)  80 bpm    Oxygen Saturation (Admit)  98 %    Oxygen Saturation (Exercise)  94 %    Oxygen Saturation (Exit)  96 %    Rating of Perceived Exertion (Exercise)  11    Perceived Dyspnea (Exercise)  3    Duration  Progress to 45 minutes of aerobic exercise without signs/symptoms of physical distress    Intensity  THRR unchanged      Progression   Progression  Continue to progress workloads to maintain intensity without signs/symptoms of physical distress.      Resistance Training   Training Prescription  Yes     Weight  blue bands    Reps  10-15    Time  10 Minutes      Oxygen   Oxygen  Continuous    Liters  4      Treadmill   MPH  2.2    Grade  3    Minutes  17      Bike   Level  0.5    Minutes  17      NuStep   Level  5    SPM  80    Minutes  17    METs  2.1       Social History   Tobacco Use  Smoking Status Former Smoker  . Packs/day: 2.00  . Years: 30.00  . Pack years: 60.00  . Types: Cigarettes  . Last attempt to quit: 04/08/2013  . Years since quitting: 4.9  Smokeless Tobacco Never Used    Goals Met:  Proper associated with RPD/PD & O2 Sat Changing diet to healthy choices, watching portion sizes Strength training completed today  Goals Unmet:  Not Applicable  Comments: Service time is from  1030 to 1210   Dr. Rush Farmer is Medical Director for Pulmonary Rehab at [facility].

## 2018-03-28 ENCOUNTER — Encounter (HOSPITAL_COMMUNITY): Admission: RE | Admit: 2018-03-28 | Payer: No Typology Code available for payment source | Source: Ambulatory Visit

## 2018-04-02 ENCOUNTER — Encounter (HOSPITAL_COMMUNITY): Payer: No Typology Code available for payment source

## 2018-04-04 ENCOUNTER — Encounter (HOSPITAL_COMMUNITY)
Admission: RE | Admit: 2018-04-04 | Discharge: 2018-04-04 | Disposition: A | Discharge status: Home / Self Care | Payer: No Typology Code available for payment source | Source: Ambulatory Visit | Attending: Pulmonary Disease | Admitting: Pulmonary Disease

## 2018-04-04 DIAGNOSIS — J438 Other emphysema: Secondary | ICD-10-CM

## 2018-04-04 NOTE — Progress Notes (Signed)
Daily Session Note  Patient Details  Name: Vincent Sutton MRN: [id number] Date of Birth: 1960-08-18 Referring Provider:     Pulmonary Rehab Walk Test from 01/22/2018 in [place]  Referring Provider  Dr. Nelda Marseille      Encounter Date: 04/04/2018  Check In: Session Check In - 04/04/18 1030      Check-In   Supervising physician immediately available to respond to emergencies  [facility] immediately available    Physician(s)  Dr. Berle Mull    Location  [facility]    Staff Present  Rosebud Poles, RN, BSN;Carlette Carlton, RN, BSN;Lisa Hughes, RN;Molly DiVincenzo, MS, ACSM RCEP, Exercise Physiologist;Dalton Kris Mouton, MS, Exercise Physiologist    Medication changes reported      No    Fall or balance concerns reported     No    Tobacco Cessation  No Change    Warm-up and Cool-down  Performed as group-led instruction    Resistance Training Performed  Yes    VAD Patient?  No    PAD/SET Patient?  No      Pain Assessment   Currently in Pain?  No/denies    Multiple Pain Sites  No       Capillary Blood Glucose: No results found for this or any previous visit (from the past 24 hour(s)).    Social History   Tobacco Use  Smoking Status Former Smoker  . Packs/day: 2.00  . Years: 30.00  . Pack years: 60.00  . Types: Cigarettes  . Last attempt to quit: 04/08/2013  . Years since quitting: 4.9  Smokeless Tobacco Never Used    Goals Met:  Proper associated with RPD/PD & O2 Sat Exercise tolerated well Strength training completed today  Goals Unmet:  Not Applicable  Comments: Service time is from 1030 to 1215    Dr. Rush Farmer is Medical Director for Pulmonary Rehab at [facility].

## 2018-04-09 ENCOUNTER — Encounter (HOSPITAL_COMMUNITY)
Admission: RE | Admit: 2018-04-09 | Discharge: 2018-04-09 | Disposition: A | Discharge status: Home / Self Care | Payer: No Typology Code available for payment source | Source: Ambulatory Visit | Attending: Pulmonary Disease | Admitting: Pulmonary Disease

## 2018-04-09 VITALS — Wt 241.8 lb

## 2018-04-09 DIAGNOSIS — J438 Other emphysema: Secondary | ICD-10-CM | POA: Diagnosis not present

## 2018-04-09 DIAGNOSIS — J449 Chronic obstructive pulmonary disease, unspecified: Secondary | ICD-10-CM

## 2018-04-09 NOTE — Progress Notes (Signed)
Daily Session Note  Patient Details  Name: Vincent Sutton MRN: [id number] Date of Birth: [birth year] Referring Provider:     Pulmonary Rehab Walk Test from 01/22/2018 in [place]  Referring Provider  Dr. Nelda Marseille      Encounter Date: 04/09/2018  Check In: Session Check In - 04/09/18 1206      Check-In   Supervising physician immediately available to respond to emergencies  [facility] immediately available    Physician(s)  Dr. Tawanna Solo    Location  [facility]    Staff Present  Rosebud Poles, RN, BSN;Carlette Wilber Oliphant, RN, BSN;Lisa Ysidro Evert, RN;Courtnie Brenes, MS, ACSM RCEP, Exercise Physiologist;Dalton Kris Mouton, MS, Exercise Physiologist    Medication changes reported      No    Fall or balance concerns reported     No    Tobacco Cessation  No Change    Warm-up and Cool-down  Not performed (comment)    Resistance Training Performed  Yes    VAD Patient?  No    PAD/SET Patient?  No      Pain Assessment   Currently in Pain?  No/denies    Multiple Pain Sites  No       Capillary Blood Glucose: No results found for this or any previous visit (from the past 24 hour(s)).  Exercise Prescription Changes - 04/09/18 1200      Response to Exercise   Blood Pressure (Admit)  130/80    Blood Pressure (Exercise)  150/78    Blood Pressure (Exit)  110/70    Heart Rate (Admit)  81 bpm    Heart Rate (Exercise)  120 bpm    Heart Rate (Exit)  98 bpm    Oxygen Saturation (Admit)  96 %    Oxygen Saturation (Exercise)  90 %    Oxygen Saturation (Exit)  96 %    Rating of Perceived Exertion (Exercise)  14    Perceived Dyspnea (Exercise)  3    Duration  Progress to 45 minutes of aerobic exercise without signs/symptoms of physical distress    Intensity  THRR unchanged      Progression   Progression  Continue to progress workloads to maintain intensity without signs/symptoms of physical distress.      Resistance Training   Training  Prescription  Yes    Weight  blue bands    Reps  10-15    Time  10 Minutes      Oxygen   Oxygen  Continuous    Liters  4      Treadmill   MPH  2.2    Grade  3    Minutes  17      Bike   Level  1    Minutes  17      NuStep   Level  5    SPM  80    Minutes  17    METs  2       Social History   Tobacco Use  Smoking Status Former Smoker  . Packs/day: 2.00  . Years: 30.00  . Pack years: 60.00  . Types: Cigarettes  . Last attempt to quit: 04/08/2013  . Years since quitting: 5.0  Smokeless Tobacco Never Used    Goals Met:  Exercise tolerated well  Goals Unmet:  Not Applicable  Comments: Service time is from 10:30a to 12:00p    Dr. Rush Farmer is Medical Director for Pulmonary Rehab at [facility].

## 2018-04-11 ENCOUNTER — Encounter (HOSPITAL_COMMUNITY)
Admission: RE | Admit: 2018-04-11 | Discharge: 2018-04-11 | Disposition: A | Discharge status: Home / Self Care | Payer: No Typology Code available for payment source | Source: Ambulatory Visit | Attending: Pulmonary Disease | Admitting: Pulmonary Disease

## 2018-04-11 DIAGNOSIS — J438 Other emphysema: Secondary | ICD-10-CM | POA: Diagnosis not present

## 2018-04-11 DIAGNOSIS — J449 Chronic obstructive pulmonary disease, unspecified: Secondary | ICD-10-CM

## 2018-04-11 NOTE — Progress Notes (Signed)
Daily Session Note  Patient Details  Name: Vincent Sutton MRN: [id number] Date of Birth: 10/10/1960 Referring Provider:     Pulmonary Rehab Walk Test from 01/22/2018 in [place]  Referring Provider  Dr. Nelda Marseille      Encounter Date: 04/11/2018  Check In: Session Check In - 04/11/18 1030      Check-In   Supervising physician immediately available to respond to emergencies  [facility] immediately available    Physician(s)  Dr. Herbert Moors    Location  [facility]    Staff Present  Rosebud Poles, RN, BSN;Lisa Ysidro Evert, RN;Dalton Kris Mouton, MS, Exercise Physiologist;Molly DiVincenzo, MS, ACSM RCEP, Exercise Physiologist    Medication changes reported      No    Fall or balance concerns reported     No    Tobacco Cessation  No Change    Warm-up and Cool-down  Performed as group-led instruction    Resistance Training Performed  Yes    VAD Patient?  No    PAD/SET Patient?  No      Pain Assessment   Currently in Pain?  No/denies    Multiple Pain Sites  No       Capillary Blood Glucose: No results found for this or any previous visit (from the past 24 hour(s)).    Social History   Tobacco Use  Smoking Status Former Smoker  . Packs/day: 2.00  . Years: 30.00  . Pack years: 60.00  . Types: Cigarettes  . Last attempt to quit: 04/08/2013  . Years since quitting: 5.0  Smokeless Tobacco Never Used    Goals Met:  Proper associated with RPD/PD & O2 Sat Exercise tolerated well Strength training completed today  Goals Unmet:  Not Applicable  Comments: Service time is from 1030 to 1200    Dr. Rush Farmer is Medical Director for Pulmonary Rehab at [facility].

## 2018-04-16 ENCOUNTER — Encounter (HOSPITAL_COMMUNITY)
Admission: RE | Admit: 2018-04-16 | Discharge: 2018-04-16 | Disposition: A | Discharge status: Home / Self Care | Payer: No Typology Code available for payment source | Source: Ambulatory Visit | Attending: Pulmonary Disease | Admitting: Pulmonary Disease

## 2018-04-16 DIAGNOSIS — J438 Other emphysema: Secondary | ICD-10-CM | POA: Diagnosis not present

## 2018-04-16 DIAGNOSIS — J449 Chronic obstructive pulmonary disease, unspecified: Secondary | ICD-10-CM

## 2018-04-16 NOTE — Progress Notes (Signed)
Daily Session Note  Patient Details  Name: Vincent Sutton MRN: [id number] Date of Birth: 02/25/1960 Referring Provider:     Pulmonary Rehab Walk Test from 01/22/2018 in [facility]  Referring Provider  Dr. Yacoub      Encounter Date: 04/16/2018  Check In: Session Check In - 04/16/18 1159      Check-In   Supervising physician immediately available to respond to emergencies  [facility] immediately available    Physician(s)  Dr. Nettey    Location  [facility]    Staff Present  Joan Behrens, RN, BSN;Molly DiVincenzo, MS, ACSM RCEP, Exercise Physiologist;Dalton Fletcher, MS, Exercise Physiologist    Medication changes reported      No    Fall or balance concerns reported     No    Tobacco Cessation  No Change    Warm-up and Cool-down  Not performed (comment)    Resistance Training Performed  Yes    VAD Patient?  No    PAD/SET Patient?  No      Pain Assessment   Currently in Pain?  No/denies    Multiple Pain Sites  No       Capillary Blood Glucose: No results found for this or any previous visit (from the past 24 hour(s)).    Social History   Tobacco Use  Smoking Status Former Smoker  . Packs/day: 2.00  . Years: 30.00  . Pack years: 60.00  . Types: Cigarettes  . Last attempt to quit: 04/08/2013  . Years since quitting: 5.0  Smokeless Tobacco Never Used    Goals Met:  Personal goals reviewed  Goals Unmet:  Not Applicable  Comments: Service time is from 10:30a to 12:00p    Dr. Wesam G. Yacoub is Medical Director for Pulmonary Rehab at [facility]. 

## 2018-04-18 ENCOUNTER — Encounter (HOSPITAL_COMMUNITY)
Admission: RE | Admit: 2018-04-18 | Discharge: 2018-04-18 | Disposition: A | Discharge status: Home / Self Care | Payer: No Typology Code available for payment source | Source: Ambulatory Visit | Attending: Pulmonary Disease | Admitting: Pulmonary Disease

## 2018-04-18 DIAGNOSIS — J438 Other emphysema: Secondary | ICD-10-CM

## 2018-04-18 DIAGNOSIS — J449 Chronic obstructive pulmonary disease, unspecified: Secondary | ICD-10-CM

## 2018-04-18 NOTE — Progress Notes (Signed)
Daily Session Note  Patient Details  Name: Vincent Sutton MRN: [id number] Date of Birth: 05-12-1960 Referring Provider:     Pulmonary Rehab Walk Test from 01/22/2018 in [place]  Referring Provider  Dr. Nelda Marseille      Encounter Date: 04/18/2018  Check In: Session Check In - 04/18/18 1041      Check-In   Supervising physician immediately available to respond to emergencies  [facility] immediately available    Physician(s)  Dr. Dessa Phi    Location  [facility]    Staff Present  Rosebud Poles, RN, BSN;Molly DiVincenzo, MS, ACSM RCEP, Exercise Physiologist;Dalton Kris Mouton, MS, Exercise Physiologist;Carlette Wilber Oliphant, RN, Roque Cash, RN    Medication changes reported      No    Fall or balance concerns reported     No    Tobacco Cessation  No Change    Warm-up and Cool-down  Performed as group-led instruction    Resistance Training Performed  Yes    VAD Patient?  No    PAD/SET Patient?  No      Pain Assessment   Currently in Pain?  No/denies       Capillary Blood Glucose: No results found for this or any previous visit (from the past 24 hour(s)).    Social History   Tobacco Use  Smoking Status Former Smoker  . Packs/day: 2.00  . Years: 30.00  . Pack years: 60.00  . Types: Cigarettes  . Last attempt to quit: 04/08/2013  . Years since quitting: 5.0  Smokeless Tobacco Never Used    Goals Met:  Exercise tolerated well No report of cardiac concerns or symptoms Strength training completed today  Goals Unmet:  Not Applicable  Comments: Service time is from 1030 to 1200    Dr. Rush Farmer is Medical Director for Pulmonary Rehab at [facility].

## 2018-04-22 ENCOUNTER — Encounter (HOSPITAL_COMMUNITY): Payer: Self-pay

## 2018-04-22 NOTE — Progress Notes (Signed)
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      Intervention Plan   Intervention  Prescribe, educate and counsel regarding individualized specific dietary modifications aiming towards targeted core components such as weight, hypertension, lipid management, diabetes, heart failure and other comorbidities.    Expected Outcomes  Short Term Goal: Understand basic principles of dietary content, such as calories, fat, sodium, cholesterol and nutrients.;Long Term Goal: Adherence to prescribed nutrition plan.       Nutrition Assessments: Nutrition Assessments - 02/18/18 1514      Rate Your Plate Scores   Pre Score  37       Nutrition Goals Re-Evaluation: Nutrition Goals Re-Evaluation    Row Name 02/21/18 1212             Goals   Current Weight  240 lb 11.9 oz (109.2 kg)          Nutrition Goals Discharge (Final Nutrition Goals Re-Evaluation): Nutrition Goals  Re-Evaluation - 02/21/18 1212      Goals   Current Weight  240 lb 11.9 oz (109.2 kg)       Psychosocial: Target Goals: Acknowledge presence or absence of significant depression and/or stress, maximize coping skills, provide positive support system. Participant is able to verbalize types and ability to use techniques and skills needed for reducing stress and depression.  Initial Review & Psychosocial Screening: Initial Psych Review & Screening - 01/21/18 1357      Initial Review   Current issues with  Current Anxiety/Panic;Current Sleep Concerns      Family Dynamics   Good Support System?  Yes  Comments  lives with wife, two grand children      Barriers   Psychosocial barriers to participate in program  The patient should benefit from training in stress management and relaxation.      Screening Interventions   Interventions  Encouraged to exercise    Expected Outcomes  Long Term goal: The participant improves quality of Life and PHQ9 Scores as seen by post scores and/or verbalization of changes;Short Term goal: Identification and review with participant of any Quality of Life or Depression concerns found by scoring the questionnaire.;Short Term goal: Utilizing psychosocial counselor, staff and physician to assist with identification of specific Stressors or current issues interfering with healing process. Setting desired goal for each stressor or current issue identified.       Quality of Life Scores:  Scores of 19 and below usually indicate a poorer quality of life in these areas.  A difference of  2-3 points is a clinically meaningful difference.  A difference of 2-3 points in the total score of the Quality of Life Index has been associated with significant improvement in overall quality of life, self-image, physical symptoms, and general health in studies assessing change in quality of life.  PHQ-9: Recent Review Flowsheet Data    Depression screen [facility]: Lambert Rhonda W 2/9 01/21/2018 12/04/2017  09/19/2017   Decreased Interest 0 0 0   Down, Depressed, Hopeless 1 0 1   PHQ - 2 Score 1 0 1   Altered sleeping '3 3 3   '$ Tired, decreased energy '3 3 3   '$ Change in appetite 1 0 0   Feeling bad or failure about yourself  '1 1 1   '$ Trouble concentrating '1 1 3   '$ Moving slowly or fidgety/restless '1 1 1   '$ Suicidal thoughts 0 0 1   PHQ-9 Score '11 9 13   '$ Difficult doing work/chores Somewhat difficult Somewhat difficult Very difficult     Interpretation of Total Score  Total Score Depression Severity:  1-4 = Minimal depression, 5-9 = Mild depression, 10-14 = Moderate depression, 15-19 = Moderately severe depression, 20-27 = Severe depression   Psychosocial Evaluation and Intervention: Psychosocial Evaluation - 04/22/18 1617      Psychosocial Evaluation & Interventions   Interventions  Stress management education;Relaxation education;Encouraged to exercise with the program and follow exercise prescription    Comments  Pt receives therapy for PTSD, medication for anxiety. Continue to support pt in any way    Expected Outcomes  Pt will demonstrate positive healthy coping skills     Continue Psychosocial Services   Follow up required by staff       Psychosocial Re-Evaluation: Psychosocial Re-Evaluation    [place] Name 01/30/18 1658 02/28/18 1003 03/26/18 0730 04/22/18 1617       Psychosocial Re-Evaluation   Current issues with  Current Sleep Concerns;Current Anxiety/Panic  Current Sleep Concerns;Current Anxiety/Panic  Current Sleep Concerns;Current Anxiety/Panic  Current Sleep Concerns;Current Anxiety/Panic    Comments  Pt has financial anxiety due to caring for two 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  With back to school supplies and clothing needed this is difficult for pt to manage  Pt has financial anxiety due to caring for two school aged children, but has support from wife   Pt has financial anxiety due to caring for two school aged children, but has support from wife. Pt is an active participant in rehab  and if often seen socializing with staff and other participants.  Pt has financial anxiety due to caring for two school aged children, but  has support from wife. Pt has also been in charge of care of his ailing mother and has had to make several life changes to accomadate her care needs. Pt is an active participant in rehab and if often seen socializing with staff and other participants.    Expected Outcomes  Pt will develop strategies to deal with anxiety and usage of community resources to ease the financial burden.  Pt report improved sleep patterns.  Pt will develop strategies to deal with anxiety and usage of community resources to ease the financial burden.  Pt report improved sleep patterns.  Pt will develop strategies to deal with anxiety and usage of community resources to ease the financial burden.  Pt report improved sleep patterns.  Pt will develop strategies to deal with anxiety and usage of community resources to ease the financial burden.  Pt report improved sleep patterns. Pt stressors will diminish and/or pt will be able to cope with them in a positive manner.    Interventions  Stress management education;Relaxation education;Encouraged to attend Pulmonary Rehabilitation for the exercise  Stress management education;Relaxation education;Encouraged to attend Pulmonary Rehabilitation for the exercise  Stress management education;Relaxation education;Encouraged to attend Pulmonary Rehabilitation for the exercise  Stress management education;Relaxation education;Encouraged to attend Pulmonary Rehabilitation for the exercise    Continue Psychosocial Services   Follow up required by staff  Follow up required by staff  Follow up required by staff  Follow up required by staff       Psychosocial Discharge (Final Psychosocial Re-Evaluation): Psychosocial Re-Evaluation - 04/22/18 1617      Psychosocial Re-Evaluation   Current issues with  Current Sleep Concerns;Current Anxiety/Panic    Comments  Pt  has financial anxiety due to caring for two school aged children, but has support from wife. Pt has also been in charge of care of his ailing mother and has had to make several life changes to accomadate her care needs. Pt is an active participant in rehab and if often seen socializing with staff and other participants.    Expected Outcomes  Pt will develop strategies to deal with anxiety and usage of community resources to ease the financial burden.  Pt report improved sleep patterns. Pt stressors will diminish and/or pt will be able to cope with them in a positive manner.    Interventions  Stress management education;Relaxation education;Encouraged to attend Pulmonary Rehabilitation for the exercise    Continue Psychosocial Services   Follow up required by staff       Education: Education Goals: Education classes will be provided on a weekly basis, covering required topics. Participant will state understanding/return demonstration of topics presented.  Learning Barriers/Preferences: Learning Barriers/Preferences - 01/21/18 1358      Learning Barriers/Preferences   Learning Barriers  Hearing    Learning Preferences  Group Instruction;Individual Instruction;Written Material       Education Topics: Risk Factor Reduction:  -Group instruction that is supported by a PowerPoint presentation. Instructor discusses the definition of a risk factor, different risk factors for pulmonary disease, and how the heart and lungs work together.     Nutrition for Pulmonary Patient:  -Group instruction provided by PowerPoint slides, verbal discussion, and written materials to support subject matter. The instructor gives an explanation and review of healthy diet recommendations, which includes a discussion on weight management, recommendations for fruit and vegetable consumption, as well as protein, fluid, caffeine, fiber, sodium, sugar, and alcohol. Tips for eating when patients are short of breath are  discussed.  PULMONARY REHAB CHRONIC OBSTRUCTIVE PULMONARY DISEASE from 04/18/2018 in [place]  Date  02/28/18  Educator  Rodman Pickle  Instruction Review Code  1- Verbalizes Understanding      Pursed Lip Breathing:  -Group instruction that is supported by demonstration and informational handouts. Instructor discusses the benefits of pursed lip and diaphragmatic breathing and detailed demonstration on how to preform both.     Oxygen Safety:  -Group instruction provided by PowerPoint, verbal discussion, and written material to support subject matter. There is an overview of "What is Oxygen" and "Why do we need it".  Instructor also reviews how to create a safe environment for oxygen use, the importance of using oxygen as prescribed, and the risks of noncompliance. There is a brief discussion on traveling with oxygen and resources the patient may utilize.   PULMONARY REHAB CHRONIC OBSTRUCTIVE PULMONARY DISEASE from 04/18/2018 in [place]  Date  02/07/18  Educator  Remo Lipps  Instruction Review Code  1- Verbalizes Understanding      Oxygen Equipment:  -Group instruction provided by [employer] utilizing handouts, written materials, and Insurance underwriter.   PULMONARY REHAB CHRONIC OBSTRUCTIVE PULMONARY DISEASE from 04/18/2018 in [place]  Date  11/29/17  Educator  Ace Gins  Instruction Review Code  1- Verbalizes Understanding      Signs and Symptoms:  -Group instruction provided by written material and verbal discussion to support subject matter. Warning signs and symptoms of infection, stroke, and heart attack are reviewed and when to call the physician/911 reinforced. Tips for preventing the spread of infection discussed.   PULMONARY REHAB CHRONIC OBSTRUCTIVE PULMONARY DISEASE from 04/18/2018 in [place]  Date  04/18/18  Educator  Remo Lipps  Instruction  Review Code  1- Verbalizes Understanding      Advanced Directives:  -Group instruction provided by verbal instruction and written material to support subject matter. Instructor reviews Advanced Directive laws and proper instruction for filling out document.   Pulmonary Video:  -Group video education that reviews the importance of medication and oxygen compliance, exercise, good nutrition, pulmonary hygiene, and pursed lip and diaphragmatic breathing for the pulmonary patient.   Exercise for the Pulmonary Patient:  -Group instruction that is supported by a PowerPoint presentation. Instructor discusses benefits of exercise, core components of exercise, frequency, duration, and intensity of an exercise routine, importance of utilizing pulse oximetry during exercise, safety while exercising, and options of places to exercise outside of rehab.     PULMONARY REHAB CHRONIC OBSTRUCTIVE PULMONARY DISEASE from 04/18/2018 in [place]  Date  04/11/18  Educator  Kirk Ruths  Instruction Review Code  1- Verbalizes Understanding      Pulmonary Medications:  -Verbally interactive group education provided by instructor with focus on inhaled medications and proper administration.   PULMONARY REHAB CHRONIC OBSTRUCTIVE PULMONARY DISEASE from 04/18/2018 in [place]  Date  03/12/18  Educator  Pharmacist  Instruction Review Code  1- Verbalizes Understanding      Anatomy and Physiology of the Respiratory System and Intimacy:  -Group instruction provided by PowerPoint, verbal discussion, and written material to support subject matter. Instructor reviews respiratory cycle and anatomical components of the respiratory system and their functions. Instructor also reviews differences in obstructive and restrictive respiratory diseases with examples of each. Intimacy, Sex, and Sexuality differences are reviewed with a discussion on how relationships can  change when diagnosed with pulmonary  disease. Common sexual concerns are reviewed.   PULMONARY REHAB CHRONIC OBSTRUCTIVE PULMONARY DISEASE from 04/18/2018 in [place]  Date  03/21/18  Educator  rn  Instruction Review Code  2- Demonstrated Understanding      MD DAY -A group question and answer session with a medical doctor that allows participants to ask questions that relate to their pulmonary disease state.   OTHER EDUCATION -Group or individual verbal, written, or video instructions that support the educational goals of the pulmonary rehab program.   PULMONARY REHAB CHRONIC OBSTRUCTIVE PULMONARY DISEASE from 04/18/2018 in [place]  Date  04/04/18 [[facility]]  Educator  Iona Beard  Instruction Review Code  1- Verbalizes Understanding      Holiday Eating Survival Tips:  -Group instruction provided by [employer], verbal discussion, and written materials to support subject matter. The instructor gives patients tips, tricks, and techniques to help them not only survive but enjoy the holidays despite the onslaught of food that accompanies the holidays.   Knowledge Questionnaire Score: Knowledge Questionnaire Score - 01/22/18 0857      Knowledge Questionnaire Score   Pre Score  15/18       Core Components/Risk Factors/Patient Goals at Admission: Personal Goals and Risk Factors at Admission - 01/21/18 1358      Core Components/Risk Factors/Patient Goals on Admission    Weight Management  Yes;Obesity    Intervention  Weight Management: Develop a combined nutrition and exercise program designed to reach desired caloric intake, while maintaining appropriate intake of nutrient and fiber, sodium and fats, and appropriate energy expenditure required for the weight goal.;Weight Management: Provide education and appropriate resources to help participant work on and attain dietary goals.;Weight Management/Obesity: Establish  reasonable short term and long term weight goals.;Obesity: Provide education and appropriate resources to help participant work on and attain dietary goals.    Admit Weight  238 lb 12.1 oz (108.3 kg)    Goal Weight: Short Term  218 lb (98.9 kg)    Goal Weight: Long Term  200 lb (90.7 kg)    Expected Outcomes  Short Term: Continue to assess and modify interventions until short term weight is achieved;Long Term: Adherence to nutrition and physical activity/exercise program aimed toward attainment of established weight goal;Weight Loss: Understanding of general recommendations for a balanced deficit meal plan, which promotes 1-2 lb weight loss per week and includes a negative energy balance of [phone] kcal/d;Understanding recommendations for meals to include 15-35% energy as protein, 25-35% energy from fat, 35-60% energy from carbohydrates, less than '200mg'$  of dietary cholesterol, 20-35 gm of total fiber daily;Understanding of distribution of calorie intake throughout the day with the consumption of 4-5 meals/snacks    Improve shortness of breath with ADL's  Yes    Intervention  Provide education, individualized exercise plan and daily activity instruction to help decrease symptoms of SOB with activities of daily living.    Expected Outcomes  Short Term: Improve cardiorespiratory fitness to achieve a reduction of symptoms when performing ADLs;Long Term: Be able to perform more ADLs without symptoms or delay the onset of symptoms       Core Components/Risk Factors/Patient Goals Review:  Goals and Risk Factor Review    Row Name 01/30/18 1658 02/28/18 1004 03/25/18 0911 04/22/18 1619       Core Components/Risk Factors/Patient Goals Review   Personal Goals Review  Weight Management/Obesity;Improve shortness of breath with ADL's;Increase knowledge of respiratory medications and ability to use respiratory devices properly.;Develop  more efficient breathing techniques such as purse lipped breathing and  diaphragmatic breathing and practicing self-pacing with activity.  Weight Management/Obesity;Improve shortness of breath with ADL's;Increase knowledge of respiratory medications and ability to use respiratory devices properly.;Develop more efficient breathing techniques such as purse lipped breathing and diaphragmatic breathing and practicing self-pacing with activity.  Weight Management/Obesity;Improve shortness of breath with ADL's;Increase knowledge of respiratory medications and ability to use respiratory devices properly.;Develop more efficient breathing techniques such as purse lipped breathing and diaphragmatic breathing and practicing self-pacing with activity.  Weight Management/Obesity;Improve shortness of breath with ADL's;Increase knowledge of respiratory medications and ability to use respiratory devices properly.;Develop more efficient breathing techniques such as purse lipped breathing and diaphragmatic breathing and practicing self-pacing with activity.    Review  Pt to begin pulmonary rehab on 12/12.    Pt has completed 5 excercise sessions and 2 education sessions. Pt has began to see good weight loss with a decrease of 3.4 kg since beginning exercise on 12/17. He is able to demonstrate good PLB technique with staff promting. Pt will strive to be more independent with breathing technique. His workloads have already been increases to 2.0/2.0 on the treadmill, level 6 on the nustep and 0.8 on the airdyne. Will continue to monitor for improvements in the next 30 day review.  Pt has completed 10 excercise sessions and 5 education sessions. Pt has began to see good weight loss with a decrease of 3.0 kg since beginning exercise on 12/17, though he has had some mild fluctuation. He is able to demonstrate good PLB technique with staff promting. Pt will strive to be more independent with breathing technique. His workloads have remained 2.0/2.0 on the treadmill, level 5 on the nustep and has increased to  1.0 on the airdyne. Will continue to monitor for improvements in the next 30 day review.  Pt has completed 16 excercise sessions and 8 education sessions. Pt had begun to see good weight loss with a decrease of 3.0 kg since beginning exercise on 12/17, but weight has increased by 1.6 kg over the last couple weeks from his starting weight. Pt states this is related to the multiple stressors. He is able to demonstrate good PLB technique with staff promting. Pt will strive to be more independent with breathing technique. His workloads have increased to 2.2/3.0 on the treadmill, level 5 on the nustep and has transitioned to High intensity interval training on the airdyne. Will continue to monitor for improvements in the next 30 day review.    Expected Outcomes  See Admission Goals/Outcomes  see admission goals and outcomes  see admission goals and outcomes  see admission goals and outcomes       Core Components/Risk Factors/Patient Goals at Discharge (Final Review):  Goals and Risk Factor Review - 04/22/18 1619      Core Components/Risk Factors/Patient Goals Review   Personal Goals Review  Weight Management/Obesity;Improve shortness of breath with ADL's;Increase knowledge of respiratory medications and ability to use respiratory devices properly.;Develop more efficient breathing techniques such as purse lipped breathing and diaphragmatic breathing and practicing self-pacing with activity.    Review  Pt has completed 16 excercise sessions and 8 education sessions. Pt had begun to see good weight loss with a decrease of 3.0 kg since beginning exercise on 12/17, but weight has increased by 1.6 kg over the last couple weeks from his starting weight. Pt states this is related to the multiple stressors. He is able to demonstrate good PLB technique with staff promting.  Pt will strive to be more independent with breathing technique. His workloads have increased to 2.2/3.0 on the treadmill, level 5 on the nustep and  has transitioned to High intensity interval training on the airdyne. Will continue to monitor for improvements in the next 30 day review.    Expected Outcomes  see admission goals and outcomes       ITP Comments: ITP Comments    Row Name 01/21/18 1346 01/30/18 1658 02/28/18 1001 03/25/18 0908 04/22/18 1617   ITP Comments  Dr. Manfred Arch, Medical Director Pulmonary Rehab  Dr. Manfred Arch, Medical Director Pulmonary Rehab  Dr. Manfred Arch, Medical Director Pulmonary Rehab  Dr. Manfred Arch, Medical Director Pulmonary Rehab  Dr. Manfred Arch, Medical Director Pulmonary Rehab      Comments: ITP REVIEW Pt is making expected progress toward personal goals after completing 16 sessions. Recommend continued exercise, life style modification, education, and utilization of breathing techniques to increase stamina and strength and decrease shortness of breath with exertion.  Joycelyn Man RN, BSN Cardiac and Pulmonary Rehab RN

## 2018-04-23 ENCOUNTER — Telehealth (HOSPITAL_COMMUNITY): Payer: Self-pay

## 2018-04-23 ENCOUNTER — Encounter (HOSPITAL_COMMUNITY)
Admission: RE | Admit: 2018-04-23 | Discharge: 2018-04-23 | Disposition: A | Discharge status: Home / Self Care | Payer: Non-veteran care | Source: Ambulatory Visit | Attending: Pulmonary Disease | Admitting: Pulmonary Disease

## 2018-04-23 DIAGNOSIS — J449 Chronic obstructive pulmonary disease, unspecified: Secondary | ICD-10-CM

## 2018-04-23 DIAGNOSIS — J438 Other emphysema: Secondary | ICD-10-CM | POA: Insufficient documentation

## 2018-04-25 ENCOUNTER — Encounter (HOSPITAL_COMMUNITY)
Admission: RE | Admit: 2018-04-25 | Discharge: 2018-04-25 | Disposition: A | Discharge status: Home / Self Care | Payer: Non-veteran care | Source: Ambulatory Visit | Attending: Pulmonary Disease | Admitting: Pulmonary Disease

## 2018-04-25 DIAGNOSIS — J438 Other emphysema: Secondary | ICD-10-CM | POA: Diagnosis present

## 2018-04-25 DIAGNOSIS — J449 Chronic obstructive pulmonary disease, unspecified: Secondary | ICD-10-CM

## 2018-04-25 NOTE — Progress Notes (Signed)
Daily Session Note  Patient Details  Name: Vincent Sutton MRN: [id number] Date of Birth: 01-03-1961 Referring Provider:     Pulmonary Rehab Walk Test from 01/22/2018 in [place]  Referring Provider  Dr. Nelda Marseille      Encounter Date: 04/25/2018  Check In: Session Check In - 04/25/18 1024      Check-In   Supervising physician immediately available to respond to emergencies  [facility] immediately available    Physician(s)  Dr. Wynelle Cleveland    Location  [facility]    Staff Present  Joycelyn Man RN, BSN;Molly DiVincenzo, MS, ACSM RCEP, Exercise Physiologist;Dalton Kris Mouton, MS, Exercise Physiologist;Joan Leonia Reeves, RN, Roque Cash, RN    Medication changes reported      No    Fall or balance concerns reported     No    Tobacco Cessation  No Change    Warm-up and Cool-down  Performed as group-led instruction    Resistance Training Performed  Yes    VAD Patient?  No    PAD/SET Patient?  No      Pain Assessment   Currently in Pain?  No/denies    Multiple Pain Sites  No       Capillary Blood Glucose: No results found for this or any previous visit (from the past 24 hour(s)).    Social History   Tobacco Use  Smoking Status Former Smoker  . Packs/day: 2.00  . Years: 30.00  . Pack years: 60.00  . Types: Cigarettes  . Last attempt to quit: 04/08/2013  . Years since quitting: 5.0  Smokeless Tobacco Never Used    Goals Met:  Exercise tolerated well No report of cardiac concerns or symptoms Strength training completed today  Goals Unmet:  Not Applicable  Comments: Service time is from 1030 to 1200    Dr. Rush Farmer is Medical Director for Pulmonary Rehab at [facility].

## 2018-04-30 ENCOUNTER — Telehealth: Payer: Self-pay | Admitting: Adult Health

## 2018-04-30 ENCOUNTER — Encounter (HOSPITAL_COMMUNITY)
Admission: RE | Admit: 2018-04-30 | Discharge: 2018-04-30 | Disposition: A | Discharge status: Home / Self Care | Payer: Non-veteran care | Source: Ambulatory Visit | Attending: Pulmonary Disease | Admitting: Pulmonary Disease

## 2018-04-30 VITALS — Wt 241.0 lb

## 2018-04-30 DIAGNOSIS — J438 Other emphysema: Secondary | ICD-10-CM | POA: Diagnosis not present

## 2018-04-30 DIAGNOSIS — J449 Chronic obstructive pulmonary disease, unspecified: Secondary | ICD-10-CM

## 2018-04-30 NOTE — Telephone Encounter (Signed)
Vincent Sutton is this something that you can help with?

## 2018-04-30 NOTE — Progress Notes (Signed)
Daily Session Note  Patient Details  Name: ZANDER INGHAM MRN: [id number] Date of Birth: 1960-09-08 Referring Provider:     Pulmonary Rehab Walk Test from 01/22/2018 in [place]  Referring Provider  Dr. Nelda Marseille      Encounter Date: 04/30/2018  Check In: Session Check In - 04/30/18 1125      Check-In   Supervising physician immediately available to respond to emergencies  [facility] immediately available    Physician(s)  Dr. Jonnie Finner    Location  [facility]    Staff Present  Su Hilt, MS, ACSM RCEP, Exercise Physiologist;Dalton Kris Mouton, MS, Exercise Physiologist;Joan Leonia Reeves, RN, Roque Cash, RN;Other    Medication changes reported      No    Fall or balance concerns reported     No    Tobacco Cessation  No Change    Warm-up and Cool-down  Performed as group-led instruction    Resistance Training Performed  Yes    VAD Patient?  No    PAD/SET Patient?  No      Pain Assessment   Currently in Pain?  No/denies    Multiple Pain Sites  No       Capillary Blood Glucose: No results found for this or any previous visit (from the past 24 hour(s)).    Social History   Tobacco Use  Smoking Status Former Smoker  . Packs/day: 2.00  . Years: 30.00  . Pack years: 60.00  . Types: Cigarettes  . Last attempt to quit: 04/08/2013  . Years since quitting: 5.0  Smokeless Tobacco Never Used    Goals Met:  Exercise tolerated well No report of cardiac concerns or symptoms Strength training completed today  Goals Unmet:  Not Applicable  Comments: Service time is from 1030 to 1215    Dr. Rush Farmer is Medical Director for Pulmonary Rehab at [facility].

## 2018-05-01 NOTE — Telephone Encounter (Signed)
I believe pulmonary rehab is includedu in our authorization from the [place]. We should be able to give them the same authorization number that we have for our services. The copy of the authorization should be loaded under the demographics tab. -pr

## 2018-05-01 NOTE — Telephone Encounter (Signed)
LMOM TCB x1 for Nevin Bloodgood The authorization is under the MEDIA tab and does mention pulmonary rehab

## 2018-05-02 ENCOUNTER — Encounter (HOSPITAL_COMMUNITY): Payer: Non-veteran care

## 2018-05-02 NOTE — Telephone Encounter (Signed)
Called and spoke with Vincent Sutton letting her know that there is an authorization in pt's chart from [place] for pulm rehab in the media tab. Vincent Sutton stated to me that she was able to see that but she also stated that that authorization expired 04/04/2018 and wants to know if anyone had sent in for an extension due to pt having rehab visits past this.  [name], please advise if you can help [place] out with this. Thanks!

## 2018-05-03 NOTE — Telephone Encounter (Signed)
I spoke with Nevin Bloodgood and notified of recs per Sharl Ma and she verbalized understanding

## 2018-05-03 NOTE — Telephone Encounter (Signed)
I will complete a request for updated authorization and once received I will update the patient's chart. Thanks -pr

## 2018-05-06 ENCOUNTER — Telehealth (HOSPITAL_COMMUNITY): Payer: Self-pay

## 2018-05-06 NOTE — Telephone Encounter (Signed)
Pt called and message left informing pt of Cardiac and Pulmonary rehab closure for the next 2 weeks.   Bryttani Blew RN, BSN Cardiac and Pulmonary Rehab RN  

## 2018-05-07 ENCOUNTER — Encounter (HOSPITAL_COMMUNITY): Payer: Non-veteran care

## 2018-05-09 ENCOUNTER — Encounter (HOSPITAL_COMMUNITY): Payer: Non-veteran care

## 2018-05-13 ENCOUNTER — Encounter (HOSPITAL_COMMUNITY): Payer: Self-pay

## 2018-05-13 NOTE — Progress Notes (Signed)
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 scale;Able to understand and use Dyspnea scale;Knowledge and understanding of Target Heart Rate Range (THRR);Understanding of Exercise Prescription  Increase Physical Activity;Increase Strength and Stamina;Able to understand and use rate of perceived exertion (RPE) scale;Able to understand and use Dyspnea scale;Knowledge and understanding of Target Heart Rate Range (THRR);Understanding of Exercise Prescription  Increase Physical Activity;Increase Strength and Stamina;Able to understand and use rate of perceived exertion (RPE) scale;Able to understand and use Dyspnea scale;Knowledge and understanding of Target Heart Rate Range (THRR);Understanding of  Exercise Prescription  Increase Physical Activity;Increase Strength and Stamina;Able to understand and use rate of perceived exertion (RPE) scale;Able to understand and use Dyspnea scale;Knowledge and understanding of Target Heart Rate Range (THRR);Understanding of Exercise Prescription  Increase Physical Activity;Increase Strength and Stamina;Able to understand and use rate of perceived exertion (RPE) scale;Able to understand and use Dyspnea scale;Knowledge and understanding of Target Heart Rate Range (THRR);Understanding of Exercise Prescription   Comments  Patient is progressing well in rehab. His big goal is weight loss. Is active at home. Needs to concentrate on diet. Is open to workload changes.. Sometimes does desat-already on 4 liters (COPD). Will graduate from rehab today.   Pt just completed the program in October and was referred back. Pt has the ability to exercise at home, but lacks motivation. Pt gained quite a bit of weight and came back in deconditioned. Pt is currently exercising at 2.87 METs  on the treadmill. I will be more aggressive with his progression as this is his second time through the program in such a short time. Will continue to monitor and progress as able.   Pt is progressing slower than I would like considering that he just completed the program a few months ago. I have sent a fax to Dr. Gwenette Greet from the [facility] seeking permission to engage pt in HIIT exercise while on the airdyne bike in an attempt to make the pt become more engaged. He is currently exercising at 3.08 METs while on the treadmill. Will continue to monitor and progress as able.   Pt is beginning to progress at a level that I would like. Pt has begun doing a HIIT protocol on the Airdyne bike. Pt exercises at 3.6 METs while on the treadmill. Pt gained ~10 lbs in the month of Feb. which is concerning. I notified Rodman Pickle, our dietician, who consulted with him. Pt is able to exercise at home, but I have my doubts  that he actually does. Pt has the abilities and all the knowledge that he needs, but I feel that he lacks the motivation to makes changes without a group exercise setting. Will continue to monitor and progress as able.   Pt's [facility] authorization date runs out at the end of the month, so it is unlikely that pt will return back to the program. Pt did not progress as well as I would have hoped through the program considering he had recently just completed the program. Pt lacked motivation and put on ~10 pounds in a 1 month span due to poor dietary decisions. Will follow up with pt this week when I recieve updates and discuss his future with him.   Expected Outcomes  Through exercise at home and at rehab, patient will increase physical capacity and ADL's will become easier to perform.   Through exercise at home and at rehab, patient will increase physical capacity and ADL's will become easier to perform.   Through exercise at rehab and at home, the patient will decrease shortness of breath with daily activities and feel confident in carrying out an exercise regime at home.   Through exercise at rehab and at home, the patient will decrease shortness of breath with daily activities and feel confident in carrying out an exercise regime at home.   Through exercise at rehab and at home, the patient will decrease shortness of breath with daily activities and feel confident in carrying out an exercise regime at home.       Discharge Exercise Prescription (Final Exercise Prescription Changes): Exercise Prescription Changes - 04/30/18 1144      Response to Exercise   Blood Pressure (Admit)  120/72    Blood Pressure (Exercise)  126/74    Blood Pressure (Exit)  124/82    Heart Rate (Admit)  78 bpm    Heart Rate (Exercise)  118 bpm    Heart Rate (Exit)  108 bpm    Oxygen Saturation (Admit)  97 %    Oxygen Saturation (Exercise)  91 %    Oxygen Saturation (Exit)  96 %    Rating of Perceived Exertion (Exercise)  15     Perceived Dyspnea (Exercise)  2    Duration  Progress to 45 minutes of aerobic exercise without signs/symptoms of physical distress    Intensity  THRR unchanged      Progression   Progression  Continue to progress workloads to maintain intensity without signs/symptoms of  physical distress.      Resistance Training   Training Prescription  Yes    Weight  blue bands    Reps  10-15    Time  10 Minutes      Oxygen   Oxygen  Continuous    Liters  4      Treadmill   MPH  2.4    Grade  3    Minutes  17      Bike   Level  0.6    Minutes  17      NuStep   Level  6    SPM  80    Minutes  17    METs  2.6       Nutrition:  Target Goals: Understanding of nutrition guidelines, daily intake of sodium <1511m, cholesterol <206m calories 30% from fat and 7% or less from saturated fats, daily to have 5 or more servings of fruits and vegetables.  Biometrics:    Nutrition Therapy Plan and Nutrition Goals: Nutrition Therapy & Goals - 02/21/18 1212      Nutrition Therapy   Diet  general healthful      Personal Nutrition Goals   Nutrition Goal  Identify food quantities necessary to achieve wt loss of  -2# per week to a goal wt loss of 2.7-10.9 kg (6-24 lb) at graduation from pulmonary rehab.    Personal Goal #2  Pt to identify and limit food sources of sodium.    Personal Goal #3  The pt will have family and friends shop for food when necessary so that nourishing foods are always available at home.      Intervention Plan   Intervention  Prescribe, educate and counsel regarding individualized specific dietary modifications aiming towards targeted core components such as weight, hypertension, lipid management, diabetes, heart failure and other comorbidities.    Expected Outcomes  Short Term Goal: Understand basic principles of dietary content, such as calories, fat, sodium, cholesterol and nutrients.;Long Term Goal: Adherence to prescribed nutrition plan.       Nutrition  Assessments: Nutrition Assessments - 02/18/18 1514      Rate Your Plate Scores   Pre Score  37       Nutrition Goals Re-Evaluation: Nutrition Goals Re-Evaluation    [place] 01/18/18 1723 02/21/18 1212           Goals   Current Weight  239 lb 6.7 oz (108.6 kg)  240 lb 11.9 oz (109.2 kg)      Nutrition Goal  Identify food quantities necessary to achieve wt loss of  -2# per week to a goal wt loss of 2.7-10.9 kg (6-24 lb) at graduation from pulmonary rehab.  -      Comment  weight loss goal not met, continue to encourage weight loss  -         Nutrition Goals Discharge (Final Nutrition Goals Re-Evaluation): Nutrition Goals Re-Evaluation - 02/21/18 1212      Goals   Current Weight  240 lb 11.9 oz (109.2 kg)       Psychosocial: Target Goals: Acknowledge presence or absence of significant depression and/or stress, maximize coping skills, provide positive support system. Participant is able to verbalize types and ability to use techniques and skills needed for reducing stress and depression.  Initial Review & Psychosocial Screening: Initial Psych Review & Screening - 01/21/18 1357      Initial Review   Current issues with  Current Anxiety/Panic;Current Sleep Concerns  Family Dynamics   Good Support System?  Yes    Comments  lives with wife, two grand children      Barriers   Psychosocial barriers to participate in program  The patient should benefit from training in stress management and relaxation.      Screening Interventions   Interventions  Encouraged to exercise    Expected Outcomes  Long Term goal: The participant improves quality of Life and PHQ9 Scores as seen by post scores and/or verbalization of changes;Short Term goal: Identification and review with participant of any Quality of Life or Depression concerns found by scoring the questionnaire.;Short Term goal: Utilizing psychosocial counselor, staff and physician to assist with identification of specific  Stressors or current issues interfering with healing process. Setting desired goal for each stressor or current issue identified.       Quality of Life Scores:  Scores of 19 and below usually indicate a poorer quality of life in these areas.  A difference of  2-3 points is a clinically meaningful difference.  A difference of 2-3 points in the total score of the Quality of Life Index has been associated with significant improvement in overall quality of life, self-image, physical symptoms, and general health in studies assessing change in quality of life.  PHQ-9: Recent Review Flowsheet Data    Depression screen [facility] 2/9 01/21/2018 12/04/2017 09/19/2017   Decreased Interest 0 0 0   Down, Depressed, Hopeless 1 0 1   PHQ - 2 Score 1 0 1   Altered sleeping _0 Tired, decreased energy _1 Change in appetite 1 0 0   Feeling bad or failure about yourself  _2 Trouble concentrating _3 Moving slowly or fidgety/restless _4 Suicidal thoughts 0 0 1   PHQ-9 Score _5 Difficult doing work/chores Somewhat difficult Somewhat difficult Very difficult     Interpretation of Total Score  Total Score Depression Severity:  1-4 = Minimal depression, 5-9 = Mild depression, 10-14 = Moderate depression, 15-19 = Moderately severe depression, 20-27 = Severe depression   Psychosocial Evaluation and Intervention: Psychosocial Evaluation - 04/22/18 1617      Psychosocial Evaluation & Interventions   Interventions  Stress management education;Relaxation education;Encouraged to exercise with the program and follow exercise prescription    Comments  Pt receives therapy for PTSD, medication for anxiety. Continue to support pt in any way    Expected Outcomes  Pt will demonstrate positive healthy coping skills     Continue Psychosocial Services   Follow up required by staff       Psychosocial Re-Evaluation: Psychosocial Re-Evaluation    [place] Name 01/30/18 1658 02/28/18 1003 03/26/18 0730  04/22/18 1617 05/13/18 1545     Psychosocial Re-Evaluation   Current issues with  Current Sleep Concerns;Current Anxiety/Panic  Current Sleep Concerns;Current Anxiety/Panic  Current Sleep Concerns;Current Anxiety/Panic  Current Sleep Concerns;Current Anxiety/Panic  Current Sleep Concerns;Current Anxiety/Panic   Comments  Pt has financial anxiety due to caring for two 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  With back to school supplies and clothing needed this is difficult for pt to manage  Pt has financial anxiety due to caring for two school aged children, but has support from wife   Pt has financial anxiety due to caring for two school aged children, but has support from wife. Pt is an active participant in rehab and if often seen socializing with staff and  other participants.  Pt has financial anxiety due to caring for two school aged children, but has support from wife. Pt has also been in charge of care of his ailing mother and has had to make several life changes to accomadate her care needs. Pt is an active participant in rehab and if often seen socializing with staff and other participants.  Pt continues to have financial anxiety due to caring for two 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 Pt has also been in charge of care of his ailing mother and has been in transition of bringing her home over the last several weeks. Pt states there has been some adjustment, but feels more comfortable with his mother being at home instead of in the care facility she was in. Rehab is currently closed and VA authorization will expire prior to departmental reopening.    Expected Outcomes  Pt will develop strategies to deal with anxiety and usage of community resources to ease the financial burden.  Pt report improved sleep patterns.  Pt will develop strategies to deal with anxiety and usage of community resources to ease the financial burden.  Pt report improved sleep patterns.  Pt will develop strategies to deal with anxiety and usage of community  resources to ease the financial burden.  Pt report improved sleep patterns.  Pt will develop strategies to deal with anxiety and usage of community resources to ease the financial burden.  Pt report improved sleep patterns. Pt stressors will diminish and/or pt will be able to cope with them in a positive manner.  Pt will develop strategies to deal with anxiety and usage of community resources to ease the financial burden.  Pt report improved sleep patterns. Pt stressors will diminish and/or pt will be able to cope with them in a positive manner.   Interventions  Stress management education;Relaxation education;Encouraged to attend Pulmonary Rehabilitation for the exercise  Stress management education;Relaxation education;Encouraged to attend Pulmonary Rehabilitation for the exercise  Stress management education;Relaxation education;Encouraged to attend Pulmonary Rehabilitation for the exercise  Stress management education;Relaxation education;Encouraged to attend Pulmonary Rehabilitation for the exercise  Stress management education;Relaxation education;Encouraged to attend Pulmonary Rehabilitation for the exercise   Continue Psychosocial Services   Follow up required by staff  Follow up required by staff  Follow up required by staff  Follow up required by staff  Follow up required by staff      Psychosocial Discharge (Final Psychosocial Re-Evaluation): Psychosocial Re-Evaluation - 05/13/18 1545      Psychosocial Re-Evaluation   Current issues with  Current Sleep Concerns;Current Anxiety/Panic    Comments  Pt continues to have financial anxiety due to caring for two 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 Pt has also been in charge of care of his ailing mother and has been in transition of bringing her home over the last several weeks. Pt states there has been some adjustment, but feels more comfortable with his mother being at home instead of in the care facility she was in. Rehab is currently closed and VA authorization  will expire prior to departmental reopening.     Expected Outcomes  Pt will develop strategies to deal with anxiety and usage of community resources to ease the financial burden.  Pt report improved sleep patterns. Pt stressors will diminish and/or pt will be able to cope with them in a positive manner.    Interventions  Stress management education;Relaxation education;Encouraged to attend Pulmonary Rehabilitation for the exercise    Continue Psychosocial Services   Follow up required by staff  Education: Education Goals: Education classes will be provided on a weekly basis, covering required topics. Participant will state understanding/return demonstration of topics presented.  Learning Barriers/Preferences: Learning Barriers/Preferences - 01/21/18 1358      Learning Barriers/Preferences   Learning Barriers  Hearing    Learning Preferences  Group Instruction;Individual Instruction;Written Material       Education Topics: Risk Factor Reduction:  -Group instruction that is supported by a PowerPoint presentation. Instructor discusses the definition of a risk factor, different risk factors for pulmonary disease, and how the heart and lungs work together.     Nutrition for Pulmonary Patient:  -Group instruction provided by PowerPoint slides, verbal discussion, and written materials to support subject matter. The instructor gives an explanation and review of healthy diet recommendations, which includes a discussion on weight management, recommendations for fruit and vegetable consumption, as well as protein, fluid, caffeine, fiber, sodium, sugar, and alcohol. Tips for eating when patients are short of breath are discussed.   PULMONARY REHAB OTHER RESPIRATORY from 04/25/2018 in [place]  Date  02/28/18  Educator  Rodman Pickle  Instruction Review Code  1- Verbalizes Understanding      Pursed Lip Breathing:  -Group instruction that is supported by  demonstration and informational handouts. Instructor discusses the benefits of pursed lip and diaphragmatic breathing and detailed demonstration on how to preform both.     Oxygen Safety:  -Group instruction provided by PowerPoint, verbal discussion, and written material to support subject matter. There is an overview of "What is Oxygen" and "Why do we need it".  Instructor also reviews how to create a safe environment for oxygen use, the importance of using oxygen as prescribed, and the risks of noncompliance. There is a brief discussion on traveling with oxygen and resources the patient may utilize.   PULMONARY REHAB OTHER RESPIRATORY from 04/25/2018 in [place]  Date  02/07/18  Educator  Remo Lipps  Instruction Review Code  1- Verbalizes Understanding      Oxygen Equipment:  -Group instruction provided by [employer] utilizing handouts, written materials, and Insurance underwriter.   PULMONARY REHAB OTHER RESPIRATORY from 04/25/2018 in [place]  Date  11/29/17  Educator  Ace Gins  Instruction Review Code  1- Verbalizes Understanding      Signs and Symptoms:  -Group instruction provided by written material and verbal discussion to support subject matter. Warning signs and symptoms of infection, stroke, and heart attack are reviewed and when to call the physician/911 reinforced. Tips for preventing the spread of infection discussed.   PULMONARY REHAB OTHER RESPIRATORY from 04/25/2018 in [place]  Date  04/18/18  Educator  Remo Lipps  Instruction Review Code  1- Verbalizes Understanding      Advanced Directives:  -Group instruction provided by verbal instruction and written material to support subject matter. Instructor reviews Advanced Directive laws and proper instruction for filling out document.   Pulmonary Video:  -Group video education that reviews the importance of medication and  oxygen compliance, exercise, good nutrition, pulmonary hygiene, and pursed lip and diaphragmatic breathing for the pulmonary patient.   Exercise for the Pulmonary Patient:  -Group instruction that is supported by a PowerPoint presentation. Instructor discusses benefits of exercise, core components of exercise, frequency, duration, and intensity of an exercise routine, importance of utilizing pulse oximetry during exercise, safety while exercising, and options of places to exercise outside of rehab.     PULMONARY REHAB  OTHER RESPIRATORY from 04/25/2018 in [place]  Date  04/11/18  Educator  Kirk Ruths  Instruction Review Code  1- Verbalizes Understanding      Pulmonary Medications:  -Verbally interactive group education provided by instructor with focus on inhaled medications and proper administration.   PULMONARY REHAB OTHER RESPIRATORY from 04/25/2018 in [place]  Date  03/12/18  Educator  Pharmacist  Instruction Review Code  1- Verbalizes Understanding      Anatomy and Physiology of the Respiratory System and Intimacy:  -Group instruction provided by PowerPoint, verbal discussion, and written material to support subject matter. Instructor reviews respiratory cycle and anatomical components of the respiratory system and their functions. Instructor also reviews differences in obstructive and restrictive respiratory diseases with examples of each. Intimacy, Sex, and Sexuality differences are reviewed with a discussion on how relationships can change when diagnosed with pulmonary disease. Common sexual concerns are reviewed.   PULMONARY REHAB OTHER RESPIRATORY from 04/25/2018 in [place]  Date  03/21/18  Educator  rn  Instruction Review Code  2- Demonstrated Understanding      MD DAY -A group question and answer session with a medical doctor that allows participants to ask questions that relate to  their pulmonary disease state.   OTHER EDUCATION -Group or individual verbal, written, or video instructions that support the educational goals of the pulmonary rehab program.   PULMONARY REHAB OTHER RESPIRATORY from 04/25/2018 in [place]  Date  04/04/18 [[facility]]  Educator  Iona Beard  Instruction Review Code  1- Verbalizes Understanding      Holiday Eating Survival Tips:  -Group instruction provided by [employer], verbal discussion, and written materials to support subject matter. The instructor gives patients tips, tricks, and techniques to help them not only survive but enjoy the holidays despite the onslaught of food that accompanies the holidays.   Knowledge Questionnaire Score: Knowledge Questionnaire Score - 01/22/18 0857      Knowledge Questionnaire Score   Pre Score  15/18       Core Components/Risk Factors/Patient Goals at Admission: Personal Goals and Risk Factors at Admission - 01/21/18 1358      Core Components/Risk Factors/Patient Goals on Admission    Weight Management  Yes;Obesity    Intervention  Weight Management: Develop a combined nutrition and exercise program designed to reach desired caloric intake, while maintaining appropriate intake of nutrient and fiber, sodium and fats, and appropriate energy expenditure required for the weight goal.;Weight Management: Provide education and appropriate resources to help participant work on and attain dietary goals.;Weight Management/Obesity: Establish reasonable short term and long term weight goals.;Obesity: Provide education and appropriate resources to help participant work on and attain dietary goals.    Admit Weight  238 lb 12.1 oz (108.3 kg)    Goal Weight: Short Term  218 lb (98.9 kg)    Goal Weight: Long Term  200 lb (90.7 kg)    Expected Outcomes  Short Term: Continue to assess and modify interventions until short term weight is achieved;Long Term: Adherence to nutrition and  physical activity/exercise program aimed toward attainment of established weight goal;Weight Loss: Understanding of general recommendations for a balanced deficit meal plan, which promotes 1-2 lb weight loss per week and includes a negative energy balance of [phone] kcal/d;Understanding recommendations for meals to include 15-35% energy as protein, 25-35% energy from fat, 35-60% energy from carbohydrates, less than 279m of dietary cholesterol, 20-35 gm  of total fiber daily;Understanding of distribution of calorie intake throughout the day with the consumption of 4-5 meals/snacks    Improve shortness of breath with ADL's  Yes    Intervention  Provide education, individualized exercise plan and daily activity instruction to help decrease symptoms of SOB with activities of daily living.    Expected Outcomes  Short Term: Improve cardiorespiratory fitness to achieve a reduction of symptoms when performing ADLs;Long Term: Be able to perform more ADLs without symptoms or delay the onset of symptoms       Core Components/Risk Factors/Patient Goals Review:  Goals and Risk Factor Review    [place] Name 01/30/18 1658 02/28/18 1004 03/25/18 0911 04/22/18 1619 05/13/18 1550     Core Components/Risk Factors/Patient Goals Review   Personal Goals Review  Weight Management/Obesity;Improve shortness of breath with ADL's;Increase knowledge of respiratory medications and ability to use respiratory devices properly.;Develop more efficient breathing techniques such as purse lipped breathing and diaphragmatic breathing and practicing self-pacing with activity.  Weight Management/Obesity;Improve shortness of breath with ADL's;Increase knowledge of respiratory medications and ability to use respiratory devices properly.;Develop more efficient breathing techniques such as purse lipped breathing and diaphragmatic breathing and practicing self-pacing with activity.  Weight Management/Obesity;Improve shortness of breath with  ADL's;Increase knowledge of respiratory medications and ability to use respiratory devices properly.;Develop more efficient breathing techniques such as purse lipped breathing and diaphragmatic breathing and practicing self-pacing with activity.  Weight Management/Obesity;Improve shortness of breath with ADL's;Increase knowledge of respiratory medications and ability to use respiratory devices properly.;Develop more efficient breathing techniques such as purse lipped breathing and diaphragmatic breathing and practicing self-pacing with activity.  Weight Management/Obesity;Improve shortness of breath with ADL's;Increase knowledge of respiratory medications and ability to use respiratory devices properly.;Develop more efficient breathing techniques such as purse lipped breathing and diaphragmatic breathing and practicing self-pacing with activity.   Review  Pt to begin pulmonary rehab on 12/12.    Pt has completed 5 excercise sessions and 2 education sessions. Pt has began to see good weight loss with a decrease of 3.4 kg since beginning exercise on 12/17. He is able to demonstrate good PLB technique with staff promting. Pt will strive to be more independent with breathing technique. His workloads have already been increases to 2.0/2.0 on the treadmill, level 6 on the nustep and 0.8 on the airdyne. Will continue to monitor for improvements in the next 30 day review.  Pt has completed 10 excercise sessions and 5 education sessions. Pt has began to see good weight loss with a decrease of 3.0 kg since beginning exercise on 12/17, though he has had some mild fluctuation. He is able to demonstrate good PLB technique with staff promting. Pt will strive to be more independent with breathing technique. His workloads have remained 2.0/2.0 on the treadmill, level 5 on the nustep and has increased to 1.0 on the airdyne. Will continue to monitor for improvements in the next 30 day review.  Pt has completed 16 excercise sessions  and 8 education sessions. Pt had begun to see good weight loss with a decrease of 3.0 kg since beginning exercise on 12/17, but weight has increased by 1.6 kg over the last couple weeks from his starting weight. Pt states this is related to the multiple stressors. He is able to demonstrate good PLB technique with staff promting. Pt will strive to be more independent with breathing technique. His workloads have increased to 2.2/3.0 on the treadmill, level 5 on the nustep and has transitioned to High  intensity interval training on the airdyne. Will continue to monitor for improvements in the next 30 day review.  Pt has completed 18 excercise sessions and 9 education sessions. Pt weight has fluctuated and after 18 sessions is back up to his starting weight. Pt states this is related to the multiple stressors. He is able to demonstrate good PLB technique with staff promting. His workloads have increased to 2.4/3.0 on the treadmill, level 6 on the nustep and has continued with High intensity interval training on the airdyne. With departmental closure and expiration of [facility] authorization, will discharge pt from program.   Expected Outcomes  See Admission Goals/Outcomes  see admission goals and outcomes  see admission goals and outcomes  see admission goals and outcomes  see admission goals and outcomes      Core Components/Risk Factors/Patient Goals at Discharge (Final Review):  Goals and Risk Factor Review - 05/13/18 1550      Core Components/Risk Factors/Patient Goals Review   Personal Goals Review  Weight Management/Obesity;Improve shortness of breath with ADL's;Increase knowledge of respiratory medications and ability to use respiratory devices properly.;Develop more efficient breathing techniques such as purse lipped breathing and diaphragmatic breathing and practicing self-pacing with activity.    Review  Pt has completed 18 excercise sessions and 9 education sessions. Pt weight has fluctuated and after 18  sessions is back up to his starting weight. Pt states this is related to the multiple stressors. He is able to demonstrate good PLB technique with staff promting. His workloads have increased to 2.4/3.0 on the treadmill, level 6 on the nustep and has continued with High intensity interval training on the airdyne. With departmental closure and expiration of VA authorization, will discharge pt from program.    Expected Outcomes  see admission goals and outcomes       ITP Comments: ITP Comments    Row Name 01/21/18 1346 01/30/18 1658 02/28/18 1001 03/25/18 0908 04/22/18 1617   ITP Comments  Dr. Manfred Arch, Medical Director Pulmonary Rehab  Dr. Manfred Arch, Medical Director Pulmonary Rehab  Dr. Manfred Arch, Medical Director Pulmonary Rehab  Dr. Manfred Arch, Medical Director Pulmonary Rehab  Dr. Manfred Arch, Medical Director Pulmonary [place] Name 05/13/18 1545           ITP Comments  Dr. Manfred Arch, Medical Director Pulmonary Rehab          Comments: ITP REVIEW Pt is making expected progress toward personal goals after completing 18 sessions. With Departmental close and VA authorization expiring on 3/31, will discharge pt if departmental exercise is not resumed prior.   Joycelyn Man RN, BSN Cardiac and Pulmonary Rehab RN

## 2018-05-14 ENCOUNTER — Telehealth (HOSPITAL_COMMUNITY): Payer: Self-pay

## 2018-05-14 ENCOUNTER — Encounter (HOSPITAL_COMMUNITY): Payer: Non-veteran care

## 2018-05-16 ENCOUNTER — Encounter (HOSPITAL_COMMUNITY): Payer: Non-veteran care

## 2018-06-04 ENCOUNTER — Telehealth (HOSPITAL_COMMUNITY): Payer: Self-pay | Admitting: *Deleted

## 2018-06-04 NOTE — Addendum Note (Signed)
Encounter addended by: Lance Morin, RN on: 06/04/2018 2:51 PM  Actions taken: Clinical Note Signed

## 2018-06-04 NOTE — Progress Notes (Signed)
Vincent Sutton has been discharged from pulmonary rehab.  The [facility] authorized the program through 05/14/2018.  He was not able to participate the last 2 weeks due to department closure d/t COVID-19 precautions.  He met his program goals and will continue exercising at home on his own.

## 2018-07-25 NOTE — Addendum Note (Signed)
Encounter addended by: Ivonne Andrew, RD on: 07/25/2018 2:30 PM  Actions taken: Flowsheet data copied forward, Visit Navigator Flowsheet section accepted

## 2018-08-12 NOTE — Progress Notes (Signed)
Discharge Progress Report  Patient Details  Name: Vincent Sutton MRN: [id number] Date of Birth: 1960-08-17 Referring Provider:     Pulmonary Rehab Walk Test from 01/22/2018 in [place]  Referring Provider  Dr. Nelda Marseille       Number of Visits: 18 Reason for Discharge:  Patient has met program and personal goals.  Smoking History:  Social History   Tobacco Use  Smoking Status Former Smoker  . Packs/day: 2.00  . Years: 30.00  . Pack years: 60.00  . Types: Cigarettes  . Quit date: 04/08/2013  . Years since quitting: 5.3  Smokeless Tobacco Never Used    Diagnosis:  Chronic obstructive pulmonary disease, unspecified COPD type ([place])  Other emphysema ([place])  ADL [facility]: Pulmonary Assessment Scores    Row Name 02/17/18 2124         ADL [facility]   ADL Phase  Exit        Initial Exercise Prescription:   Discharge Exercise Prescription (Final Exercise Prescription Changes): Exercise Prescription Changes - 04/30/18 1144      Response to Exercise   Blood Pressure (Admit)  120/72    Blood Pressure (Exercise)  126/74    Blood Pressure (Exit)  124/82    Heart Rate (Admit)  78 bpm    Heart Rate (Exercise)  118 bpm    Heart Rate (Exit)  108 bpm    Oxygen Saturation (Admit)  97 %    Oxygen Saturation (Exercise)  91 %    Oxygen Saturation (Exit)  96 %    Rating of Perceived Exertion (Exercise)  15    Perceived Dyspnea (Exercise)  2    Duration  Progress to 45 minutes of aerobic exercise without signs/symptoms of physical distress    Intensity  THRR unchanged      Progression   Progression  Continue to progress workloads to maintain intensity without signs/symptoms of physical distress.      Resistance Training   Training Prescription  Yes    Weight  blue bands    Reps  10-15    Time  10 Minutes      Oxygen   Oxygen  Continuous    Liters  4      Treadmill   MPH  2.4    Grade  3    Minutes  17      Bike   Level  0.6    Minutes  17       NuStep   Level  6    SPM  80    Minutes  17    METs  2.6       Functional Capacity:   Psychological, QOL, Others - Outcomes: PHQ 2/9: Depression screen [facility] 2/9 01/21/2018 12/04/2017 09/19/2017  Decreased Interest 0 0 0  Down, Depressed, Hopeless 1 0 1  PHQ - 2 Score 1 0 1  Altered sleeping 3 3 3   Tired, decreased energy 3 3 3   Change in appetite 1 0 0  Feeling bad or failure about yourself  1 1 1   Trouble concentrating 1 1 3   Moving slowly or fidgety/restless 1 1 1   Suicidal thoughts 0 0 1  PHQ-9 Score 11 9 13   Difficult doing work/chores Somewhat difficult Somewhat difficult Very difficult    Quality of Life:   Personal Goals: Goals established at orientation with interventions provided to work toward goal.    Personal Goals Discharge: Goals and Risk Factor Review    Row Name  02/28/18 1004 03/25/18 0911 04/22/18 1619 05/13/18 1550       Core Components/Risk Factors/Patient Goals Review   Personal Goals Review  Weight Management/Obesity;Improve shortness of breath with ADL's;Increase knowledge of respiratory medications and ability to use respiratory devices properly.;Develop more efficient breathing techniques such as purse lipped breathing and diaphragmatic breathing and practicing self-pacing with activity.  Weight Management/Obesity;Improve shortness of breath with ADL's;Increase knowledge of respiratory medications and ability to use respiratory devices properly.;Develop more efficient breathing techniques such as purse lipped breathing and diaphragmatic breathing and practicing self-pacing with activity.  Weight Management/Obesity;Improve shortness of breath with ADL's;Increase knowledge of respiratory medications and ability to use respiratory devices properly.;Develop more efficient breathing techniques such as purse lipped breathing and diaphragmatic breathing and practicing self-pacing with activity.  Weight Management/Obesity;Improve shortness of breath with  ADL's;Increase knowledge of respiratory medications and ability to use respiratory devices properly.;Develop more efficient breathing techniques such as purse lipped breathing and diaphragmatic breathing and practicing self-pacing with activity.    Review  Pt has completed 5 excercise sessions and 2 education sessions. Pt has began to see good weight loss with a decrease of 3.4 kg since beginning exercise on 12/17. He is able to demonstrate good PLB technique with staff promting. Pt will strive to be more independent with breathing technique. His workloads have already been increases to 2.0/2.0 on the treadmill, level 6 on the nustep and 0.8 on the airdyne. Will continue to monitor for improvements in the next 30 day review.  Pt has completed 10 excercise sessions and 5 education sessions. Pt has began to see good weight loss with a decrease of 3.0 kg since beginning exercise on 12/17, though he has had some mild fluctuation. He is able to demonstrate good PLB technique with staff promting. Pt will strive to be more independent with breathing technique. His workloads have remained 2.0/2.0 on the treadmill, level 5 on the nustep and has increased to 1.0 on the airdyne. Will continue to monitor for improvements in the next 30 day review.  Pt has completed 16 excercise sessions and 8 education sessions. Pt had begun to see good weight loss with a decrease of 3.0 kg since beginning exercise on 12/17, but weight has increased by 1.6 kg over the last couple weeks from his starting weight. Pt states this is related to the multiple stressors. He is able to demonstrate good PLB technique with staff promting. Pt will strive to be more independent with breathing technique. His workloads have increased to 2.2/3.0 on the treadmill, level 5 on the nustep and has transitioned to High intensity interval training on the airdyne. Will continue to monitor for improvements in the next 30 day review.  Pt has completed 18 excercise  sessions and 9 education sessions. Pt weight has fluctuated and after 18 sessions is back up to his starting weight. Pt states this is related to the multiple stressors. He is able to demonstrate good PLB technique with staff promting. His workloads have increased to 2.4/3.0 on the treadmill, level 6 on the nustep and has continued with High intensity interval training on the airdyne. With departmental closure and expiration of VA authorization, will discharge pt from program.    Expected Outcomes  see admission goals and outcomes  see admission goals and outcomes  see admission goals and outcomes  see admission goals and outcomes       Exercise Goals and Review:   Exercise Goals Re-Evaluation: Exercise Goals Re-Evaluation    [place] Name 02/26/18 [phone]  03/26/18 0655 04/23/18 0713 05/13/18 1007       Exercise Goal Re-Evaluation   Exercise Goals Review  Increase Physical Activity;Increase Strength and Stamina;Able to understand and use rate of perceived exertion (RPE) scale;Able to understand and use Dyspnea scale;Knowledge and understanding of Target Heart Rate Range (THRR);Understanding of Exercise Prescription  Increase Physical Activity;Increase Strength and Stamina;Able to understand and use rate of perceived exertion (RPE) scale;Able to understand and use Dyspnea scale;Knowledge and understanding of Target Heart Rate Range (THRR);Understanding of Exercise Prescription  Increase Physical Activity;Increase Strength and Stamina;Able to understand and use rate of perceived exertion (RPE) scale;Able to understand and use Dyspnea scale;Knowledge and understanding of Target Heart Rate Range (THRR);Understanding of Exercise Prescription  Increase Physical Activity;Increase Strength and Stamina;Able to understand and use rate of perceived exertion (RPE) scale;Able to understand and use Dyspnea scale;Knowledge and understanding of Target Heart Rate Range (THRR);Understanding of Exercise Prescription    Comments   Pt just completed the program in October and was referred back. Pt has the ability to exercise at home, but lacks motivation. Pt gained quite a bit of weight and came back in deconditioned. Pt is currently exercising at 2.87 METs on the treadmill. I will be more aggressive with his progression as this is his second time through the program in such a short time. Will continue to monitor and progress as able.   Pt is progressing slower than I would like considering that he just completed the program a few months ago. I have sent a fax to Dr. Gwenette Greet from the [employer] seeking permission to engage pt in HIIT exercise while on the airdyne bike in an attempt to make the pt become more engaged. He is currently exercising at 3.08 METs while on the treadmill. Will continue to monitor and progress as able.   Pt is beginning to progress at a level that I would like. Pt has begun doing a HIIT protocol on the Airdyne bike. Pt exercises at 3.6 METs while on the treadmill. Pt gained ~10 lbs in the month of Feb. which is concerning. I notified Rodman Pickle, our dietician, who consulted with him. Pt is able to exercise at home, but I have my doubts that he actually does. Pt has the abilities and all the knowledge that he needs, but I feel that he lacks the motivation to makes changes without a group exercise setting. Will continue to monitor and progress as able.   Pt's VA authorization date runs out at the end of the month, so it is unlikely that pt will return back to the program. Pt did not progress as well as I would have hoped through the program considering he had recently just completed the program. Pt lacked motivation and put on ~10 pounds in a 1 month span due to poor dietary decisions. Will follow up with pt this week when I recieve updates and discuss his future with him.    Expected Outcomes  Through exercise at home and at rehab, patient will increase physical capacity and ADL's will become easier to perform.    Through exercise at rehab and at home, the patient will decrease shortness of breath with daily activities and feel confident in carrying out an exercise regime at home.   Through exercise at rehab and at home, the patient will decrease shortness of breath with daily activities and feel confident in carrying out an exercise regime at home.   Through exercise at rehab and at home, the patient will decrease shortness  of breath with daily activities and feel confident in carrying out an exercise regime at home.        Nutrition & Weight - Outcomes:    Nutrition: Nutrition Therapy & Goals - 07/25/18 1429      Nutrition Therapy   Diet  general healthful      Personal Nutrition Goals   Nutrition Goal  Identify food quantities necessary to achieve wt loss of  -2# per week to a goal wt loss of 2.7-10.9 kg (6-24 lb) at graduation from pulmonary rehab.      Intervention Plan   Intervention  Prescribe, educate and counsel regarding individualized specific dietary modifications aiming towards targeted core components such as weight, hypertension, lipid management, diabetes, heart failure and other comorbidities.    Expected Outcomes  Short Term Goal: Understand basic principles of dietary content, such as calories, fat, sodium, cholesterol and nutrients.;Long Term Goal: Adherence to prescribed nutrition plan.       Nutrition Discharge: Nutrition Assessments - 07/25/18 1430      Rate Your Plate Scores   Pre Score  37    Post Score  --   post survey not completed       Education Questionnaire Score:   Goals reviewed with patient; copy given to patient.

## 2018-08-12 NOTE — Addendum Note (Signed)
Encounter addended by: Lance Morin, RN on: 08/12/2018 9:12 AM  Actions taken: Clinical Note Signed, Episode resolved

## 2019-10-11 ENCOUNTER — Other Ambulatory Visit: Payer: Self-pay

## 2019-10-11 ENCOUNTER — Ambulatory Visit
Admission: EM | Admit: 2019-10-11 | Discharge: 2019-10-11 | Disposition: A | Discharge status: Home / Self Care | Payer: Non-veteran care | Attending: Physician Assistant | Admitting: Physician Assistant

## 2019-10-11 DIAGNOSIS — R05 Cough: Secondary | ICD-10-CM | POA: Diagnosis not present

## 2019-10-11 DIAGNOSIS — R52 Pain, unspecified: Secondary | ICD-10-CM

## 2019-10-11 DIAGNOSIS — R059 Cough, unspecified: Secondary | ICD-10-CM

## 2019-10-11 MED ORDER — IPRATROPIUM-ALBUTEROL 0.5-2.5 (3) MG/3ML IN SOLN: 3.0000 mL | Freq: Four times a day (QID) | RESPIRATORY_TRACT | 0 refills | Status: DC | PRN
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
## 2019-10-11 NOTE — Discharge Instructions (Addendum)
COVID PCR testing ordered. I would like you to quarantine until testing results. Albuterol as needed for shortness of breath, cough, wheezing. Can add on duoneb if needed. Please contact pulmonologist of current symptoms, if worsening, oxygen dropping, go to the ED for further evaluation.

## 2019-10-11 NOTE — ED Triage Notes (Signed)
Patient presents with non-productive cough, fatigue and body aches that started today.

## 2019-10-13 LAB — SARS-COV-2, NAA 2 DAY TAT

## 2019-10-13 LAB — NOVEL CORONAVIRUS, NAA: SARS-CoV-2, NAA: NOT DETECTED

## 2020-11-22 ENCOUNTER — Encounter: Payer: Self-pay | Admitting: *Deleted

## 2020-11-22 ENCOUNTER — Other Ambulatory Visit: Payer: Self-pay

## 2020-11-22 ENCOUNTER — Encounter (HOSPITAL_COMMUNITY): Payer: Self-pay | Admitting: *Deleted

## 2020-11-22 ENCOUNTER — Encounter: Payer: Self-pay | Admitting: Pulmonary Disease

## 2020-11-22 ENCOUNTER — Ambulatory Visit (INDEPENDENT_AMBULATORY_CARE_PROVIDER_SITE_OTHER): Payer: No Typology Code available for payment source | Admitting: Pulmonary Disease

## 2020-11-22 VITALS — BP 124/70 | HR 79 | Temp 98.3°F | Ht 68.0 in | Wt 226.8 lb

## 2020-11-22 DIAGNOSIS — J9611 Chronic respiratory failure with hypoxia: Secondary | ICD-10-CM | POA: Insufficient documentation

## 2020-11-22 DIAGNOSIS — G4733 Obstructive sleep apnea (adult) (pediatric): Secondary | ICD-10-CM | POA: Diagnosis not present

## 2020-11-22 DIAGNOSIS — J449 Chronic obstructive pulmonary disease, unspecified: Secondary | ICD-10-CM | POA: Diagnosis not present

## 2020-11-22 MED ORDER — TRELEGY ELLIPTA 100-62.5-25 MCG/INH IN AEPB: 1.0000 | INHALATION_SPRAY | Freq: Every day | RESPIRATORY_TRACT | 0 refills | Status: DC

## 2020-11-22 MED ORDER — TRELEGY ELLIPTA 100-62.5-25 MCG/INH IN AEPB: 1.0000 | INHALATION_SPRAY | Freq: Every day | RESPIRATORY_TRACT | 5 refills | Status: DC

## 2020-11-22 NOTE — Patient Instructions (Addendum)
Shortness of breath - worse Chronic hypoxemic respiratory failure - multifactorial --Echocardiogram ([place] of heart) --Sleep study --Add steroid to inhaler regimen (Trelegy) --Continue oxygen for goal SpO2 >88% with activity and sleep  COPD, very severe (FEV 29% in 2016) Emphysema GOLD class D --STOP Stiolto  --START Trelegy 100-62.5-25 mcg ONE puff ONCE a day. Print and digital order placed --CONTINUE Duonebs as needed. --External Pulmonary Rehab at [facility] (Print)  Severe OSA --Split night sleep study  CPAP CLEANING INSTRUCTIONS Along with proper CPAP cleaning it is recommended that you replace your mask, tubing and filters once very 3 months and more frequently if you are sick.  DAILY CLEANING Do NOT use moisturizing soaps, bleach, scented oils, chlorine, or alcohol-based solutions to clean your supplies. These solutions may cause irritation to your skin and lungs and may reduce the life of your products. Dawn [employer] or Comparable works best for daily cleaning.  **If you've been sick, it's smart to wash your mask, tubing, humidifier and filter daily until your cold, flu or virus symptoms are gone. That can help reduce the amount of time you spend under the weather.  Before using your mask -wash your face daily with soap and water to remove excess facial oils.  Wipe down your mask (including areas that come in contact with your skin) using a damp towel with soap and warm water. This will remove any oils, dead skin cells, and sweat on the mask that can affect the quality of the seal. Gently rinse with a clean towel and let the mask air-dry out of direct sunlight.  You can also use unscented baby wipes or pre-moistened towels designed specifically for cleaning CPAP masks, which are available on-line. DO NOT USE CLOROX OR DISINFECTING WIPES.  If your unit has a humidifier, empty any leftover water instead of letting in sit in the unit all day. Refill the humidifier with clean, distilled  water right before bedtime for optimal use  WEEKLY (OR MORE FREQUENT) CLEANING Your mask and tubing need a full bath at least once a week to keep it free of dust, bacteria, and germs. (During COVID-19 or any other flu/virus we recommend more frequent cleaning)  Clean the CPAP tubing, nasal mask, and headgear in a bathroom sink filled with warm water and a few drops of ammonia-free, mild dish detergent. Avoid using stronger cleaning products, as they may damage the mask or leave harmful residue.   Swirl all parts around for about five minutes, rinse well and let air dry during the day. Hang the tubing over the shower rod, on a towel rack or in the laundry room to ensure all the water drips out.  The mask and headgear can be air-dried on a towel or hung on a hook or hanger.  You should also wipe down your CPAP machine with a damp cloth. Ensure the unit is unplugged. The towel shouldn't be too damp or wet, as water could get into the machine.  Clean the filter by removing it and rinsing it in warm tap water. Run it under the water and squeeze to make sure there is no dust. Then blot down the filter with a towel.  DO NOT wash your machine's white filter, if one is present--those are disposable and you should replace white filter every two weeks. If you are recovering from being sick, we recommend changing the filter sooner.  If your CPAP has a humidifier, that also needs to be cleaned weekly. Empty any remaining water and then  wash the water chamber in the sink with warm soapy water. Rinse well and drain out as much of the water as possible. Let the chamber air-dry before placing it back into the CPAP unit.  Every other week you should disinfect the humidifier. Do that by soaking it in a solution of one-part vinegar to five parts water for 30 minutes, thoroughly rinsing and then placing in your dishwasher's top rack for washing. And keep it clean by using only distilled water to prevent mineral  deposits that can build up and cause damage to your machine.  IMPORTANT TIPS Make caring for your CPAP equipment part of your morning routine. Keep machine and accessories out of direct sunlight to avoid damaging them. Never use bleach to clean accessories. Place machine on a level surface and away from curtains that may interfere with the air intake.

## 2020-11-22 NOTE — Progress Notes (Signed)
Received referral from Dr [name] for this veteran to participate in pulmonary rehab with the diagnosis of COPD unspecified. Clinical reviewed new consult appt on 10/3 Pulmonary office note.  Pt with Covid Risk Score - 4.  [place] is well known to pulmonary rehab staff from previous participation in both cardiac and pulmonary. Pt has comprehensive  VA authorization- [id number] which includes pulmonary rehab.  Will determine dates for this authorization. Pt appropriate for scheduling for Pulmonary rehab when able as there is a wait list.  Will forward to support staff for scheduling and verification of authorization dates.  Cherre Huger, BSN Cardiac and Training and development officer
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
## 2020-11-22 NOTE — Progress Notes (Signed)
error 

## 2020-12-08 ENCOUNTER — Encounter (HOSPITAL_COMMUNITY): Payer: Self-pay | Admitting: Emergency Medicine

## 2020-12-08 ENCOUNTER — Emergency Department (HOSPITAL_COMMUNITY): Payer: No Typology Code available for payment source

## 2020-12-08 ENCOUNTER — Emergency Department (HOSPITAL_COMMUNITY)
Admission: EM | Admit: 2020-12-08 | Discharge: 2020-12-08 | Disposition: A | Discharge status: Home / Self Care | Payer: No Typology Code available for payment source | Attending: Emergency Medicine | Admitting: Emergency Medicine

## 2020-12-08 DIAGNOSIS — R0789 Other chest pain: Secondary | ICD-10-CM | POA: Insufficient documentation

## 2020-12-08 DIAGNOSIS — R202 Paresthesia of skin: Secondary | ICD-10-CM | POA: Diagnosis not present

## 2020-12-08 DIAGNOSIS — Z85118 Personal history of other malignant neoplasm of bronchus and lung: Secondary | ICD-10-CM | POA: Diagnosis not present

## 2020-12-08 DIAGNOSIS — Z87891 Personal history of nicotine dependence: Secondary | ICD-10-CM | POA: Diagnosis not present

## 2020-12-08 DIAGNOSIS — R079 Chest pain, unspecified: Secondary | ICD-10-CM | POA: Diagnosis present

## 2020-12-08 DIAGNOSIS — J449 Chronic obstructive pulmonary disease, unspecified: Secondary | ICD-10-CM | POA: Insufficient documentation

## 2020-12-08 DIAGNOSIS — Z7951 Long term (current) use of inhaled steroids: Secondary | ICD-10-CM | POA: Diagnosis not present

## 2020-12-08 LAB — CBC WITH DIFFERENTIAL/PLATELET
Abs Immature Granulocytes: 0.04 10*3/uL (ref 0.00–0.07)
Basophils Absolute: 0.1 10*3/uL (ref 0.0–0.1)
Basophils Relative: 1 %
Eosinophils Absolute: 0.1 10*3/uL (ref 0.0–0.5)
Eosinophils Relative: 2 %
HCT: 42.8 % (ref 39.0–52.0)
Hemoglobin: 13.7 g/dL (ref 13.0–17.0)
Immature Granulocytes: 1 %
Lymphocytes Relative: 17 %
Lymphs Abs: 1.2 10*3/uL (ref 0.7–4.0)
MCH: 30.1 pg (ref 26.0–34.0)
MCHC: 32 g/dL (ref 30.0–36.0)
MCV: 94.1 fL (ref 80.0–100.0)
Monocytes Absolute: 0.6 10*3/uL (ref 0.1–1.0)
Monocytes Relative: 9 %
Neutro Abs: 4.9 10*3/uL (ref 1.7–7.7)
Neutrophils Relative %: 70 %
Platelets: 378 10*3/uL (ref 150–400)
RBC: 4.55 MIL/uL (ref 4.22–5.81)
RDW: 13.5 % (ref 11.5–15.5)
WBC: 6.8 10*3/uL (ref 4.0–10.5)
nRBC: 0 % (ref 0.0–0.2)

## 2020-12-08 LAB — COMPREHENSIVE METABOLIC PANEL
ALT: 20 U/L (ref 0–44)
AST: 18 U/L (ref 15–41)
Albumin: 4.1 g/dL (ref 3.5–5.0)
Alkaline Phosphatase: 79 U/L (ref 38–126)
Anion gap: 6 (ref 5–15)
BUN: 14 mg/dL (ref 6–20)
CO2: 33 mmol/L — ABNORMAL HIGH (ref 22–32)
Calcium: 9.5 mg/dL (ref 8.9–10.3)
Chloride: 97 mmol/L — ABNORMAL LOW (ref 98–111)
Creatinine, Ser: 0.81 mg/dL (ref 0.61–1.24)
GFR, Estimated: >60 mL/min (ref >60)
Glucose, Bld: 162 mg/dL — ABNORMAL HIGH (ref 70–99)
Potassium: 4 mmol/L (ref 3.5–5.1)
Sodium: 136 mmol/L (ref 135–145)
Total Bilirubin: 0.5 mg/dL (ref 0.3–1.2)
Total Protein: 7.2 g/dL (ref 6.5–8.1)

## 2020-12-08 LAB — TROPONIN I (HIGH SENSITIVITY)
Troponin I (High Sensitivity): 6 ng/L (ref <18)
Troponin I (High Sensitivity): 5 ng/L (ref <18)

## 2020-12-08 MED ORDER — IOHEXOL 350 MG/ML SOLN
70.0000 mL | Freq: Once | INTRAVENOUS | Status: AC | PRN
  Administered 2020-12-08: 70 mL via INTRAVENOUS

## 2020-12-08 MED ORDER — OXYCODONE-ACETAMINOPHEN 5-325 MG PO TABS: 1.0000 | ORAL_TABLET | Freq: Four times a day (QID) | ORAL | 0 refills | Status: DC | PRN

## 2020-12-08 MED ORDER — ALBUTEROL SULFATE HFA 108 (90 BASE) MCG/ACT IN AERS: 2.0000 | INHALATION_SPRAY | RESPIRATORY_TRACT | Status: DC | PRN

## 2020-12-08 MED ORDER — ONDANSETRON HCL 4 MG/2ML IJ SOLN
4.0000 mg | Freq: Once | INTRAMUSCULAR | Status: AC
  Administered 2020-12-08: 4 mg via INTRAVENOUS
  Filled 2020-12-08: qty 2

## 2020-12-08 MED ORDER — MORPHINE SULFATE (PF) 4 MG/ML IV SOLN
4.0000 mg | Freq: Once | INTRAVENOUS | Status: AC
  Administered 2020-12-08: 4 mg via INTRAVENOUS
  Filled 2020-12-08: qty 1

## 2020-12-08 NOTE — ED Triage Notes (Addendum)
Patient complains of sudden onset of stabbing left chest pain that is only present when he moves his left arm, also reports left arm numbness that was present on waking. Denies cardiac history, reports history of thyroid disease, lung cancer, and COPD, wears 3L O2 [place] as needed during exertion.

## 2020-12-08 NOTE — Discharge Instructions (Signed)
All the blood work to your heart and your EKG were normal today.  No evidence of anything abnormal on your CAT scan other than scar tissue in your lungs.  No sign of blood clots or problems with your aorta.  If you start having fever, shortness of breath, coughing up any blood or sputum or passing out you should return to the emergency room.  Try to avoid any heavy lifting.

## 2020-12-08 NOTE — ED Notes (Signed)
On home O2... 3lpm [place].

## 2020-12-08 NOTE — ED Provider Notes (Signed)
Emergency Medicine Provider Triage Evaluation Note  Vincent Sutton , a 60 y.o. male  was evaluated in triage.  Pt complains of chest pain that began this morning.  History of COPD and lung cancer.  Lobectomy on left side.  Wears 3 L of oxygen with exertion.  No increased oxygen requirement  Review of Systems  Positive: Pain that he describes as stabbing.  Also intermittent left arm numbness. Negative: Syncope  Physical Exam  BP 130/74 (BP Location: Right Arm)   Pulse 73   Temp 98 F (36.7 C) (Oral)   Resp 18   SpO2 100%  Gen:   Awake, no distress   Resp:  Normal effort  MSK:   Moves extremities without difficulty  Other:    Medical Decision Making  Medically screening exam initiated at 12:00 PM.  Appropriate orders placed.  Vincent Sutton was informed that the remainder of the evaluation will be completed by another provider, this initial triage assessment does not replace that evaluation, and the importance of remaining in the ED until their evaluation is complete.     Darliss Ridgel 12/08/20 1209    Blanchie Dessert, MD 12/15/20 1537

## 2020-12-08 NOTE — ED Notes (Signed)
Pt was insistent on ambulating to bathroom. Pt was able to ambulate independently on baseline O2 without need of assistance.
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
## 2020-12-08 NOTE — ED Notes (Signed)
Patient transported to CT 

## 2020-12-08 NOTE — ED Notes (Addendum)
Pt reports new onset L leg numbness and decreased sensation to L leg since arrival to ER. No unilateral weakness, no drift noted by this RN. Maryan Rued, MD made aware. MD at bedside at this time.

## 2020-12-09 ENCOUNTER — Telehealth: Payer: Self-pay | Admitting: Pulmonary Disease

## 2020-12-09 MED ORDER — TRELEGY ELLIPTA 100-62.5-25 MCG/ACT IN AEPB: 1.0000 | INHALATION_SPRAY | Freq: Every day | RESPIRATORY_TRACT | 3 refills | Status: DC

## 2020-12-09 NOTE — Telephone Encounter (Signed)
I have called the pt and he is aware of the trelegy has been sent to the pharmacy.  Nothing further is needed.

## 2020-12-21 ENCOUNTER — Ambulatory Visit (HOSPITAL_COMMUNITY): Payer: No Typology Code available for payment source | Attending: Cardiology

## 2020-12-21 ENCOUNTER — Other Ambulatory Visit: Payer: Self-pay

## 2020-12-21 DIAGNOSIS — J449 Chronic obstructive pulmonary disease, unspecified: Secondary | ICD-10-CM

## 2020-12-21 LAB — ECHOCARDIOGRAM COMPLETE
Area-P 1/2: 2.42 cm2
S' Lateral: 3.7 cm

## 2020-12-27 ENCOUNTER — Telehealth: Payer: Self-pay | Admitting: Pulmonary Disease

## 2020-12-27 NOTE — Telephone Encounter (Signed)
Ok to change from Trelegy to :  Wixela 100-50 mcg ONE puff TWICE a day AND Spiriva TWO puffs ONCE a day

## 2020-12-27 NOTE — Telephone Encounter (Signed)
[facility] calling from [place]- pt has been trying to get Trelegy since 10/3. This medication is non formulary. They require an alternative medication to be tried beforeTrelegy would be approved. The 2 alternatives for Trelegy are [facility] [phone] ext [phone]  Fax [phone] Order can be escribed if for Wixela and Spiriva

## 2020-12-27 NOTE — Telephone Encounter (Signed)
JE please advise. Thanks  

## 2020-12-28 MED ORDER — SPIRIVA RESPIMAT 2.5 MCG/ACT IN AERS: 2.0000 | INHALATION_SPRAY | Freq: Every day | RESPIRATORY_TRACT | 6 refills | Status: DC

## 2020-12-28 MED ORDER — FLUTICASONE-SALMETEROL 100-50 MCG/ACT IN AEPB: 1.0000 | INHALATION_SPRAY | Freq: Two times a day (BID) | RESPIRATORY_TRACT | 6 refills | Status: DC

## 2020-12-28 NOTE — Telephone Encounter (Signed)
I have updated the med list for the pt.  I have sent the new meds to the [facility] pharmacy.  Nothing further is needed.

## 2020-12-29 ENCOUNTER — Telehealth: Payer: Self-pay | Admitting: Pulmonary Disease

## 2020-12-29 NOTE — Telephone Encounter (Signed)
LMTCB  Please see previous phone notes 12/27/20

## 2020-12-30 NOTE — Telephone Encounter (Signed)
Margaretha Seeds, MD     6:13 PM Note Ok to change from Trelegy to :   Wixela 100-50 mcg ONE puff TWICE a day AND Spiriva TWO puffs ONCE a day      Patient is aware of above message and voiced his understanding.  Rx sent.  Nothing further needed.

## 2021-01-05 ENCOUNTER — Telehealth (HOSPITAL_COMMUNITY): Payer: Self-pay

## 2021-01-05 NOTE — Telephone Encounter (Signed)
Called the [place] in [place] and advised them of the pulmonary rehab scheduled date and time and they stated they will fax over the new auth.

## 2021-01-05 NOTE — Telephone Encounter (Signed)
Called patient to see if he was interested in participating in the Pulmonary Rehab Program. Patient stated yes. Patient will come in for orientation on 02/04/2021@10 :30am and will attend the 10:15am exercise class.   Tourist information centre manager.

## 2021-01-06 ENCOUNTER — Other Ambulatory Visit: Payer: Self-pay

## 2021-01-06 ENCOUNTER — Ambulatory Visit (HOSPITAL_BASED_OUTPATIENT_CLINIC_OR_DEPARTMENT_OTHER): Payer: No Typology Code available for payment source | Attending: Pulmonary Disease | Admitting: Pulmonary Disease

## 2021-01-06 DIAGNOSIS — J9611 Chronic respiratory failure with hypoxia: Secondary | ICD-10-CM | POA: Diagnosis not present

## 2021-01-06 DIAGNOSIS — G4733 Obstructive sleep apnea (adult) (pediatric): Secondary | ICD-10-CM | POA: Diagnosis present

## 2021-01-06 DIAGNOSIS — J449 Chronic obstructive pulmonary disease, unspecified: Secondary | ICD-10-CM

## 2021-01-07 DIAGNOSIS — J449 Chronic obstructive pulmonary disease, unspecified: Secondary | ICD-10-CM | POA: Diagnosis not present

## 2021-01-07 NOTE — Procedures (Signed)
    Patient Name: Vincent Sutton, Vincent Sutton Date: 01/06/2021 Gender: Male D.O.B: 04-05-1960 Age (years): 59 Referring Provider: Chi Rodman Pickle Height (inches): 78 Interpreting Physician: Chesley Mires MD, ABSM Weight (lbs): 225 RPSGT: Jorge Ny BMI: 34 MRN: [id number] Neck Size: 18.00  CLINICAL INFORMATION Sleep Study Type: NPSG  Indication for sleep study: COPD, Obesity, Snoring.  He had sleep study in 04/02/11 with an AHI of 91.5.  At that time his weight was 252 lbs.  Epworth Sleepiness Score: 10  SLEEP STUDY TECHNIQUE As per the AASM Manual for the Scoring of Sleep and Associated Events v2.3 (April 2016) with a hypopnea requiring 4% desaturations.  The channels recorded and monitored were frontal, central and occipital EEG, electrooculogram (EOG), submentalis EMG (chin), nasal and oral airflow, thoracic and abdominal wall motion, anterior tibialis EMG, snore microphone, electrocardiogram, and pulse oximetry.  MEDICATIONS Medications self-administered by patient taken the night of the study : N/A  SLEEP ARCHITECTURE The study was initiated at 10:32:22 PM and ended at 4:54:29 AM.  Sleep onset time was 10.9 minutes and the sleep efficiency was 68.2%%. The total sleep time was 260.5 minutes.  Stage REM latency was 64.5 minutes.  The patient spent 15.4%% of the night in stage N1 sleep, 70.4%% in stage N2 sleep, 0.0%% in stage N3 and 14.2% in REM.  Alpha intrusion was absent.  Supine sleep was 13.24%.  RESPIRATORY PARAMETERS The overall apnea/hypopnea index (AHI) was 0.0 per hour. There were 0 total apneas, including 0 obstructive, 0 central and 0 mixed apneas. There were 0 hypopneas and 35 RERAs.  The AHI during Stage REM sleep was 0.0 per hour.  AHI while supine was 0.0 per hour.  The mean oxygen saturation was 96.6%. The minimum SpO2 during sleep was 93.0%.  He wore 3 liters supplemental oxygen during the study.  moderate snoring was noted during this  study.  CARDIAC DATA The 2 lead EKG demonstrated sinus rhythm. The mean heart rate was 67.2 beats per minute. Other EKG findings include: PVCs.  LEG MOVEMENT DATA The total PLMS were 0 with a resulting PLMS index of 0.0. Associated arousal with leg movement index was 4.4 .  IMPRESSIONS - No significant obstructive sleep apnea occurred during this study (AHI = 0.0/h). - The patient had minimal or no oxygen desaturation during the study (Min O2 = 93.0%) - The patient snored with moderate snoring volume.  DIAGNOSIS - Nocturnal Hypoxemia (G47.36)  RECOMMENDATIONS - Suspect his previously sleep apnea resolved after weight loss. - Continue 3 liters supplemental oxygen with sleep. - Avoid alcohol, sedatives and other CNS depressants that may worsen sleep apnea and disrupt normal sleep architecture. - Sleep hygiene should be reviewed to assess factors that may improve sleep quality. - Weight management and regular exercise should be initiated or continued if appropriate.  [Electronically signed] 01/07/2021 08:20 AM  Chesley Mires MD, ABSM Diplomate, American Board of Sleep Medicine   NPI: [id number] [place] PH: [phone]   FX: [phone] [place]

## 2021-01-12 ENCOUNTER — Telehealth: Payer: Self-pay | Admitting: Pulmonary Disease

## 2021-01-12 NOTE — Telephone Encounter (Signed)
Pt was switched from Trelegy to [place] and Spiriva due to Trelegy being nonformulary with pt's insurance. Called and spoke with Denyse Amass at [place] letting him know this info and he verbalized understanding. Nothing further needed.

## 2021-01-19 ENCOUNTER — Ambulatory Visit: Payer: No Typology Code available for payment source | Admitting: Pulmonary Disease

## 2021-01-19 ENCOUNTER — Encounter: Payer: Self-pay | Admitting: Pulmonary Disease

## 2021-01-19 ENCOUNTER — Other Ambulatory Visit: Payer: Self-pay

## 2021-01-19 ENCOUNTER — Ambulatory Visit (INDEPENDENT_AMBULATORY_CARE_PROVIDER_SITE_OTHER): Payer: No Typology Code available for payment source | Admitting: Pulmonary Disease

## 2021-01-19 VITALS — BP 130/80 | HR 85 | Temp 98.0°F | Ht 68.0 in | Wt 226.4 lb

## 2021-01-19 DIAGNOSIS — J441 Chronic obstructive pulmonary disease with (acute) exacerbation: Secondary | ICD-10-CM | POA: Diagnosis not present

## 2021-01-19 MED ORDER — PREDNISONE 20 MG PO TABS: 40.0000 mg | ORAL_TABLET | Freq: Every day | ORAL | 0 refills | Status: AC

## 2021-01-19 MED ORDER — METHYLPREDNISOLONE ACETATE 80 MG/ML IJ SUSP
80.0000 mg | Freq: Once | INTRAMUSCULAR | Status: AC
  Administered 2021-01-19: 80 mg via INTRAMUSCULAR

## 2021-01-19 NOTE — Patient Instructions (Signed)
COPD, very severe (FEV 29% in 2016) Emphysema GOLD class D COPD exacerbation --Depo shot in-clinic --Prednisone 40 mg x 5 days --CONTINUE Wixela 100-50 mcg ONE puff TWICE a day --CONTINUE Spiriva 2.5 TWO puffs ONCE a day --CONTINUE Duonebs as needed. --START Pulmonary rehab  Follow-up with me in 3 months
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
## 2021-01-21 ENCOUNTER — Encounter: Payer: Self-pay | Admitting: Pulmonary Disease

## 2021-01-24 ENCOUNTER — Encounter: Payer: Self-pay | Admitting: Pulmonary Disease

## 2021-01-24 MED ORDER — PREDNISONE 10 MG PO TABS: ORAL_TABLET | ORAL | 0 refills | Status: AC

## 2021-01-24 NOTE — Telephone Encounter (Signed)
Will treat with longer prednisone taper as his symptoms stabilize as he is restarted on his bronchodilators. If his symptoms remain persistent, will need to evaluate again in-office or video visit.  Prednisone has been sent in. No further action.

## 2021-01-24 NOTE — Telephone Encounter (Signed)
Called pt due to Vincent Sutton received. Pt said that he finished prednisone today 12/5 and states that he is still having problems with his breathing. States it is about the same as he cannot tell that the meds have made any difference.  Asked pt if he was wheezing any and he states that he does have a mild wheeze. Pt said that he is coughing and getting up phlegm that is clear in color.  Pt denies any complaints of fever.  Pt said that he is having to use his rescue inhaler at least 2-3 times a day. Pt said that he has also done at least 2 breathing treatments a day but states that he cannot tell that they have helped any.  Pt said that he is using his spiriva and wixela as prescribed.  Stated to pt that we would send this to Dr. [name] for recommendations and call him back once hearing from her and he verbalized understanding. Dr. [name], please advise.

## 2021-02-03 ENCOUNTER — Telehealth (HOSPITAL_COMMUNITY): Payer: Self-pay | Admitting: *Deleted

## 2021-02-03 NOTE — Telephone Encounter (Addendum)
Called Vincent Sutton to confirm his [facility] orientation appointment. He states that he is coming. We discussed Covid precautions, wearing a mask, proper shoes, directions to the department and our contact number.Pt voices understanding.

## 2021-02-04 ENCOUNTER — Encounter (HOSPITAL_COMMUNITY): Payer: Self-pay

## 2021-02-04 ENCOUNTER — Encounter (HOSPITAL_COMMUNITY)
Admission: RE | Admit: 2021-02-04 | Discharge: 2021-02-04 | Disposition: A | Discharge status: Home / Self Care | Payer: No Typology Code available for payment source | Source: Ambulatory Visit | Attending: Pulmonary Disease | Admitting: Pulmonary Disease

## 2021-02-04 ENCOUNTER — Other Ambulatory Visit: Payer: Self-pay

## 2021-02-04 VITALS — BP 114/60 | HR 89 | Ht 68.0 in | Wt 226.2 lb

## 2021-02-04 DIAGNOSIS — J449 Chronic obstructive pulmonary disease, unspecified: Secondary | ICD-10-CM | POA: Insufficient documentation
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
## 2021-02-04 NOTE — Progress Notes (Addendum)
Vincent Sutton 60 y.o. male Pulmonary Rehab Orientation Note This patient who was referred to Pulmonary Rehab by Dr. [name] with the diagnosis of COPD, unspecified arrived today in Cardiac and Pulmonary Rehab. He arrived ambulatory normal gait. He does carry portable oxygen. [facility] is the provider for their DME. Per pt, Vincent Sutton uses oxygen continuously. Color good, skin warm and dry. Patient is oriented to time and place. Patient's medical history, psychosocial health, and medications reviewed. Psychosocial assessment reveals pt lives with his wife and two grandchildren. Vincent Sutton is currently retired from [employer]. Vincent Sutton enjoys working on various types of engines in his free time. Pt reports his stress level is moderate. Vincent Sutton describes himself as an over-thinker. He was prescribed anxiety medication but is not taking it. Pt does not exhibit signs of depression. PHQ2/9 score 3/8. His score is affected by pulmonary disease and age. Vincent Sutton shows good  coping skills with positive outlook on life. Vincent Sutton mentioned that he does meditation when stressed. Offered emotional support and reassurance. Will continue to monitor. Physical assessment performed by Vincent Small, RN. Please see note. Vincent Sutton reports he  does take most medications as prescribed. He mentioned that he is not taking is anxiety medication. Patient states that he has followed a keto diet in the past but is now on a regular diet. The patient has been trying to lose weight through a healthy diet and exercise program. Pt's weight will be monitored closely. Demonstration and practice of PLB using pulse oximeter. Vincent Sutton able to return demonstration satisfactorily. Safety and hand hygiene in the exercise area reviewed with patient. Vincent Sutton voices understanding of the information reviewed. Department expectations discussed with patient and achievable goals were set. The patient shows enthusiasm about attending the program and we look forward to  working with Vincent Sutton. Vincent Sutton completed a 6 min walk test today and is scheduled to begin exercise on 02/08/21 at 10:15 am.   [id number] Sheppard Plumber, MS, ACSM-CEP

## 2021-02-07 NOTE — Progress Notes (Signed)
Pulmonary Rehab Orientation Physical Assessment Note  Physical assessment reveals  Pt is alert and oriented x 3.  Heart rate is normal, breath sounds diminished and tight severe expiratory wheezing heard diffusely throughout both lungs, severe inspiratory wheezing heard diffusely throughout both lungs. Advised pt to use his nebulizer when he returns home and continue to use during this acute phase as prescribed while he is tapering his prednisone  Reports productive at times cough. Bowel sounds present.  Pt denies  abdominal discomfort, nausea, vomiting or diarrhea. Grip strength equal, strong. Distal pulses palpable with no swelling to lower extremities. Cherre Huger, BSN Cardiac and Training and development officer

## 2021-02-08 ENCOUNTER — Other Ambulatory Visit: Payer: Self-pay

## 2021-02-08 ENCOUNTER — Encounter (HOSPITAL_COMMUNITY)
Admission: RE | Admit: 2021-02-08 | Discharge: 2021-02-08 | Disposition: A | Discharge status: Home / Self Care | Payer: No Typology Code available for payment source | Source: Ambulatory Visit | Attending: Pulmonary Disease | Admitting: Pulmonary Disease

## 2021-02-08 DIAGNOSIS — J449 Chronic obstructive pulmonary disease, unspecified: Secondary | ICD-10-CM

## 2021-02-08 NOTE — Progress Notes (Signed)
Daily Session Note  Patient Details  Name: MEIKO STRANAHAN MRN: [id number] Date of Birth: June 09, 1960 Referring Provider:   April Manson Pulmonary Rehab Walk Test from 02/04/2021 in [place]  Referring Provider [name]       Encounter Date: 02/08/2021  Check In:  Session Check In - 02/08/21 1125       Check-In   Supervising physician immediately available to respond to emergencies [facility] immediately available    Physician(s) Dr. Maren Beach    Location [facility]    Staff Present Rodney Langton, Cathleen Fears, MS, ACSM-CEP, Exercise Physiologist;David Lilyan Punt, MS, ACSM-CEP, CCRP, Exercise Physiologist    Virtual Visit No    Medication changes reported     No    Fall or balance concerns reported    No    Tobacco Cessation No Change    Warm-up and Cool-down Performed as group-led instruction    Resistance Training Performed Yes    VAD Patient? No    PAD/SET Patient? No      Pain Assessment   Currently in Pain? No/denies    Multiple Pain Sites No             Capillary Blood Glucose: No results found for this or any previous visit (from the past 24 hour(s)).    Social History   Tobacco Use  Smoking Status Former   Packs/day: 2.00   Years: 30.00   Pack years: 60.00   Types: Cigarettes   Quit date: 04/08/2013   Years since quitting: 7.8  Smokeless Tobacco Never    Goals Met:  Exercise tolerated well No report of concerns or symptoms today Strength training completed today  Goals Unmet:  Not Applicable  Comments: Service time is from 1020 to 1135. Completed 1st day of exercise.    Dr. Rodman Pickle is Medical Director for Pulmonary Rehab at [facility].

## 2021-02-10 ENCOUNTER — Encounter (HOSPITAL_COMMUNITY)
Admission: RE | Admit: 2021-02-10 | Discharge: 2021-02-10 | Disposition: A | Discharge status: Home / Self Care | Payer: No Typology Code available for payment source | Source: Ambulatory Visit | Attending: Pulmonary Disease | Admitting: Pulmonary Disease

## 2021-02-10 ENCOUNTER — Other Ambulatory Visit: Payer: Self-pay

## 2021-02-10 DIAGNOSIS — J449 Chronic obstructive pulmonary disease, unspecified: Secondary | ICD-10-CM | POA: Diagnosis not present

## 2021-02-10 NOTE — Progress Notes (Signed)
Daily Session Note  Patient Details  Name: Vincent Sutton MRN: [id number] Date of Birth: 1961/02/15 Referring Provider:   April Sutton Pulmonary Rehab Walk Test from 02/04/2021 in [place]  Referring Provider Vincent Sutton       Encounter Date: 02/10/2021  Check In:  Session Check In - 02/10/21 1122       Check-In   Supervising physician immediately available to respond to emergencies [facility] immediately available    Physician(s) Vincent Sutton    Location [facility]    Staff Present Vincent Poles, RN, BSN;Vincent Wilber Oliphant, RN, Vincent Ore, MS, ACSM-CEP, Exercise Physiologist;Vincent Sutton Vincent Evert, RN    Virtual Visit No    Medication changes reported     No    Fall or balance concerns reported    No    Tobacco Cessation No Change    Warm-up and Cool-down Performed as group-led instruction    Resistance Training Performed Yes    VAD Patient? No      Pain Assessment   Currently in Pain? No/denies    Multiple Pain Sites No             Capillary Blood Glucose: No results found for this or any previous visit (from the past 24 hour(s)).    Social History   Tobacco Use  Smoking Status Former   Packs/day: 2.00   Years: 30.00   Pack years: 60.00   Types: Cigarettes   Quit date: 04/08/2013   Years since quitting: 7.8  Smokeless Tobacco Never    Goals Met:  Exercise tolerated well No report of concerns or symptoms today Strength training completed today  Goals Unmet:  Not Applicable  Comments: Service time is from 1021 to [place]    Vincent Sutton is Medical Director for Pulmonary Rehab at [facility].

## 2021-02-15 ENCOUNTER — Encounter (HOSPITAL_COMMUNITY)
Admission: RE | Admit: 2021-02-15 | Discharge: 2021-02-15 | Disposition: A | Discharge status: Home / Self Care | Payer: No Typology Code available for payment source | Source: Ambulatory Visit | Attending: Pulmonary Disease | Admitting: Pulmonary Disease

## 2021-02-15 ENCOUNTER — Other Ambulatory Visit: Payer: Self-pay

## 2021-02-15 VITALS — Wt 229.5 lb

## 2021-02-15 DIAGNOSIS — J449 Chronic obstructive pulmonary disease, unspecified: Secondary | ICD-10-CM | POA: Diagnosis not present

## 2021-02-15 NOTE — Progress Notes (Signed)
Daily Session Note  Patient Details  Name: Vincent Sutton MRN: [id number] Date of Birth: March 15, 1960 Referring Provider:   April Manson Pulmonary Rehab Walk Test from 02/04/2021 in [place]  Referring Provider [name]       Encounter Date: 02/15/2021  Check In:  Session Check In - 02/15/21 1040       Check-In   Supervising physician immediately available to respond to emergencies [facility] immediately available    Physician(s) Dr. [place] Pink    Location [facility]    Staff Present Rosebud Poles, RN, Quentin Ore, MS, ACSM-CEP, Exercise Physiologist;Lisa Ysidro Evert, RN    Virtual Visit No    Medication changes reported     No    Fall or balance concerns reported    No    Tobacco Cessation No Change    Warm-up and Cool-down Performed as group-led instruction    Resistance Training Performed Yes    VAD Patient? No    PAD/SET Patient? No      Pain Assessment   Currently in Pain? Yes    Pain Score 4     Pain Location Knee    Pain Orientation Left;Right    Pain Descriptors / Indicators Aching    Pain Type Chronic pain    Pain Onset More than a month ago    Pain Frequency Intermittent    Multiple Pain Sites No             Capillary Blood Glucose: No results found for this or any previous visit (from the past 24 hour(s)).   Exercise Prescription Changes - 02/15/21 1200       Response to Exercise   Blood Pressure (Admit) 116/64    Blood Pressure (Exercise) 140/72    Blood Pressure (Exit) 110/70    Heart Rate (Admit) 100 bpm    Heart Rate (Exercise) 112 bpm    Heart Rate (Exit) 102 bpm    Oxygen Saturation (Admit) 98 %    Oxygen Saturation (Exercise) 96 %    Oxygen Saturation (Exit) 99 %    Rating of Perceived Exertion (Exercise) 11    Perceived Dyspnea (Exercise) 1    Duration Continue with 30 min of aerobic exercise without signs/symptoms of physical distress.    Intensity THRR unchanged       Progression   Progression Continue to progress workloads to maintain intensity without signs/symptoms of physical distress.      Resistance Training   Training Prescription Yes    Weight blue bands    Reps 10-15    Time 10 Minutes      Oxygen   Oxygen Continuous    Liters 3      NuStep   Level 5    SPM 100    Minutes 15    METs 2.1      Track   Laps 15    Minutes 15    METs 2.74      Oxygen   Maintain Oxygen Saturation 88% or higher             Social History   Tobacco Use  Smoking Status Former   Packs/day: 2.00   Years: 30.00   Pack years: 60.00   Types: Cigarettes   Quit date: 04/08/2013   Years since quitting: 7.8  Smokeless Tobacco Never    Goals Met:  Proper associated with RPD/PD & O2 Sat Independence with exercise equipment Exercise tolerated well No report of concerns  or symptoms today Strength training completed today  Goals Unmet:  Not Applicable  Comments: Service time is from 1022 to 1145.    Dr. Rodman Pickle is Medical Director for Pulmonary Rehab at [facility].

## 2021-02-17 ENCOUNTER — Telehealth (HOSPITAL_COMMUNITY): Payer: Self-pay

## 2021-02-17 ENCOUNTER — Encounter (HOSPITAL_COMMUNITY): Admission: RE | Admit: 2021-02-17 | Payer: No Typology Code available for payment source | Source: Ambulatory Visit

## 2021-02-22 ENCOUNTER — Other Ambulatory Visit: Payer: Self-pay

## 2021-02-22 ENCOUNTER — Encounter (HOSPITAL_COMMUNITY)
Admission: RE | Admit: 2021-02-22 | Discharge: 2021-02-22 | Disposition: A | Discharge status: Home / Self Care | Payer: No Typology Code available for payment source | Source: Ambulatory Visit | Attending: Pulmonary Disease | Admitting: Pulmonary Disease

## 2021-02-22 DIAGNOSIS — J449 Chronic obstructive pulmonary disease, unspecified: Secondary | ICD-10-CM | POA: Diagnosis not present

## 2021-02-22 DIAGNOSIS — J9611 Chronic respiratory failure with hypoxia: Secondary | ICD-10-CM | POA: Insufficient documentation

## 2021-02-22 NOTE — Progress Notes (Signed)
Daily Session Note  Patient Details  Name: Vincent Sutton MRN: [id number] Date of Birth: 1960-12-30 Referring Provider:   April Manson Pulmonary Rehab Walk Test from 02/04/2021 in [place]  Referring Provider [name]       Encounter Date: 02/22/2021  Check In:   Capillary Blood Glucose: No results found for this or any previous visit (from the past 24 hour(s)).    Social History   Tobacco Use  Smoking Status Former   Packs/day: 2.00   Years: 30.00   Pack years: 60.00   Types: Cigarettes   Quit date: 04/08/2013   Years since quitting: 7.8  Smokeless Tobacco Never    Goals Met:  Exercise tolerated well No report of concerns or symptoms today Strength training completed today  Goals Unmet:  Not Applicable.   Comments: Service time is from 1022 to 1140    Dr. Rodman Pickle is Medical Director for Pulmonary Rehab at [facility].

## 2021-02-23 NOTE — Progress Notes (Signed)
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    Trouble concentrating 0 1 1 3    Moving slowly or fidgety/restless  0 1 1 1    Suicidal thoughts 0 0 0 1   PHQ-9 Score 8 11 9 13    Difficult doing work/chores Somewhat difficult Somewhat difficult Somewhat difficult Very difficult      Interpretation of Total Score  Total Score Depression Severity:  1-4 = Minimal depression, 5-9 = Mild depression, 10-14 = Moderate depression, 15-19 = Moderately severe depression, 20-27 = Severe depression   Psychosocial Evaluation and Intervention:  Psychosocial Evaluation - 02/04/21 1038       Psychosocial Evaluation & Interventions   Interventions Encouraged to exercise with the program and follow exercise prescription    Comments Pt practices meditation.    Continue  Psychosocial Services  No Follow up required             Psychosocial Re-Evaluation:  Psychosocial Re-Evaluation     [place] Name 02/23/21 [phone]             Psychosocial Re-Evaluation   Current issues with Current Sleep Concerns       Comments Satvik states that he is still having difficulty sleeping at this point he feels that it is related to his feet and heel pain.He reported that he had a sleep study 11/22 and that he was told that he no longer has sleep apnea. He no longer has his CPAP. He is using his oxygen 3 liters with sleep.He called out from PR due to foot pain 02/17/21 stated that he was unable to walk due to this. He called his PCP at the [place] and was given some pain medication. The PCP told him that is was nerve pain. He states that he has had gout in the past and he feels like that is his problem. He reports that the pain is slightly improved but that it is still uncomfortable.He was going to reach out to his PCP again. He has attended 4 exercise sessipns thus far and he is doing well. He is enjoying the PR program and he is increasing his workloads and METS.I expect if the pain in his feet improve he will be able to perform with his workouts.       Expected Outcomes For Jeremiyah to be able to continue to participate in the PR program without psychosocial needs or concerns.That his pain in his feet will improve and that his sleep pattern will follow.       Interventions Encouraged to attend Pulmonary Rehabilitation for the exercise       Continue Psychosocial Services  Follow up required by staff                Psychosocial Discharge (Final Psychosocial Re-Evaluation):  Psychosocial Re-Evaluation - 02/23/21 0959       Psychosocial Re-Evaluation   Current issues with Current Sleep Concerns    Comments Shelvy states that he is still having difficulty sleeping at this point he feels that it is related to his feet and heel pain.He reported that he had a sleep study 11/22 and  that he was told that he no longer has sleep apnea. He no longer has his CPAP. He is using his oxygen 3 liters with sleep.He called out from PR due to foot pain 02/17/21 stated that he was unable to walk due to this. He called his PCP at the [place] and was given some pain medication. The PCP told him that is was nerve pain. He states that he has had gout in the past and he feels like that is his problem. He reports that  the pain is slightly improved but that it is still uncomfortable.He was going to reach out to his PCP again. He has attended 4 exercise sessipns thus far and he is doing well. He is enjoying the PR program and he is increasing his workloads and METS.I expect if the pain in his feet improve he will be able to perform with his workouts.    Expected Outcomes For [place] to be able to continue to participate in the PR program without psychosocial needs or concerns.That his pain in his feet will improve and that his sleep pattern will follow.    Interventions Encouraged to attend Pulmonary Rehabilitation for the exercise    Continue Psychosocial Services  Follow up required by staff             Education: Education Goals: Education classes will be provided on a weekly basis, covering required topics. Participant will state understanding/return demonstration of topics presented.  Learning Barriers/Preferences:   Education Topics: Risk Factor Reduction:  -Group instruction that is supported by a PowerPoint presentation. Instructor discusses the definition of a risk factor, different risk factors for pulmonary disease, and how the heart and lungs work together.     Nutrition for Pulmonary Patient:  -Group instruction provided by PowerPoint slides, verbal discussion, and written materials to support subject matter. The instructor gives an explanation and review of healthy diet recommendations, which includes a discussion on weight management, recommendations for fruit and vegetable  consumption, as well as protein, fluid, caffeine, fiber, sodium, sugar, and alcohol. Tips for eating when patients are short of breath are discussed. Flowsheet Row PULMONARY REHAB OTHER RESPIRATORY from 04/25/2018 in [place]  Date 02/28/18  Educator Rodman Pickle  Instruction Review Code 1- Verbalizes Understanding       Pursed Lip Breathing:  -Group instruction that is supported by demonstration and informational handouts. Instructor discusses the benefits of pursed lip and diaphragmatic breathing and detailed demonstration on how to preform both.     Oxygen Safety:  -Group instruction provided by PowerPoint, verbal discussion, and written material to support subject matter. There is an overview of What is Oxygen and Why do we need it.  Instructor also reviews how to create a safe environment for oxygen use, the importance of using oxygen as prescribed, and the risks of noncompliance. There is a brief discussion on traveling with oxygen and resources the patient may utilize. Flowsheet Row PULMONARY REHAB OTHER RESPIRATORY from 04/25/2018 in [place]  Date 02/07/18  Educator Remo Lipps  Instruction Review Code 1- Verbalizes Understanding       Oxygen Equipment:  -Group instruction provided by [employer] utilizing handouts, written materials, and Insurance underwriter. Flowsheet Row PULMONARY REHAB OTHER RESPIRATORY from 04/25/2018 in [place]  Date 11/29/17  Educator Ace Gins  Instruction Review Code 1- Verbalizes Understanding       Signs and Symptoms:  -Group instruction provided by written material and verbal discussion to support subject matter. Warning signs and symptoms of infection, stroke, and heart attack are reviewed and when to call the physician/911 reinforced. Tips for preventing the spread of infection discussed. Flowsheet Row PULMONARY REHAB OTHER RESPIRATORY from  04/25/2018 in [place]  Date 04/18/18  Educator Remo Lipps  Instruction Review Code 1- Verbalizes Understanding       Advanced Directives:  -Group instruction provided by verbal instruction and written material to support subject matter. Instructor reviews Chartered loss adjuster and  proper instruction for filling out document.   Pulmonary Video:  -Group video education that reviews the importance of medication and oxygen compliance, exercise, good nutrition, pulmonary hygiene, and pursed lip and diaphragmatic breathing for the pulmonary patient.   Exercise for the Pulmonary Patient:  -Group instruction that is supported by a PowerPoint presentation. Instructor discusses benefits of exercise, core components of exercise, frequency, duration, and intensity of an exercise routine, importance of utilizing pulse oximetry during exercise, safety while exercising, and options of places to exercise outside of rehab.   Flowsheet Row PULMONARY REHAB OTHER RESPIRATORY from 04/25/2018 in [place]  Date 04/11/18  Educator Kirk Ruths  Instruction Review Code 1- Verbalizes Understanding       Pulmonary Medications:  -Verbally interactive group education provided by instructor with focus on inhaled medications and proper administration. Flowsheet Row PULMONARY REHAB OTHER RESPIRATORY from 04/25/2018 in [place]  Date 03/12/18  Educator Pharmacist  Instruction Review Code 1- Verbalizes Understanding       Anatomy and Physiology of the Respiratory System and Intimacy:  -Group instruction provided by PowerPoint, verbal discussion, and written material to support subject matter. Instructor reviews respiratory cycle and anatomical components of the respiratory system and their functions. Instructor also reviews differences in obstructive and restrictive respiratory diseases with examples of each. Intimacy, Sex, and  Sexuality differences are reviewed with a discussion on how relationships can change when diagnosed with pulmonary disease. Common sexual concerns are reviewed. Flowsheet Row PULMONARY REHAB OTHER RESPIRATORY from 04/25/2018 in [place]  Date 03/21/18  Educator rn  Instruction Review Code 2- Demonstrated Understanding       MD DAY -A group question and answer session with a medical doctor that allows participants to ask questions that relate to their pulmonary disease state.   OTHER EDUCATION -Group or individual verbal, written, or video instructions that support the educational goals of the pulmonary rehab program. [place] from 02/15/2021 in [place]  Date 02/15/21  Educator Deanne Coffer your numbers]  Instruction Review Code 1- Verbalizes Understanding       Holiday Eating Survival Tips:  -Group instruction provided by PowerPoint slides, verbal discussion, and written materials to support subject matter. The instructor gives patients tips, tricks, and techniques to help them not only survive but enjoy the holidays despite the onslaught of food that accompanies the holidays.   Knowledge Questionnaire Score:  Knowledge Questionnaire Score - 02/04/21 1042       Knowledge Questionnaire Score   Pre Score 16/18             Core Components/Risk Factors/Patient Goals at Admission:  Personal Goals and Risk Factors at Admission - 02/04/21 1148       Core Components/Risk Factors/Patient Goals on Admission   Improve shortness of breath with ADL's Yes    Intervention Provide education, individualized exercise plan and daily activity instruction to help decrease symptoms of SOB with activities of daily living.    Expected Outcomes Short Term: Improve cardiorespiratory fitness to achieve a reduction of symptoms when performing ADLs;Long Term: Be able to  perform more ADLs without symptoms or delay the onset of symptoms             Core Components/Risk Factors/Patient Goals Review:   Goals and Risk Factor Review     Row Name 02/23/21 1018  Core Components/Risk Factors/Patient Goals Review   Personal Goals Review Improve shortness of breath with ADL's;Develop more efficient breathing techniques such as purse lipped breathing and diaphragmatic breathing and practicing self-pacing with activity.;Increase knowledge of respiratory medications and ability to use respiratory devices properly.       Review Makarios is performing well even with the discomfot that he has been expercing in his feet. He is walking the track and is on the nu step. He has been increasing his workloads and METS levels.  He is requiring 3 liters of oxygen with exercise.       Expected Outcomes see admission goals. That Shoua will be able to continue to perform exercise with increasing workloads and METS levels.                Core Components/Risk Factors/Patient Goals at Discharge (Final Review):   Goals and Risk Factor Review - 02/23/21 1018       Core Components/Risk Factors/Patient Goals Review   Personal Goals Review Improve shortness of breath with ADL's;Develop more efficient breathing techniques such as purse lipped breathing and diaphragmatic breathing and practicing self-pacing with activity.;Increase knowledge of respiratory medications and ability to use respiratory devices properly.    Review Rock is performing well even with the discomfot that he has been expercing in his feet. He is walking the track and is on the nu step. He has been increasing his workloads and METS levels.  He is requiring 3 liters of oxygen with exercise.    Expected Outcomes see admission goals. That Kyen will be able to continue to perform exercise with increasing workloads and METS levels.             ITP Comments:   Comments: ITP REVIEW Pt is making  expected progress toward Pulmonary Rehab goals after completing 4 sessions. Recommend continued exercise, life style modification, education, and utilization of breathing techniques to increase stamina and strength, while also decreasing shortness of breath with exertion.   Dr. Rodman Pickle is Medical Director for Pulmonary Rehab at [facility].

## 2021-02-24 ENCOUNTER — Encounter (HOSPITAL_COMMUNITY): Payer: No Typology Code available for payment source

## 2021-03-01 ENCOUNTER — Other Ambulatory Visit: Payer: Self-pay

## 2021-03-01 ENCOUNTER — Encounter (HOSPITAL_COMMUNITY)
Admission: RE | Admit: 2021-03-01 | Discharge: 2021-03-01 | Disposition: A | Discharge status: Home / Self Care | Payer: No Typology Code available for payment source | Source: Ambulatory Visit | Attending: Pulmonary Disease | Admitting: Pulmonary Disease

## 2021-03-01 VITALS — Wt 227.5 lb

## 2021-03-01 DIAGNOSIS — J449 Chronic obstructive pulmonary disease, unspecified: Secondary | ICD-10-CM

## 2021-03-01 NOTE — Progress Notes (Signed)
Home Exercise Prescription I have reviewed a Home Exercise Prescription with Vincent Sutton. Vincent Sutton is currently exercising at home. Vincent Sutton and Vincent Sutton walk 3 non-rehab days/wk for 30 min/day. They have land to walk around and home exercise equipment if he weather is not suitable. Vincent Sutton has completed Pulmonary Rehab before as he knows how to improve Vincent functional capacity. He stated that he has lost weight since doing the program last time. Vincent also mentioned that Vincent Sutton had bariatric surgery. Both are trying to lose weight through a healthy diet and exercise. Vincent Sutton is motivated to exercise and has the resources. I am confident in Vincent Sutton carrying out an exercise regimen at home. Vincent Sutton stated that they understand the exercise prescription. The patient stated that their goals were to lose weight and maintain current functional capacity. We reviewed exercise guidelines, target heart rate during exercise, RPE Scale, weather conditions, endpoints for exercise, warmup and cool down. The patient is encouraged to come to me with any questions. I will continue to follow up with the patient to assist them with progression and safety.    Sheppard Plumber, MS, ACSM-CEP 03/01/2021 11:56 AM

## 2021-03-01 NOTE — Progress Notes (Signed)
Daily Session Note  Patient Details  Name: Vincent Sutton MRN: [id number] Date of Birth: 06-18-1960 Referring Provider:   April Manson Pulmonary Rehab Walk Test from 02/04/2021 in [place]  Referring Provider [name]       Encounter Date: 03/01/2021  Check In:  Session Check In - 03/01/21 1034       Check-In   Supervising physician immediately available to respond to emergencies [facility] immediately available    Physician(s) Dr. Cyndia Skeeters    Location [facility]    Staff Present Rosebud Poles, RN, Quentin Ore, MS, ACSM-CEP, Exercise Physiologist;Lisa Ysidro Evert, RN    Virtual Visit No    Medication changes reported     No    Fall or balance concerns reported    No    Tobacco Cessation No Change    Warm-up and Cool-down Performed as group-led instruction    Resistance Training Performed Yes    VAD Patient? No    PAD/SET Patient? No      Pain Assessment   Currently in Pain? No/denies    Multiple Pain Sites No             Capillary Blood Glucose: No results found for this or any previous visit (from the past 24 hour(s)).   Exercise Prescription Changes - 03/01/21 1100       Response to Exercise   Blood Pressure (Admit) 106/62    Blood Pressure (Exercise) 122/60    Blood Pressure (Exit) 120/76    Heart Rate (Admit) 91 bpm    Heart Rate (Exercise) 115 bpm    Heart Rate (Exit) 94 bpm    Oxygen Saturation (Admit) 99 %    Oxygen Saturation (Exercise) 95 %    Oxygen Saturation (Exit) 98 %    Rating of Perceived Exertion (Exercise) 11    Perceived Dyspnea (Exercise) 2    Duration Continue with 30 min of aerobic exercise without signs/symptoms of physical distress.    Intensity THRR unchanged      Progression   Progression Continue to progress workloads to maintain intensity without signs/symptoms of physical distress.      Resistance Training   Training Prescription Yes    Weight blue bands    Reps  10-15    Time 10 Minutes      Oxygen   Oxygen Continuous    Liters 3      NuStep   Level 5    SPM 100    Minutes 15    METs 3.7      Track   Laps 16    Minutes 15    METs 2.86      Home Exercise Plan   Plans to continue exercise at Home (comment)   Has home equipment and land   Frequency Add 1 additional day to program exercise sessions.   Meet minimum requirment   Initial Home Exercises Provided 03/01/21      Oxygen   Maintain Oxygen Saturation 88% or higher             Social History   Tobacco Use  Smoking Status Former   Packs/day: 2.00   Years: 30.00   Pack years: 60.00   Types: Cigarettes   Quit date: 04/08/2013   Years since quitting: 7.9  Smokeless Tobacco Never    Goals Met:  Proper associated with RPD/PD & O2 Sat Independence with exercise equipment Exercise tolerated well No report of concerns or symptoms today  Strength training completed today  Goals Unmet:  Not Applicable  Comments: Service time is from 1022 to 1135.    Dr. Rodman Pickle is Medical Director for Pulmonary Rehab at [facility].

## 2021-03-03 ENCOUNTER — Encounter (HOSPITAL_COMMUNITY)
Admission: RE | Admit: 2021-03-03 | Discharge: 2021-03-03 | Disposition: A | Discharge status: Home / Self Care | Payer: No Typology Code available for payment source | Source: Ambulatory Visit | Attending: Pulmonary Disease | Admitting: Pulmonary Disease

## 2021-03-03 ENCOUNTER — Other Ambulatory Visit: Payer: Self-pay

## 2021-03-03 DIAGNOSIS — J449 Chronic obstructive pulmonary disease, unspecified: Secondary | ICD-10-CM

## 2021-03-03 NOTE — Progress Notes (Signed)
Daily Session Note  Patient Details  Name: Vincent Sutton MRN: [id number] Date of Birth: 1960-06-30 Referring Provider:   April Manson Pulmonary Rehab Walk Test from 02/04/2021 in [place]  Referring Provider [name]       Encounter Date: 03/03/2021  Check In:  Session Check In - 03/03/21 1124       Check-In   Supervising physician immediately available to respond to emergencies [facility] immediately available    Physician(s) Dr. Starla Link    Location [facility]    Staff Present Rodney Langton, Cathleen Fears, MS, ACSM-CEP, Exercise Physiologist    Virtual Visit No    Medication changes reported     No    Fall or balance concerns reported    No    Tobacco Cessation No Change    Warm-up and Cool-down Performed as group-led instruction    Resistance Training Performed Yes    VAD Patient? No    PAD/SET Patient? No      Pain Assessment   Currently in Pain? No/denies    Multiple Pain Sites No             Capillary Blood Glucose: No results found for this or any previous visit (from the past 24 hour(s)).    Social History   Tobacco Use  Smoking Status Former   Packs/day: 2.00   Years: 30.00   Pack years: 60.00   Types: Cigarettes   Quit date: 04/08/2013   Years since quitting: 7.9  Smokeless Tobacco Never    Goals Met:  Independence with exercise equipment Exercise tolerated well No report of concerns or symptoms today Strength training completed today  Goals Unmet:  Not Applicable  Comments: Service time is from 1022 to 1140.    Dr. Rodman Pickle is Medical Director for Pulmonary Rehab at [facility].

## 2021-03-08 ENCOUNTER — Encounter (HOSPITAL_COMMUNITY)
Admission: RE | Admit: 2021-03-08 | Discharge: 2021-03-08 | Disposition: A | Discharge status: Home / Self Care | Payer: No Typology Code available for payment source | Source: Ambulatory Visit | Attending: Pulmonary Disease | Admitting: Pulmonary Disease

## 2021-03-08 ENCOUNTER — Other Ambulatory Visit: Payer: Self-pay

## 2021-03-08 DIAGNOSIS — J449 Chronic obstructive pulmonary disease, unspecified: Secondary | ICD-10-CM | POA: Diagnosis not present

## 2021-03-08 NOTE — Progress Notes (Signed)
Daily Session Note  Patient Details  Name: Vincent Sutton MRN: [id number] Date of Birth: 1960-05-07 Referring Provider:   April Manson Pulmonary Rehab Walk Test from 02/04/2021 in [place]  Referring Provider [name]       Encounter Date: 03/08/2021  Check In:  Session Check In - 03/08/21 1133       Check-In   Supervising physician immediately available to respond to emergencies [facility] immediately available    Physician(s) Dr. Starla Link    [facility]    Staff Present Rodney Langton, RN;Carlette Wilber Oliphant, RN, Quentin Ore, MS, ACSM-CEP, Exercise Physiologist    Virtual Visit No    Medication changes reported     No    Fall or balance concerns reported    No    Tobacco Cessation No Change    Warm-up and Cool-down Performed as group-led instruction    Resistance Training Performed Yes    VAD Patient? No    PAD/SET Patient? No      Pain Assessment   Currently in Pain? No/denies    Multiple Pain Sites No             Capillary Blood Glucose: No results found for this or any previous visit (from the past 24 hour(s)).    Social History   Tobacco Use  Smoking Status Former   Packs/day: 2.00   Years: 30.00   Pack years: 60.00   Types: Cigarettes   Quit date: 04/08/2013   Years since quitting: 7.9  Smokeless Tobacco Never    Goals Met:  Proper associated with RPD/PD & O2 Sat Independence with exercise equipment Exercise tolerated well No report of concerns or symptoms today Strength training completed today  Goals Unmet:  Not Applicable  Comments: Service time is from 1028 to 1140.    Dr. Rodman Pickle is Medical Director for Pulmonary Rehab at [facility].

## 2021-03-10 ENCOUNTER — Other Ambulatory Visit: Payer: Self-pay

## 2021-03-10 ENCOUNTER — Encounter (HOSPITAL_COMMUNITY)
Admission: RE | Admit: 2021-03-10 | Discharge: 2021-03-10 | Disposition: A | Discharge status: Home / Self Care | Payer: No Typology Code available for payment source | Source: Ambulatory Visit | Attending: Pulmonary Disease | Admitting: Pulmonary Disease

## 2021-03-10 DIAGNOSIS — J449 Chronic obstructive pulmonary disease, unspecified: Secondary | ICD-10-CM

## 2021-03-10 NOTE — Progress Notes (Signed)
Daily Session Note  Patient Details  Name: Vincent Sutton MRN: [id number] Date of Birth: 1960-02-22 Referring Provider:   April Manson Pulmonary Rehab Walk Test from 02/04/2021 in [place]  Referring Provider [name]       Encounter Date: 03/10/2021  Check In:  Session Check In - 03/10/21 1151       Check-In   Supervising physician immediately available to respond to emergencies [facility] immediately available    Physician(s) Dr. Kurtis Bushman    [facility]    Staff Present Ramon Dredge, RN, MHA;Francessca Friis Ysidro Evert, RN;Carlette Wilber Oliphant, RN, BSN    Virtual Visit No    Medication changes reported     No    Fall or balance concerns reported    No    Tobacco Cessation No Change    Warm-up and Cool-down Performed as group-led Higher education careers adviser Performed Yes    VAD Patient? No    PAD/SET Patient? No      Pain Assessment   Currently in Pain? No/denies    Multiple Pain Sites No             Capillary Blood Glucose: No results found for this or any previous visit (from the past 24 hour(s)).    Social History   Tobacco Use  Smoking Status Former   Packs/day: 2.00   Years: 30.00   Pack years: 60.00   Types: Cigarettes   Quit date: 04/08/2013   Years since quitting: 7.9  Smokeless Tobacco Never    Goals Met:  Exercise tolerated well No report of concerns or symptoms today Strength training completed today  Goals Unmet:  Not Applicable  Comments: Service time is from 1025 to [place]    Dr. Rodman Pickle is Medical Director for Pulmonary Rehab at [facility].

## 2021-03-15 ENCOUNTER — Other Ambulatory Visit: Payer: Self-pay

## 2021-03-15 ENCOUNTER — Encounter (HOSPITAL_COMMUNITY)
Admission: RE | Admit: 2021-03-15 | Discharge: 2021-03-15 | Disposition: A | Discharge status: Home / Self Care | Payer: No Typology Code available for payment source | Source: Ambulatory Visit | Attending: Pulmonary Disease | Admitting: Pulmonary Disease

## 2021-03-15 VITALS — Wt 233.9 lb

## 2021-03-15 DIAGNOSIS — J449 Chronic obstructive pulmonary disease, unspecified: Secondary | ICD-10-CM

## 2021-03-15 NOTE — Progress Notes (Signed)
Daily Session Note  Patient Details  Name: Vincent Sutton MRN: [id number] Date of Birth: [birth year] Referring Provider:   April Manson Pulmonary Rehab Walk Test from 02/04/2021 in [place]  Referring Provider [name]       Encounter Date: 03/15/2021  Check In:  Session Check In - 03/15/21 1207       Check-In   Supervising physician immediately available to respond to emergencies [facility] immediately available    Physician(s) Dr.Amery    Location [facility]    Staff Present Maurice Small, RN, Quentin Ore, MS, ACSM-CEP, Exercise Physiologist;Lisa Ysidro Evert, RN;Olinty Celesta Aver, MS, ACSM CEP, Exercise Physiologist    Virtual Visit No    Medication changes reported     No    Fall or balance concerns reported    No    Tobacco Cessation No Change    Warm-up and Cool-down Performed as group-led instruction    Resistance Training Performed Yes    VAD Patient? No    PAD/SET Patient? No      Pain Assessment   Currently in Pain? No/denies    Multiple Pain Sites No             Capillary Blood Glucose: No results found for this or any previous visit (from the past 24 hour(s)).   Exercise Prescription Changes - 03/15/21 1500       Response to Exercise   Blood Pressure (Admit) 120/72    Blood Pressure (Exercise) 132/72    Blood Pressure (Exit) 104/60    Heart Rate (Admit) 81 bpm    Heart Rate (Exercise) 95 bpm    Heart Rate (Exit) 85 bpm    Oxygen Saturation (Admit) 100 %    Oxygen Saturation (Exercise) 97 %    Oxygen Saturation (Exit) 97 %    Rating of Perceived Exertion (Exercise) 11    Perceived Dyspnea (Exercise) 1    Duration Continue with 30 min of aerobic exercise without signs/symptoms of physical distress.    Intensity THRR unchanged      Progression   Progression Continue to progress workloads to maintain intensity without signs/symptoms of physical distress.      Resistance Training    Training Prescription Yes    Weight blue bands    Reps 10-15    Time 10 Minutes      Oxygen   Oxygen Continuous    Liters 3      NuStep   Level 5    SPM 80    Minutes 15    METs 2.3      Track   Laps 16    Minutes 15    METs 2.96      Oxygen   Maintain Oxygen Saturation 88% or higher             Social History   Tobacco Use  Smoking Status Former   Packs/day: 2.00   Years: 30.00   Pack years: 60.00   Types: Cigarettes   Quit date: 04/08/2013   Years since quitting: 7.9  Smokeless Tobacco Never    Goals Met:  Proper associated with RPD/PD & O2 Sat Independence with exercise equipment Exercise tolerated well No report of concerns or symptoms today Strength training completed today  Goals Unmet:  Not Applicable  Comments: Service time is from 1031 to 1145.    Dr. Rodman Pickle is Medical Director for Pulmonary Rehab at [facility].

## 2021-03-17 ENCOUNTER — Other Ambulatory Visit: Payer: Self-pay

## 2021-03-17 ENCOUNTER — Encounter (HOSPITAL_COMMUNITY)
Admission: RE | Admit: 2021-03-17 | Discharge: 2021-03-17 | Disposition: A | Discharge status: Home / Self Care | Payer: No Typology Code available for payment source | Source: Ambulatory Visit | Attending: Pulmonary Disease | Admitting: Pulmonary Disease

## 2021-03-17 DIAGNOSIS — J449 Chronic obstructive pulmonary disease, unspecified: Secondary | ICD-10-CM

## 2021-03-17 NOTE — Progress Notes (Signed)
Daily Session Note  Patient Details  Name: Vincent Sutton MRN: [id number] Date of Birth: 08/18/1960 Referring Provider:   April Manson Pulmonary Rehab Walk Test from 02/04/2021 in [place]  Referring Provider [name]       Encounter Date: 03/17/2021  Check In:  Session Check In - 03/17/21 1204       Check-In   Supervising physician immediately available to respond to emergencies [facility] immediately available    Physician(s) Dr. Avon Gully    Location [facility]    Staff Present Rodney Langton, RN;Jetta Gilford Rile BS, ACSM EP-C, Exercise Physiologist;Sharri Loya Rosana Hoes, MS, ACSM-CEP, Exercise Physiologist    Virtual Visit No    Medication changes reported     No    Fall or balance concerns reported    No    Tobacco Cessation No Change    Warm-up and Cool-down Performed as group-led instruction    Resistance Training Performed Yes    VAD Patient? No    PAD/SET Patient? No      Pain Assessment   Currently in Pain? No/denies    Multiple Pain Sites No             Capillary Blood Glucose: No results found for this or any previous visit (from the past 24 hour(s)).    Social History   Tobacco Use  Smoking Status Former   Packs/day: 2.00   Years: 30.00   Pack years: 60.00   Types: Cigarettes   Quit date: 04/08/2013   Years since quitting: 7.9  Smokeless Tobacco Never    Goals Met:  Proper associated with RPD/PD & O2 Sat Independence with exercise equipment Exercise tolerated well No report of concerns or symptoms today Strength training completed today  Goals Unmet:  Not Applicable  Comments: Service time is from 1024 to 1142.    Dr. Rodman Pickle is Medical Director for Pulmonary Rehab at [facility].

## 2021-03-22 ENCOUNTER — Other Ambulatory Visit: Payer: Self-pay

## 2021-03-22 ENCOUNTER — Encounter (HOSPITAL_COMMUNITY)
Admission: RE | Admit: 2021-03-22 | Discharge: 2021-03-22 | Disposition: A | Discharge status: Home / Self Care | Payer: No Typology Code available for payment source | Source: Ambulatory Visit | Attending: Pulmonary Disease | Admitting: Pulmonary Disease

## 2021-03-22 DIAGNOSIS — J449 Chronic obstructive pulmonary disease, unspecified: Secondary | ICD-10-CM | POA: Diagnosis not present

## 2021-03-22 NOTE — Progress Notes (Signed)
Daily Session Note  Patient Details  Name: Vincent Sutton MRN: [id number] Date of Birth: [birth year] Referring Provider:   April Manson Pulmonary Rehab Walk Test from 02/04/2021 in [place]  Referring Provider [name]       Encounter Date: 03/22/2021  Check In:  Session Check In - 03/22/21 1118       Check-In   Supervising physician immediately available to respond to emergencies [facility] immediately available    Physician(s) Dr. Avon Gully    Location [facility]    Staff Present Rosebud Poles, RN, BSN;Carlette Wilber Oliphant, RN, BSN;Lisa Ysidro Evert, Felipe Drone, RN, MHA    Virtual Visit No    Medication changes reported     No    Fall or balance concerns reported    No    Tobacco Cessation No Change    Warm-up and Cool-down Performed as group-led instruction    Resistance Training Performed Yes    VAD Patient? No    PAD/SET Patient? No      Pain Assessment   Currently in Pain? No/denies    Multiple Pain Sites No             Capillary Blood Glucose: No results found for this or any previous visit (from the past 24 hour(s)).    Social History   Tobacco Use  Smoking Status Former   Packs/day: 2.00   Years: 30.00   Pack years: 60.00   Types: Cigarettes   Quit date: 04/08/2013   Years since quitting: 7.9  Smokeless Tobacco Never    Goals Met:  Proper associated with RPD/PD & O2 Sat Exercise tolerated well No report of concerns or symptoms today Strength training completed today  Goals Unmet:  Not Applicable  Comments: Service time is from 1020 to 1140.    Dr. Rodman Pickle is Medical Director for Pulmonary Rehab at [facility].

## 2021-03-23 NOTE — Progress Notes (Signed)
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 Physical Activity;Increase Strength and Stamina;Able to understand and use rate of perceived exertion (RPE) scale;Able to understand and use Dyspnea scale;Knowledge and understanding of Target Heart Rate Range (THRR);Understanding of Exercise Prescription Increase Physical Activity;Increase Strength and Stamina;Able to understand and use rate of perceived exertion (RPE) scale;Able to understand and use Dyspnea scale;Knowledge and understanding of Target Heart Rate Range (THRR);Understanding of Exercise Prescription      Comments Mal has completed 3 exercise sessions. He exercises on the track and Nustep for 15 min. He averages 2.74 METs on the track and 2.1 METs at level 5 on the Nustep. On the 1st day of exercise, I had set his level to 1, but he stated that was easy. He increased his level from 1 to 4, which changed his RPE  from 9 to 11. I discussed the importance of increasing METs with [name] and motivated him to increase his pace. His METs moved from 1.8 to 2.1 after encouragement. Adonijah has completed Pulmonary Rehab before. He performs the warmup and cooldown standing without limitations. It is too soon to note any discernable progressions. Will continue to monitor and progress as able. Yer has completed 8 exercise sessions. He exercises for 15 min on the track and Nustep. He averages 2.74 METs on the track and 2.4 METs at level 5 on the Nustep. Sadat is motivated to exercise and increase his functional capacity. Thanh tries to push himself in rehab unless he has intense gout pain. He performs the warmup and cooldown standing without limitations. Will continue to monitor and progress as able.      Expected Outcomes Through exercise at rehab and home, the patient will decrease shortness of breath with daily activities and feel confident in carrying out an exercise regimen at home. Through exercise at rehab and home, the patient will decrease shortness of breath with daily activities and feel confident in carrying out an exercise regimen at home.  Discharge Exercise Prescription (Final Exercise Prescription Changes):  Exercise Prescription Changes - 03/15/21 1500       Response to Exercise   Blood Pressure (Admit) 120/72    Blood Pressure (Exercise) 132/72    Blood Pressure (Exit) 104/60    Heart Rate (Admit) 81 bpm    Heart Rate (Exercise) 95 bpm    Heart Rate (Exit) 85 bpm    Oxygen Saturation (Admit) 100 %    Oxygen Saturation (Exercise) 97 %    Oxygen Saturation (Exit) 97 %    Rating of Perceived Exertion (Exercise) 11    Perceived Dyspnea (Exercise) 1    Duration Continue with 30 min of aerobic exercise without signs/symptoms of physical distress.    Intensity THRR unchanged      Progression   Progression Continue to progress workloads to maintain intensity without signs/symptoms  of physical distress.      Resistance Training   Training Prescription Yes    Weight blue bands    Reps 10-15    Time 10 Minutes      Oxygen   Oxygen Continuous    Liters 3      NuStep   Level 5    SPM 80    Minutes 15    METs 2.3      Track   Laps 16    Minutes 15    METs 2.96      Oxygen   Maintain Oxygen Saturation 88% or higher             Nutrition:  Target Goals: Understanding of nutrition guidelines, daily intake of sodium <1579m, cholesterol <206m calories 30% from fat and 7% or less from saturated fats, daily to have 5 or more servings of fruits and vegetables.  Biometrics:  Pre Biometrics - 02/15/21 0903       Pre Biometrics   Grip Strength 35 kg              Nutrition Therapy Plan and Nutrition Goals:   Nutrition Assessments:  MEDIFICTS Score Key: ?70 Need to make dietary changes  40-70 Heart Healthy Diet ? 40 Therapeutic Level Cholesterol Diet   Picture Your Plate Scores: <4<06nhealthy dietary pattern with much room for improvement. 41-50 Dietary pattern unlikely to meet recommendations for good health and room for improvement. 51-60 More healthful dietary pattern, with some room for improvement.  >60 Healthy dietary pattern, although there may be some specific behaviors that could be improved.    Nutrition Goals Re-Evaluation:   Nutrition Goals Discharge (Final Nutrition Goals Re-Evaluation):   Psychosocial: Target Goals: Acknowledge presence or absence of significant depression and/or stress, maximize coping skills, provide positive support system. Participant is able to verbalize types and ability to use techniques and skills needed for reducing stress and depression.  Initial Review & Psychosocial Screening:  Initial Psych Review & Screening - 02/04/21 1035       Initial Review   Current issues with Current Sleep Concerns   Has completed sleep study     Family Dynamics   Good Support System? Yes    Comments lives  with wife, two grand children      Barriers   Psychosocial barriers to participate in program There are no identifiable barriers or psychosocial needs.      Screening Interventions   Interventions Encouraged to exercise    Expected Outcomes --             Quality of Life Scores:  Scores of 19  and below usually indicate a poorer quality of life in these areas.  A difference of  2-3 points is a clinically meaningful difference.  A difference of 2-3 points in the total score of the Quality of Life Index has been associated with significant improvement in overall quality of life, self-image, physical symptoms, and general health in studies assessing change in quality of life.  PHQ-9: Recent Review Flowsheet Data     Depression screen [facility] 2/9 02/04/2021 01/21/2018 12/04/2017 09/19/2017   Decreased Interest 0 0 0 0   Down, Depressed, Hopeless 3  1 0 1   PHQ - 2 Score 3 1 0 1   Altered sleeping 3  3 3 3    Tired, decreased energy 2  3 3 3    Change in appetite 0 1 0 0   Feeling bad or failure about yourself  0 1 1 1    Trouble concentrating 0 1 1 3    Moving slowly or fidgety/restless 0 1 1 1    Suicidal thoughts 0 0 0 1   PHQ-9 Score 8 11 9 13    Difficult doing work/chores Somewhat difficult Somewhat difficult Somewhat difficult Very difficult      Interpretation of Total Score  Total Score Depression Severity:  1-4 = Minimal depression, 5-9 = Mild depression, 10-14 = Moderate depression, 15-19 = Moderately severe depression, 20-27 = Severe depression   Psychosocial Evaluation and Intervention:  Psychosocial Evaluation - 02/04/21 1038       Psychosocial Evaluation & Interventions   Interventions Encouraged to exercise with the program and follow exercise prescription    Comments Pt practices meditation.    Continue Psychosocial Services  No Follow up required             Psychosocial Re-Evaluation:  Psychosocial Re-Evaluation     [place] Name 02/23/21 0959 03/23/21 1707            Psychosocial Re-Evaluation   Current issues with Current Sleep Concerns Current Sleep Concerns      Comments Ulices states that he is still having difficulty sleeping at this point he feels that it is related to his feet and heel pain.He reported that he had a sleep study 11/22 and that he was told that he no longer has sleep apnea. He no longer has his CPAP. He is using his oxygen 3 liters with sleep.He called out from PR due to foot pain 02/17/21 stated that he was unable to walk due to this. He called his PCP at the [place] and was given some pain medication. The PCP told him that is was nerve pain. He states that he has had gout in the past and he feels like that is his problem. He reports that the pain is slightly improved but that it is still uncomfortable.He was going to reach out to his PCP again. He has attended 4 exercise sessipns thus far and he is doing well. He is enjoying the PR program and he is increasing his workloads and METS.I expect if the pain in his feet improve he will be able to perform with his workouts. Marckus reports that he still has sleep issues some night. On the nights he sleeps well he feels better. He had a another sleep study and was told he no longer has sleep apnea.He thinks that he will just have to adapt to his lack of sleep on some nights. He has no other psychosocial issues identified. He has been able to participate in the PR program without any issues.  He seems to be enjoying it and making connections with others in his class. He has been increasing his workload and MET levels on the exercise equipment.      Expected Outcomes For Javarian to be able to continue to participate in the PR program without psychosocial needs or concerns.That his pain in his feet will improve and that his sleep pattern will follow. For Kholton to remain free of any psychosocial barriers that would prohibit  him from participating in the PR program.      Interventions Encouraged to attend  Pulmonary Rehabilitation for the exercise Encouraged to attend Pulmonary Rehabilitation for the exercise      Continue Psychosocial Services  Follow up required by staff No Follow up required               Psychosocial Discharge (Final Psychosocial Re-Evaluation):  Psychosocial Re-Evaluation - 03/23/21 1707       Psychosocial Re-Evaluation   Current issues with Current Sleep Concerns    Comments Arthuro reports that he still has sleep issues some night. On the nights he sleeps well he feels better. He had a another sleep study and was told he no longer has sleep apnea.He thinks that he will just have to adapt to his lack of sleep on some nights. He has no other psychosocial issues identified. He has been able to participate in the PR program without any issues. He seems to be enjoying it and making connections with others in his class. He has been increasing his workload and MET levels on the exercise equipment.    Expected Outcomes For Jeramiah to remain free of any psychosocial barriers that would prohibit  him from participating in the PR program.    Interventions Encouraged to attend Pulmonary Rehabilitation for the exercise    Continue Psychosocial Services  No Follow up required             Education: Education Goals: Education classes will be provided on a weekly basis, covering required topics. Participant will state understanding/return demonstration of topics presented.  Learning Barriers/Preferences:   Education Topics: Risk Factor Reduction:  -Group instruction that is supported by a PowerPoint presentation. Instructor discusses the definition of a risk factor, different risk factors for pulmonary disease, and how the heart and lungs work together.     Nutrition for Pulmonary Patient:  -Group instruction provided by PowerPoint slides, verbal discussion, and written materials to support subject matter. The instructor gives an explanation and review of healthy diet  recommendations, which includes a discussion on weight management, recommendations for fruit and vegetable consumption, as well as protein, fluid, caffeine, fiber, sodium, sugar, and alcohol. Tips for eating when patients are short of breath are discussed. Flowsheet Row PULMONARY REHAB CHRONIC OBSTRUCTIVE PULMONARY DISEASE from 03/17/2021 in [place]  Date 03/10/21  Educator Handout       Pursed Lip Breathing:  -Group instruction that is supported by demonstration and informational handouts. Instructor discusses the benefits of pursed lip and diaphragmatic breathing and detailed demonstration on how to preform both.     Oxygen Safety:  -Group instruction provided by PowerPoint, verbal discussion, and written material to support subject matter. There is an overview of What is Oxygen and Why do we need it.  Instructor also reviews how to create a safe environment for oxygen use, the importance of using oxygen as prescribed, and the risks of noncompliance. There is a brief discussion on traveling with oxygen and resources the patient may  utilize. Flowsheet Row PULMONARY REHAB OTHER RESPIRATORY from 04/25/2018 in [place]  Date 02/07/18  Educator Remo Lipps  Instruction Review Code 1- Verbalizes Understanding       Oxygen Equipment:  -Group instruction provided by [employer] utilizing handouts, written materials, and Insurance underwriter. Flowsheet Row PULMONARY REHAB OTHER RESPIRATORY from 04/25/2018 in [place]  Date 11/29/17  Educator Ace Gins  Instruction Review Code 1- Verbalizes Understanding       Signs and Symptoms:  -Group instruction provided by written material and verbal discussion to support subject matter. Warning signs and symptoms of infection, stroke, and heart attack are reviewed and when to call the physician/911 reinforced. Tips for preventing the spread of  infection discussed. Flowsheet Row PULMONARY REHAB OTHER RESPIRATORY from 04/25/2018 in [place]  Date 04/18/18  Educator Remo Lipps  Instruction Review Code 1- Verbalizes Understanding       Advanced Directives:  -Group instruction provided by verbal instruction and written material to support subject matter. Instructor reviews Advanced Directive laws and proper instruction for filling out document.   Pulmonary Video:  -Group video education that reviews the importance of medication and oxygen compliance, exercise, good nutrition, pulmonary hygiene, and pursed lip and diaphragmatic breathing for the pulmonary patient.   Exercise for the Pulmonary Patient:  -Group instruction that is supported by a PowerPoint presentation. Instructor discusses benefits of exercise, core components of exercise, frequency, duration, and intensity of an exercise routine, importance of utilizing pulse oximetry during exercise, safety while exercising, and options of places to exercise outside of rehab.   Flowsheet Row PULMONARY REHAB OTHER RESPIRATORY from 04/25/2018 in [place]  Date 04/11/18  Educator Kirk Ruths  Instruction Review Code 1- Verbalizes Understanding       Pulmonary Medications:  -Verbally interactive group education provided by instructor with focus on inhaled medications and proper administration. Flowsheet Row PULMONARY REHAB OTHER RESPIRATORY from 04/25/2018 in [place]  Date 03/12/18  Educator Pharmacist  Instruction Review Code 1- Verbalizes Understanding       Anatomy and Physiology of the Respiratory System and Intimacy:  -Group instruction provided by PowerPoint, verbal discussion, and written material to support subject matter. Instructor reviews respiratory cycle and anatomical components of the respiratory system and their functions. Instructor also reviews differences in obstructive and  restrictive respiratory diseases with examples of each. Intimacy, Sex, and Sexuality differences are reviewed with a discussion on how relationships can change when diagnosed with pulmonary disease. Common sexual concerns are reviewed. Flowsheet Row PULMONARY REHAB OTHER RESPIRATORY from 04/25/2018 in [place]  Date 03/21/18  Educator rn  Instruction Review Code 2- Demonstrated Understanding       MD DAY -A group question and answer session with a medical doctor that allows participants to ask questions that relate to their pulmonary disease state.   OTHER EDUCATION -Group or individual verbal, written, or video instructions that support the educational goals of the pulmonary rehab program. [place] from 03/17/2021 in [place]  Date 03/17/21  Educator Deanne Coffer your numbers]  Instruction Review Code 1- Verbalizes Understanding       Holiday Eating Survival Tips:  -Group instruction provided by PowerPoint slides, verbal discussion, and written materials to support subject matter. The instructor gives patients tips, tricks, and techniques to help them not only survive but  enjoy the holidays despite the onslaught of food that accompanies the holidays.   Knowledge Questionnaire Score:  Knowledge Questionnaire Score - 02/04/21 1042       Knowledge Questionnaire Score   Pre Score 16/18             Core Components/Risk Factors/Patient Goals at Admission:  Personal Goals and Risk Factors at Admission - 02/04/21 1148       Core Components/Risk Factors/Patient Goals on Admission   Improve shortness of breath with ADL's Yes    Intervention Provide education, individualized exercise plan and daily activity instruction to help decrease symptoms of SOB with activities of daily living.    Expected Outcomes Short Term: Improve cardiorespiratory fitness to  achieve a reduction of symptoms when performing ADLs;Long Term: Be able to perform more ADLs without symptoms or delay the onset of symptoms             Core Components/Risk Factors/Patient Goals Review:   Goals and Risk Factor Review     Row Name 02/23/21 1018 03/23/21 1717 03/23/21 1722         Core Components/Risk Factors/Patient Goals Review   Personal Goals Review Improve shortness of breath with ADL's;Develop more efficient breathing techniques such as purse lipped breathing and diaphragmatic breathing and practicing self-pacing with activity.;Increase knowledge of respiratory medications and ability to use respiratory devices properly. Improve shortness of breath with ADL's;Develop more efficient breathing techniques such as purse lipped breathing and diaphragmatic breathing and practicing self-pacing with activity.;Increase knowledge of respiratory medications and ability to use respiratory devices properly. Improve shortness of breath with ADL's;Develop more efficient breathing techniques such as purse lipped breathing and diaphragmatic breathing and practicing self-pacing with activity.;Increase knowledge of respiratory medications and ability to use respiratory devices properly.     Review Mayson is performing well even with the discomfot that he has been expercing in his feet. He is walking the track and is on the nu step. He has been increasing his workloads and METS levels.  He is requiring 3 liters of oxygen with exercise. Eagan is progressing well with his workloads and MET levels on the Nustep. He is also walking on the track. He is at level 5 on the nu step and MET of 2.5 at his highest. His laps on the track have increased to 19. He is using 3 liters of oxygen to  exercise. His saturations have been 94-97%. He uses 3 Pharoah is progressing well with his exercise. He is using the nu step and walking on the track. He is up to level 5 on the nu step and MET level as high as 2.5.  Walikng on the track he has had as many as 19 laps. He uses 3 liters of oxygen to exercise with saturations of 94-97%. He uses 3 liters of oxygen at home. He states that he is working fairly light with mid SOB with some difficulty.     Expected Outcomes see admission goals. That Jayven will be able to continue to perform exercise with increasing workloads and METS levels. -- For Efton to continue to  improve his workloads and MET levels, improve his SOB with ADL and to use improved breathing techniques.              Core Components/Risk Factors/Patient Goals at Discharge (Final Review):   Goals and Risk Factor Review - 03/23/21 1722       Core Components/Risk Factors/Patient Goals Review   Personal Goals Review Improve shortness of breath with  ADL's;Develop more efficient breathing techniques such as purse lipped breathing and diaphragmatic breathing and practicing self-pacing with activity.;Increase knowledge of respiratory medications and ability to use respiratory devices properly.    Review Neev is progressing well with his exercise. He is using the nu step and walking on the track. He is up to level 5 on the nu step and MET level as high as 2.5. Walikng on the track he has had as many as 19 laps. He uses 3 liters of oxygen to exercise with saturations of 94-97%. He uses 3 liters of oxygen at home. He states that he is working fairly light with mid SOB with some difficulty.    Expected Outcomes For Toni to continue to  improve his workloads and MET levels, improve his SOB with ADL and to use improved breathing techniques.             ITP Comments:   Comments: Dr. Rodman Pickle is Medical Director for Pulmonary Rehab at [facility].

## 2021-03-24 ENCOUNTER — Encounter (HOSPITAL_COMMUNITY)
Admission: RE | Admit: 2021-03-24 | Discharge: 2021-03-24 | Disposition: A | Discharge status: Home / Self Care | Payer: No Typology Code available for payment source | Source: Ambulatory Visit | Attending: Pulmonary Disease | Admitting: Pulmonary Disease

## 2021-03-24 ENCOUNTER — Other Ambulatory Visit: Payer: Self-pay

## 2021-03-24 DIAGNOSIS — J9611 Chronic respiratory failure with hypoxia: Secondary | ICD-10-CM | POA: Insufficient documentation

## 2021-03-24 DIAGNOSIS — J449 Chronic obstructive pulmonary disease, unspecified: Secondary | ICD-10-CM | POA: Insufficient documentation

## 2021-03-24 NOTE — Progress Notes (Signed)
Daily Session Note  Patient Details  Name: RIAD WAGLEY MRN: [id number] Date of Birth: August 11, 1960 Referring Provider:   April Manson Pulmonary Rehab Walk Test from 02/04/2021 in [place]  Referring Provider [name]       Encounter Date: 03/24/2021  Check In:  Session Check In - 03/24/21 1207       Check-In   Supervising physician immediately available to respond to emergencies [facility] immediately available    Physician(s) Dr. Doristine Bosworth    Location [facility]    Staff Present Esmeralda Links BS, ACSM EP-C, Exercise Physiologist;Carlette Wilber Oliphant, RN, Roque Cash, RN    Virtual Visit No    Medication changes reported     No    Fall or balance concerns reported    No    Tobacco Cessation No Change    Warm-up and Cool-down Performed as group-led instruction    Resistance Training Performed Yes    VAD Patient? No    PAD/SET Patient? No      Pain Assessment   Currently in Pain? No/denies    Pain Score 0-No pain    Multiple Pain Sites No             Capillary Blood Glucose: No results found for this or any previous visit (from the past 24 hour(s)).    Social History   Tobacco Use  Smoking Status Former   Packs/day: 2.00   Years: 30.00   Pack years: 60.00   Types: Cigarettes   Quit date: 04/08/2013   Years since quitting: 7.9  Smokeless Tobacco Never    Goals Met:  Exercise tolerated well No report of concerns or symptoms today Strength training completed today  Goals Unmet:  Not Applicable  Comments: Service time is from 1020 to [place]   Dr. Rodman Pickle is Medical Director for Pulmonary Rehab at [facility].

## 2021-03-29 ENCOUNTER — Encounter (HOSPITAL_COMMUNITY)
Admission: RE | Admit: 2021-03-29 | Discharge: 2021-03-29 | Disposition: A | Discharge status: Home / Self Care | Payer: No Typology Code available for payment source | Source: Ambulatory Visit | Attending: Pulmonary Disease | Admitting: Pulmonary Disease

## 2021-03-29 ENCOUNTER — Other Ambulatory Visit: Payer: Self-pay

## 2021-03-29 VITALS — Wt 233.7 lb

## 2021-03-29 DIAGNOSIS — J449 Chronic obstructive pulmonary disease, unspecified: Secondary | ICD-10-CM | POA: Diagnosis not present

## 2021-03-29 NOTE — Progress Notes (Signed)
Daily Session Note  Patient Details  Name: Vincent Sutton MRN: [id number] Date of Birth: [birth year] Referring Provider:   April Manson Pulmonary Rehab Walk Test from 02/04/2021 in [place]  Referring Provider [name]       Encounter Date: 03/29/2021  Check In:  Session Check In - 03/29/21 1133       Check-In   Supervising physician immediately available to respond to emergencies [facility] immediately available    Physician(s) Dr. Doristine Bosworth    Location [facility]    Staff Present Maurice Small, RN, BSN;Jazlyne Gauger Ysidro Evert, Cathleen Fears, MS, ACSM-CEP, Exercise Physiologist    Virtual Visit No    Medication changes reported     No    Fall or balance concerns reported    No    Tobacco Cessation No Change    Warm-up and Cool-down Performed as group-led instruction    Resistance Training Performed Yes    VAD Patient? No    PAD/SET Patient? No      Pain Assessment   Currently in Pain? No/denies    Multiple Pain Sites No             Capillary Blood Glucose: No results found for this or any previous visit (from the past 24 hour(s)).   Exercise Prescription Changes - 03/29/21 1100       Response to Exercise   Blood Pressure (Admit) 120/70    Blood Pressure (Exercise) 130/70    Blood Pressure (Exit) 132/70    Heart Rate (Admit) 83 bpm    Heart Rate (Exercise) 94 bpm    Heart Rate (Exit) 83 bpm    Oxygen Saturation (Admit) 100 %    Oxygen Saturation (Exercise) 98 %    Oxygen Saturation (Exit) 100 %    Rating of Perceived Exertion (Exercise) 15    Perceived Dyspnea (Exercise) 2    Duration Continue with 30 min of aerobic exercise without signs/symptoms of physical distress.      Progression   Progression Continue to progress workloads to maintain intensity without signs/symptoms of physical distress.      Resistance Training   Training Prescription Yes    Weight Blue bands    Reps 10-15    Time 10 Minutes       Oxygen   Oxygen Continuous    Liters 3      NuStep   Level 5    SPM 80    Minutes 15    METs 2      Track   Laps 16    Minutes 15             Social History   Tobacco Use  Smoking Status Former   Packs/day: 2.00   Years: 30.00   Pack years: 60.00   Types: Cigarettes   Quit date: 04/08/2013   Years since quitting: 7.9  Smokeless Tobacco Never    Goals Met:  Exercise tolerated well No report of concerns or symptoms today Strength training completed today  Goals Unmet:  Not Applicable  Comments: Service time is from 1020 to [place]    Dr. Rodman Pickle is Medical Director for Pulmonary Rehab at [facility].

## 2021-03-31 ENCOUNTER — Encounter (HOSPITAL_COMMUNITY): Admission: RE | Admit: 2021-03-31 | Payer: No Typology Code available for payment source | Source: Ambulatory Visit

## 2021-03-31 ENCOUNTER — Telehealth (HOSPITAL_COMMUNITY): Payer: Self-pay

## 2021-04-05 ENCOUNTER — Other Ambulatory Visit: Payer: Self-pay

## 2021-04-05 ENCOUNTER — Encounter (HOSPITAL_COMMUNITY)
Admission: RE | Admit: 2021-04-05 | Discharge: 2021-04-05 | Disposition: A | Discharge status: Home / Self Care | Payer: No Typology Code available for payment source | Source: Ambulatory Visit | Attending: Pulmonary Disease | Admitting: Pulmonary Disease

## 2021-04-05 DIAGNOSIS — J449 Chronic obstructive pulmonary disease, unspecified: Secondary | ICD-10-CM

## 2021-04-05 NOTE — Progress Notes (Signed)
Daily Session Note  Patient Details  Name: Vincent Sutton MRN: [id number] Date of Birth: 08-17-1960 Referring Provider:   April Manson Pulmonary Rehab Walk Test from 02/04/2021 in [place]  Referring Provider [name]       Encounter Date: 04/05/2021  Check In:  Session Check In - 04/05/21 1124       Check-In   Supervising physician immediately available to respond to emergencies [facility] immediately available    Physician(s) Dr. Eliseo Squires    Location [facility]    Staff Present Elmon Else, MS, ACSM-CEP, Exercise Physiologist;Carlette Wilber Oliphant, RN, Roque Cash, RN    Virtual Visit No    Medication changes reported     No    Fall or balance concerns reported    No    Tobacco Cessation No Change    Warm-up and Cool-down Performed as group-led instruction    Resistance Training Performed Yes    VAD Patient? No    PAD/SET Patient? No      Pain Assessment   Currently in Pain? No/denies    Multiple Pain Sites No             Capillary Blood Glucose: No results found for this or any previous visit (from the past 24 hour(s)).    Social History   Tobacco Use  Smoking Status Former   Packs/day: 2.00   Years: 30.00   Pack years: 60.00   Types: Cigarettes   Quit date: 04/08/2013   Years since quitting: 7.9  Smokeless Tobacco Never    Goals Met:  Exercise tolerated well No report of concerns or symptoms today Strength training completed today  Goals Unmet:  Not Applicable  Comments: Service time is from 1020 to [place]    Dr. Rodman Pickle is Medical Director for Pulmonary Rehab at [facility].

## 2021-04-06 ENCOUNTER — Ambulatory Visit (INDEPENDENT_AMBULATORY_CARE_PROVIDER_SITE_OTHER): Payer: No Typology Code available for payment source | Admitting: Pulmonary Disease

## 2021-04-06 ENCOUNTER — Encounter: Payer: Self-pay | Admitting: Pulmonary Disease

## 2021-04-06 VITALS — BP 112/70 | HR 89 | Temp 98.1°F | Ht 68.0 in | Wt 232.8 lb

## 2021-04-06 DIAGNOSIS — J9611 Chronic respiratory failure with hypoxia: Secondary | ICD-10-CM | POA: Diagnosis not present

## 2021-04-06 DIAGNOSIS — J449 Chronic obstructive pulmonary disease, unspecified: Secondary | ICD-10-CM | POA: Diagnosis not present

## 2021-04-06 MED ORDER — STIOLTO RESPIMAT 2.5-2.5 MCG/ACT IN AERS: 2.0000 | INHALATION_SPRAY | Freq: Every day | RESPIRATORY_TRACT | 11 refills | Status: DC

## 2021-04-06 NOTE — Patient Instructions (Addendum)
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 Electronic and printed script --CONTINUE Duonebs as needed. --Continue regular aerobic exercise daily  Chronic hypoxemic respiratory failure --Wear 3L with activity and sleep --Goal SpO2 >88%  Follow-up with me in 4 months
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
## 2021-04-07 ENCOUNTER — Other Ambulatory Visit: Payer: Self-pay

## 2021-04-07 ENCOUNTER — Encounter (HOSPITAL_COMMUNITY)
Admission: RE | Admit: 2021-04-07 | Discharge: 2021-04-07 | Disposition: A | Discharge status: Home / Self Care | Payer: No Typology Code available for payment source | Source: Ambulatory Visit | Attending: Pulmonary Disease | Admitting: Pulmonary Disease

## 2021-04-07 DIAGNOSIS — J449 Chronic obstructive pulmonary disease, unspecified: Secondary | ICD-10-CM | POA: Diagnosis not present

## 2021-04-07 NOTE — Progress Notes (Signed)
Daily Session Note  Patient Details  Name: NICHOLAOS SCHIPPERS MRN: [id number] Date of Birth: 1960/05/21 Referring Provider:   April Manson Pulmonary Rehab Walk Test from 02/04/2021 in [place]  Referring Provider [name]       Encounter Date: 04/07/2021  Check In:  Session Check In - 04/07/21 1119       Check-In   Supervising physician immediately available to respond to emergencies [facility] immediately available    Physician(s) Dr. Cruzita Lederer    Location [facility]    Staff Present Rosebud Poles, RN, BSN;Ramon Dredge, RN, Fernande Bras, MS, ACSM-CEP, Exercise Physiologist    Virtual Visit No    Medication changes reported     No    Fall or balance concerns reported    No    Tobacco Cessation No Change    Warm-up and Cool-down Performed as group-led instruction    Resistance Training Performed Yes    VAD Patient? No    PAD/SET Patient? No      Pain Assessment   Currently in Pain? No/denies    Multiple Pain Sites No             Capillary Blood Glucose: No results found for this or any previous visit (from the past 24 hour(s)).    Social History   Tobacco Use  Smoking Status Former   Packs/day: 2.00   Years: 30.00   Pack years: 60.00   Types: Cigarettes   Quit date: 04/08/2013   Years since quitting: 8.0  Smokeless Tobacco Never    Goals Met:  Proper associated with RPD/PD & O2 Sat Independence with exercise equipment Exercise tolerated well No report of concerns or symptoms today Strength training completed today  Goals Unmet:  Not Applicable  Comments: Service time is from 1000 to 1140. Completed post 6 MWT.   Dr. Rodman Pickle is Medical Director for Pulmonary Rehab at [facility].

## 2021-04-12 NOTE — Progress Notes (Signed)
Discharge Progress Report  Patient Details  Name: Vincent Sutton MRN: [id number] Date of Birth: 04-06-1960 Referring Provider:   April [facility] Walk Test from 02/04/2021 in [place]  Referring Provider [name]        Number of Visits: 15  Reason for Discharge:  Patient has met program and personal goals.  Smoking History:  Social History   Tobacco Use  Smoking Status Former   Packs/day: 2.00   Years: 30.00   Pack years: 60.00   Types: Cigarettes   Quit date: 04/08/2013   Years since quitting: 8.0  Smokeless Tobacco Never    Diagnosis:  Chronic obstructive pulmonary disease, unspecified COPD type ([place])  ADL [facility]:  Pulmonary Assessment Scores     Row Name 02/04/21 1041 03/29/21 1507 04/12/21 0749     ADL [facility]   ADL Phase Entry Exit --   SOB Score total 73 105 --     CAT Score   CAT Score 20 27 --     mMRC Score   mMRC Score 4 -- 4            Initial Exercise Prescription:  Initial Exercise Prescription - 02/04/21 1100       Date of Initial Exercise RX and Referring Provider   Date 02/04/21    Referring Provider [name]    Expected Discharge Date 04/07/21      Oxygen   Oxygen Continuous    Liters 3    Maintain Oxygen Saturation 88% or higher      NuStep   Level 1    SPM 80    Minutes 15    METs 2      Track   Minutes 15    METs 3.27      Prescription Details   Frequency (times per week) 2    Duration Progress to 30 minutes of continuous aerobic without signs/symptoms of physical distress      Intensity   THRR 40-80% of Max Heartrate 64-128    Ratings of Perceived Exertion 11-13    Perceived Dyspnea 0-4      Progression   Progression Continue to progress workloads to maintain intensity without signs/symptoms of physical distress.      Resistance Training   Training Prescription Yes    Weight blue bands    Reps 10-15             Discharge Exercise Prescription (Final  Exercise Prescription Changes):  Exercise Prescription Changes - 03/29/21 1100       Response to Exercise   Blood Pressure (Admit) 120/70    Blood Pressure (Exercise) 130/70    Blood Pressure (Exit) 132/70    Heart Rate (Admit) 83 bpm    Heart Rate (Exercise) 94 bpm    Heart Rate (Exit) 83 bpm    Oxygen Saturation (Admit) 100 %    Oxygen Saturation (Exercise) 98 %    Oxygen Saturation (Exit) 100 %    Rating of Perceived Exertion (Exercise) 15    Perceived Dyspnea (Exercise) 2    Duration Continue with 30 min of aerobic exercise without signs/symptoms of physical distress.      Progression   Progression Continue to progress workloads to maintain intensity without signs/symptoms of physical distress.      Resistance Training   Training Prescription Yes    Weight Blue bands    Reps 10-15    Time 10 Minutes      Oxygen  Oxygen Continuous    Liters 3      NuStep   Level 5    SPM 80    Minutes 15    METs 2      Track   Laps 16    Minutes 15             Functional Capacity:  6 Minute Walk     Row Name 02/04/21 1129 04/07/21 1216       6 Minute Walk   Phase Initial Discharge    Distance 1420 feet 1610 feet    Distance % Change -- 13.38 %    Distance Feet Change -- 190 ft    Walk Time 6 minutes 6 minutes    # of Rest Breaks 0 0    MPH 2.69 3.05    METS 3.27 3.89    RPE 15 13    Perceived Dyspnea  1 2    VO2 Peak 11.45 13.61    Symptoms No No    Resting HR 89 bpm 95 bpm    Resting BP 114/60 132/60    Resting Oxygen Saturation  97 % 99 %    Exercise Oxygen Saturation  during 6 min walk 91 % 87 %    Max Ex. HR 96 bpm 121 bpm    Max Ex. BP 138/70 148/78    2 Minute Post BP 120/70 122/76      Interval HR   1 Minute HR 92 108    2 Minute HR 91 109    3 Minute HR 91 110    4 Minute HR 96 121    5 Minute HR 88 121    6 Minute HR 89 118    2 Minute Post HR 85 106    Interval Heart Rate? Yes Yes      Interval Oxygen   Interval Oxygen? Yes Yes     Baseline Oxygen Saturation % 97 %  3L 99 %    1 Minute Oxygen Saturation % 95 % 95 %    1 Minute Liters of Oxygen 3 L 3 L    2 Minute Oxygen Saturation % 92 % 90 %    2 Minute Liters of Oxygen 3 L 3 L    3 Minute Oxygen Saturation % 92 % 87 %    3 Minute Liters of Oxygen 3 L 3 L  Increased to 4L    4 Minute Oxygen Saturation % 91 % 88 %    4 Minute Liters of Oxygen 3 L 4 L    5 Minute Oxygen Saturation % 91 % 91 %    5 Minute Liters of Oxygen 3 L 4 L    6 Minute Oxygen Saturation % 91 % 90 %    6 Minute Liters of Oxygen 3 L 4 L    2 Minute Post Oxygen Saturation % 98 % 98 %    2 Minute Post Liters of Oxygen 3 L 4 L             Psychological, QOL, Others - Outcomes: PHQ 2/9: Depression screen [facility] 2/9 04/07/2021 02/04/2021 01/21/2018 12/04/2017 09/19/2017  Decreased Interest 0 0 0 0 0  Down, Depressed, Hopeless 0 3 1 0 1  PHQ - 2 Score 0 3 1 0 1  Altered sleeping _0 Tired, decreased energy _1 Change in appetite 0 0 1 0 0  Feeling bad or failure about  yourself  0 0 _0 Trouble concentrating 0 0 _1 Moving slowly or fidgety/restless 0 0 _2 Suicidal thoughts 0 0 0 0 1  PHQ-9 Score _3 Difficult doing work/chores Somewhat difficult Somewhat difficult Somewhat difficult Somewhat difficult Very difficult  Some recent data might be hidden    Quality of Life:   Personal Goals: Goals established at orientation with interventions provided to work toward goal.  Personal Goals and Risk Factors at Admission - 02/04/21 1148       Core Components/Risk Factors/Patient Goals on Admission   Improve shortness of breath with ADL's Yes    Intervention Provide education, individualized exercise plan and daily activity instruction to help decrease symptoms of SOB with activities of daily living.    Expected Outcomes Short Term: Improve cardiorespiratory fitness to achieve a reduction of symptoms when performing ADLs;Long Term: Be able to perform more ADLs  without symptoms or delay the onset of symptoms              Personal Goals Discharge:  Goals and Risk Factor Review     Row Name 02/23/21 1018 03/23/21 1717 03/23/21 1722         Core Components/Risk Factors/Patient Goals Review   Personal Goals Review Improve shortness of breath with ADL's;Develop more efficient breathing techniques such as purse lipped breathing and diaphragmatic breathing and practicing self-pacing with activity.;Increase knowledge of respiratory medications and ability to use respiratory devices properly. Improve shortness of breath with ADL's;Develop more efficient breathing techniques such as purse lipped breathing and diaphragmatic breathing and practicing self-pacing with activity.;Increase knowledge of respiratory medications and ability to use respiratory devices properly. Improve shortness of breath with ADL's;Develop more efficient breathing techniques such as purse lipped breathing and diaphragmatic breathing and practicing self-pacing with activity.;Increase knowledge of respiratory medications and ability to use respiratory devices properly.     Review Vincent Sutton is performing well even with the discomfot that he has been expercing in his feet. He is walking the track and is on the nu step. He has been increasing his workloads and METS levels.  He is requiring 3 liters of oxygen with exercise. Vincent Sutton is progressing well with his workloads and MET levels on the Nustep. He is also walking on the track. He is at level 5 on the nu step and MET of 2.5 at his highest. His laps on the track have increased to 19. He is using 3 liters of oxygen to  exercise. His saturations have been 94-97%. He uses 3 [place] is progressing well with his exercise. He is using the nu step and walking on the track. He is up to level 5 on the nu step and MET level as high as 2.5. Walikng on the track he has had as many as 19 laps. He uses 3 liters of oxygen to exercise with saturations of 94-97%. He  uses 3 liters of oxygen at home. He states that he is working fairly light with mid SOB with some difficulty.     Expected Outcomes see admission goals. That Vincent Sutton will be able to continue to perform exercise with increasing workloads and METS levels. -- For Vincent Sutton to continue to  improve his workloads and MET levels, improve his SOB with ADL and to use improved breathing techniques.              Exercise Goals and Review:  Exercise Goals     Row Name 02/04/21  1045 02/15/21 0854 03/14/21 1050         Exercise Goals   Increase Physical Activity Yes Yes Yes     Intervention Provide advice, education, support and counseling about physical activity/exercise needs.;Develop an individualized exercise prescription for aerobic and resistive training based on initial evaluation findings, risk stratification, comorbidities and participant's personal goals. Provide advice, education, support and counseling about physical activity/exercise needs.;Develop an individualized exercise prescription for aerobic and resistive training based on initial evaluation findings, risk stratification, comorbidities and participant's personal goals. Provide advice, education, support and counseling about physical activity/exercise needs.;Develop an individualized exercise prescription for aerobic and resistive training based on initial evaluation findings, risk stratification, comorbidities and participant's personal goals.     Expected Outcomes Short Term: Attend rehab on a regular basis to increase amount of physical activity.;Long Term: Add in home exercise to make exercise part of routine and to increase amount of physical activity.;Long Term: Exercising regularly at least 3-5 days a week. Short Term: Attend rehab on a regular basis to increase amount of physical activity.;Long Term: Add in home exercise to make exercise part of routine and to increase amount of physical activity.;Long Term: Exercising regularly at least  3-5 days a week. Short Term: Attend rehab on a regular basis to increase amount of physical activity.;Long Term: Add in home exercise to make exercise part of routine and to increase amount of physical activity.;Long Term: Exercising regularly at least 3-5 days a week.     Increase Strength and Stamina Yes Yes Yes     Intervention Provide advice, education, support and counseling about physical activity/exercise needs.;Develop an individualized exercise prescription for aerobic and resistive training based on initial evaluation findings, risk stratification, comorbidities and participant's personal goals. Provide advice, education, support and counseling about physical activity/exercise needs.;Develop an individualized exercise prescription for aerobic and resistive training based on initial evaluation findings, risk stratification, comorbidities and participant's personal goals. Provide advice, education, support and counseling about physical activity/exercise needs.;Develop an individualized exercise prescription for aerobic and resistive training based on initial evaluation findings, risk stratification, comorbidities and participant's personal goals.     Expected Outcomes Short Term: Increase workloads from initial exercise prescription for resistance, speed, and METs.;Short Term: Perform resistance training exercises routinely during rehab and add in resistance training at home;Long Term: Improve cardiorespiratory fitness, muscular endurance and strength as measured by increased METs and functional capacity (6MWT) Short Term: Increase workloads from initial exercise prescription for resistance, speed, and METs.;Short Term: Perform resistance training exercises routinely during rehab and add in resistance training at home;Long Term: Improve cardiorespiratory fitness, muscular endurance and strength as measured by increased METs and functional capacity (6MWT) Short Term: Increase workloads from initial  exercise prescription for resistance, speed, and METs.;Short Term: Perform resistance training exercises routinely during rehab and add in resistance training at home;Long Term: Improve cardiorespiratory fitness, muscular endurance and strength as measured by increased METs and functional capacity (6MWT)     Able to understand and use rate of perceived exertion (RPE) scale Yes Yes Yes     Intervention Provide education and explanation on how to use RPE scale Provide education and explanation on how to use RPE scale Provide education and explanation on how to use RPE scale     Expected Outcomes Short Term: Able to use RPE daily in rehab to express subjective intensity level;Long Term:  Able to use RPE to guide intensity level when exercising independently Short Term: Able to use RPE daily in rehab to express subjective  intensity level;Long Term:  Able to use RPE to guide intensity level when exercising independently Short Term: Able to use RPE daily in rehab to express subjective intensity level;Long Term:  Able to use RPE to guide intensity level when exercising independently     Able to understand and use Dyspnea scale Yes Yes Yes     Intervention Provide education and explanation on how to use Dyspnea scale Provide education and explanation on how to use Dyspnea scale Provide education and explanation on how to use Dyspnea scale     Expected Outcomes Short Term: Able to use Dyspnea scale daily in rehab to express subjective sense of shortness of breath during exertion;Long Term: Able to use Dyspnea scale to guide intensity level when exercising independently Short Term: Able to use Dyspnea scale daily in rehab to express subjective sense of shortness of breath during exertion;Long Term: Able to use Dyspnea scale to guide intensity level when exercising independently Short Term: Able to use Dyspnea scale daily in rehab to express subjective sense of shortness of breath during exertion;Long Term: Able to use  Dyspnea scale to guide intensity level when exercising independently     Knowledge and understanding of Target Heart Rate Range (THRR) Yes Yes Yes     Intervention Provide education and explanation of THRR including how the numbers were predicted and where they are located for reference Provide education and explanation of THRR including how the numbers were predicted and where they are located for reference Provide education and explanation of THRR including how the numbers were predicted and where they are located for reference     Expected Outcomes Short Term: Able to state/look up THRR;Long Term: Able to use THRR to govern intensity when exercising independently;Short Term: Able to use daily as guideline for intensity in rehab Short Term: Able to state/look up THRR;Long Term: Able to use THRR to govern intensity when exercising independently;Short Term: Able to use daily as guideline for intensity in rehab Short Term: Able to state/look up THRR;Long Term: Able to use THRR to govern intensity when exercising independently;Short Term: Able to use daily as guideline for intensity in rehab     Understanding of Exercise Prescription Yes Yes Yes     Intervention Provide education, explanation, and written materials on patient's individual exercise prescription Provide education, explanation, and written materials on patient's individual exercise prescription Provide education, explanation, and written materials on patient's individual exercise prescription     Expected Outcomes Short Term: Able to explain program exercise prescription;Long Term: Able to explain home exercise prescription to exercise independently Short Term: Able to explain program exercise prescription;Long Term: Able to explain home exercise prescription to exercise independently Short Term: Able to explain program exercise prescription;Long Term: Able to explain home exercise prescription to exercise independently               Exercise Goals Re-Evaluation:  Exercise Goals Re-Evaluation     Row Name 02/15/21 0854 03/14/21 1050           Exercise Goal Re-Evaluation   Exercise Goals Review Increase Physical Activity;Increase Strength and Stamina;Able to understand and use rate of perceived exertion (RPE) scale;Able to understand and use Dyspnea scale;Knowledge and understanding of Target Heart Rate Range (THRR);Understanding of Exercise Prescription Increase Physical Activity;Increase Strength and Stamina;Able to understand and use rate of perceived exertion (RPE) scale;Able to understand and use Dyspnea scale;Knowledge and understanding of Target Heart Rate Range (THRR);Understanding of Exercise Prescription      Comments Vincent Sutton has  completed 3 exercise sessions. He exercises on the track and Nustep for 15 min. He averages 2.74 METs on the track and 2.1 METs at level 5 on the Nustep. On the 1st day of exercise, I had set his level to 1, but he stated that was easy. He increased his level from 1 to 4, which changed his RPE from 9 to 11. I discussed the importance of increasing METs with Vincent Sutton and motivated him to increase his pace. His METs moved from 1.8 to 2.1 after encouragement. Vincent Sutton has completed Pulmonary Rehab before. He performs the warmup and cooldown standing without limitations. It is too soon to note any discernable progressions. Will continue to monitor and progress as able. Vincent Sutton has completed 8 exercise sessions. He exercises for 15 min on the track and Nustep. He averages 2.74 METs on the track and 2.4 METs at level 5 on the Nustep. Vincent Sutton is motivated to exercise and increase his functional capacity. Vincent Sutton tries to push himself in rehab unless he has intense gout pain. He performs the warmup and cooldown standing without limitations. Will continue to monitor and progress as able.      Expected Outcomes Through exercise at rehab and home, the patient will decrease shortness of breath with daily  activities and feel confident in carrying out an exercise regimen at home. Through exercise at rehab and home, the patient will decrease shortness of breath with daily activities and feel confident in carrying out an exercise regimen at home.               Nutrition & Weight - Outcomes:  Pre Biometrics - 02/15/21 0903       Pre Biometrics   Grip Strength 35 kg              Nutrition:   Nutrition Discharge:   Education Questionnaire Score:  Knowledge Questionnaire Score - 03/29/21 1508       Knowledge Questionnaire Score   Post Score 18/18             Goals reviewed with patient; copy given to patient.

## 2021-07-27 ENCOUNTER — Ambulatory Visit (INDEPENDENT_AMBULATORY_CARE_PROVIDER_SITE_OTHER): Payer: No Typology Code available for payment source | Admitting: Nurse Practitioner

## 2021-07-27 ENCOUNTER — Encounter: Payer: Self-pay | Admitting: Nurse Practitioner

## 2021-07-27 ENCOUNTER — Ambulatory Visit (INDEPENDENT_AMBULATORY_CARE_PROVIDER_SITE_OTHER): Payer: No Typology Code available for payment source

## 2021-07-27 VITALS — BP 138/70 | HR 79 | Temp 97.7°F

## 2021-07-27 DIAGNOSIS — J441 Chronic obstructive pulmonary disease with (acute) exacerbation: Secondary | ICD-10-CM

## 2021-07-27 DIAGNOSIS — E0590 Thyrotoxicosis, unspecified without thyrotoxic crisis or storm: Secondary | ICD-10-CM

## 2021-07-27 DIAGNOSIS — J9611 Chronic respiratory failure with hypoxia: Secondary | ICD-10-CM

## 2021-07-27 MED ORDER — DOXYCYCLINE HYCLATE 100 MG PO TABS: 100.0000 mg | ORAL_TABLET | Freq: Two times a day (BID) | ORAL | 0 refills | Status: AC

## 2021-07-27 MED ORDER — METHYLPREDNISOLONE ACETATE 80 MG/ML IJ SUSP
80.0000 mg | Freq: Once | INTRAMUSCULAR | Status: AC
  Administered 2021-07-27: 80 mg via INTRAMUSCULAR

## 2021-07-27 MED ORDER — PREDNISONE 10 MG PO TABS: ORAL_TABLET | ORAL | 0 refills | Status: DC

## 2021-07-27 MED ORDER — METHYLPREDNISOLONE ACETATE 80 MG/ML IJ SUSP: 80.0000 mg | Freq: Once | INTRAMUSCULAR | Status: DC
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
## 2021-07-27 NOTE — Patient Instructions (Addendum)
Continue Stiolto 2 puffs daily  Continue Asmanex 2 puffs daily. Brush tongue and rinse mouth afterwards Continue Albuterol inhaler 2 puffs or duoneb 3 mL neb every 6 hours as needed for shortness of breath or wheezing. Notify if symptoms persist despite rescue inhaler/neb use. Use neb twice daily until symptoms improve  Continue omeprazole 20 mg daily  Continue daliresp 500 mcg daily  Continue supplemental oyxgen 2-3 lpm for oxygen saturation goal >88-90%  Prednisone taper. 4 tabs for 2 days, then 3 tabs for 2 days, 2 tabs for 2 days, then 1 tab for 2 days, then stop. Take in AM with food. Start tomorrow. Doxycycline 1 tab Twice daily for 7 days. Take with food. Wear sunscreen when outside and avoid direct sun exposure Mucinex 600 mg Twice daily for chest congestion   Chest x ray today   Follow up in one week with Dr. [name] (1st)  or Katie Zoraida Havrilla,NP (2nd). If symptoms do not improve or worsen, please contact office for sooner follow up or seek emergency care.

## 2021-07-28 ENCOUNTER — Encounter: Payer: Self-pay | Admitting: Nurse Practitioner

## 2021-07-28 DIAGNOSIS — E0590 Thyrotoxicosis, unspecified without thyrotoxic crisis or storm: Secondary | ICD-10-CM | POA: Insufficient documentation

## 2021-07-28 NOTE — Progress Notes (Signed)
Please notify patient CXR showed unchanged chronic changes, consistent with emphysema/COPD. No acute disease or infection present. Continue with our plan as discussed yesterday.

## 2021-07-28 NOTE — Assessment & Plan Note (Signed)
Stable without any increased O2 demand. Continue on supplemental oxygen 3 lpm for goal >88-90%.

## 2021-07-28 NOTE — Assessment & Plan Note (Signed)
AECOPD; unresolved with prednisone burst. Depo 80 mg inj x1. Treat with prednisone taper and doxy course. CXR today without evidence of superimposed infection. Currently on Stiolto and Asmanex for triple therapy. Close follow up.  Patient Instructions  Continue Stiolto 2 puffs daily  Continue Asmanex 2 puffs daily. Brush tongue and rinse mouth afterwards Continue Albuterol inhaler 2 puffs or duoneb 3 mL neb every 6 hours as needed for shortness of breath or wheezing. Notify if symptoms persist despite rescue inhaler/neb use. Use neb twice daily until symptoms improve  Continue omeprazole 20 mg daily  Continue daliresp 500 mcg daily  Continue supplemental oyxgen 2-3 lpm for oxygen saturation goal >88-90%  Prednisone taper. 4 tabs for 2 days, then 3 tabs for 2 days, 2 tabs for 2 days, then 1 tab for 2 days, then stop. Take in AM with food. Start tomorrow. Doxycycline 1 tab Twice daily for 7 days. Take with food. Wear sunscreen when outside and avoid direct sun exposure Mucinex 600 mg Twice daily for chest congestion   Chest x ray today   Follow up in one week with Dr. [name] (1st)  or Katie Ashlin Hidalgo,NP (2nd). If symptoms do not improve or worsen, please contact office for sooner follow up or seek emergency care.

## 2021-07-28 NOTE — Assessment & Plan Note (Signed)
Seen by endocrinology and started on Methimazole. Feels as though some of his other symptoms have improved slightly but his respiratory symptoms persist and feel similar to past exacerbations. Tx for AECOPD. Follow up with endocrine as scheduled.

## 2021-08-02 ENCOUNTER — Ambulatory Visit: Payer: No Typology Code available for payment source | Admitting: Pulmonary Disease

## 2021-08-04 ENCOUNTER — Encounter: Payer: Self-pay | Admitting: Pulmonary Disease

## 2021-08-04 ENCOUNTER — Ambulatory Visit (INDEPENDENT_AMBULATORY_CARE_PROVIDER_SITE_OTHER): Payer: No Typology Code available for payment source | Admitting: Pulmonary Disease

## 2021-08-04 VITALS — BP 124/80 | HR 89 | Temp 98.0°F | Ht 68.0 in | Wt 207.0 lb

## 2021-08-04 DIAGNOSIS — J449 Chronic obstructive pulmonary disease, unspecified: Secondary | ICD-10-CM | POA: Diagnosis not present

## 2021-08-04 DIAGNOSIS — J441 Chronic obstructive pulmonary disease with (acute) exacerbation: Secondary | ICD-10-CM | POA: Diagnosis not present

## 2021-08-04 DIAGNOSIS — J9611 Chronic respiratory failure with hypoxia: Secondary | ICD-10-CM | POA: Diagnosis not present

## 2021-08-04 MED ORDER — BUDESONIDE 0.5 MG/2ML IN SUSP: 0.5000 mg | Freq: Two times a day (BID) | RESPIRATORY_TRACT | 2 refills | Status: DC

## 2021-08-04 MED ORDER — PREDNISONE 10 MG PO TABS: ORAL_TABLET | ORAL | 0 refills | Status: AC
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
## 2021-08-04 NOTE — Patient Instructions (Signed)
COPD, very severe (FEV 29% in 2016) in exacerbation Emphysema GOLD class D --Prednisone taper --CONTINUE Stiolto 2.5/2.5 TWO puffs ONCE a day --CONTINUE Asmanex TWO puffs daily --START Pulmicort nebulizer ONCE a day --CONTINUE Duonebs as needed. --Continue regular aerobic exercise daily  Chronic hypoxemic respiratory failure --Wear 3L with activity and sleep --Goal SpO2 >88%  Follow-up with me in 4-6 weeks

## 2021-08-05 ENCOUNTER — Telehealth: Payer: Self-pay | Admitting: Pulmonary Disease

## 2021-08-05 NOTE — Telephone Encounter (Signed)
Called and spoke with patient who is requesting a prescription for portable oxygen so that he can buy one from [facility]. He has everything through the [place] but they are giving him a hard time so he has already reached out to [facility] and they just need a prescription. Advised him I would touch base with him on Monday.

## 2021-08-08 NOTE — Telephone Encounter (Signed)
Dr. [name] this patient is requesting a prescription to buy a POC would you like to come sign it or for [place] to ask the provider of the day if they are ok signing it for you?

## 2021-08-09 NOTE — Telephone Encounter (Signed)
Ok to have provider of the day sign. Is there POC ambulatory O2 on file? Will need to come in for this if not.

## 2021-08-10 NOTE — Telephone Encounter (Signed)
Called and spoke with pt to see if he had ever been walked at the office with POC before and he stated that he had not. Stated to pt that our policy for qualifying walks for POCs have changed as we now require an OV to be scheduled to have the walk performed and that way we can get the order taken care of during the [place]. Pt verbalized understanding. OV scheduled for pt Friday, 6/23 with [facility]. Nothing further needed.

## 2021-08-12 ENCOUNTER — Ambulatory Visit (INDEPENDENT_AMBULATORY_CARE_PROVIDER_SITE_OTHER): Payer: No Typology Code available for payment source | Admitting: Nurse Practitioner

## 2021-08-12 ENCOUNTER — Encounter: Payer: Self-pay | Admitting: Nurse Practitioner

## 2021-08-12 VITALS — BP 122/62 | HR 94 | Temp 98.5°F | Ht 68.0 in | Wt 212.0 lb

## 2021-08-12 DIAGNOSIS — J449 Chronic obstructive pulmonary disease, unspecified: Secondary | ICD-10-CM

## 2021-08-12 DIAGNOSIS — J9611 Chronic respiratory failure with hypoxia: Secondary | ICD-10-CM | POA: Diagnosis not present

## 2021-08-12 MED ORDER — BUDESONIDE 0.5 MG/2ML IN SUSP: 0.5000 mg | Freq: Every day | RESPIRATORY_TRACT | 2 refills | Status: DC

## 2021-08-12 NOTE — Assessment & Plan Note (Signed)
End stage COPD, exacerbating recently and treated with multiple steroid courses. Overall, improved today but not entirely at his baseline. Suspect this is due to deconditioning after rapid weight loss thyrotoxicosis. He also never started on Pulmicort nebs so we will re-order these today; he requested to have a printed prescription so he could take it to the [facility] to fill it. Continue current inhaler regimen and PRN albuterol/duonebs. Continue daliresp. He may require low dose daily prednisone but would prefer to try him on the pulmicort nebs first.   Patient Instructions  Continue Stiolto 2 puffs daily  Continue Asmanex 2 puffs daily. Brush tongue and rinse mouth afterwards Continue Albuterol inhaler 2 puffs or duoneb 3 mL neb every 6 hours as needed for shortness of breath or wheezing. Notify if symptoms persist despite rescue inhaler/neb use. Use neb twice daily until symptoms improve  Continue omeprazole 20 mg daily  Continue daliresp 500 mcg daily  Continue Mucinex 600 mg Twice daily for chest congestion  Continue supplemental oyxgen 2-3 lpm for oxygen saturation goal >88-90%   Start pulmicort neb 2 mL once daily via nebulizer machine  Follow up in one week with Dr. Everardo All (1st)  or Katie Weslie Pretlow,NP (2nd). If symptoms do not improve or worsen, please contact office for sooner follow up or seek emergency care.

## 2021-08-12 NOTE — Assessment & Plan Note (Addendum)
POC qualification walk today; unable to maintain sats >88% on even 4 lpm pulsed. Advised that at this point, his lungs are not strong enough to be able to tolerate the pulsed oxygen and he would have to continue on his 3 lpm continuous supplemental O2. Monitor for goal >88-90%. Encouraged to work on strength and conditioning, as tolerated. If his breathing and strength improve, we can re-evaluate in the future; although, we did discuss that this may never be an option dependent on the progression of his disease.

## 2021-09-01 ENCOUNTER — Encounter: Payer: Self-pay | Admitting: Pulmonary Disease

## 2021-09-01 ENCOUNTER — Ambulatory Visit (INDEPENDENT_AMBULATORY_CARE_PROVIDER_SITE_OTHER): Payer: No Typology Code available for payment source | Admitting: Pulmonary Disease

## 2021-09-01 VITALS — BP 116/64 | HR 71 | Temp 98.1°F | Ht 68.0 in | Wt 211.8 lb

## 2021-09-01 DIAGNOSIS — J9611 Chronic respiratory failure with hypoxia: Secondary | ICD-10-CM

## 2021-09-01 DIAGNOSIS — J449 Chronic obstructive pulmonary disease, unspecified: Secondary | ICD-10-CM | POA: Diagnosis not present

## 2021-09-01 NOTE — Patient Instructions (Addendum)
COPD, very severe (FEV 29% in 2016) Emphysema GOLD class D --CONTINUE Stiolto 2.5/2.5 TWO puffs ONCE a day --CONTINUE Asmanex TWO puffs daily --CONTINUE Pulmicort nebulizer ONCE a day. When sick, ok to take twice a day --CONTINUE Albuterol as needed. --Continue regular aerobic exercise daily  Chronic hypoxemic respiratory failure - improved to baseline 3L --Wear oxygen 3L with activity and sleep --Goal SpO2 >88%  Goals of Care --Discussed CPR and mechanical ventilation. Encourage to discuss with wife and family. He would not want to be on long term life support but may want a trial first.  Follow-up with me in 3 months
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
## 2021-12-05 ENCOUNTER — Encounter: Payer: Self-pay | Admitting: Pulmonary Disease

## 2021-12-05 ENCOUNTER — Ambulatory Visit (INDEPENDENT_AMBULATORY_CARE_PROVIDER_SITE_OTHER): Payer: No Typology Code available for payment source | Admitting: Pulmonary Disease

## 2021-12-05 VITALS — BP 110/62 | HR 79 | Ht 68.0 in | Wt 221.2 lb

## 2021-12-05 DIAGNOSIS — J449 Chronic obstructive pulmonary disease, unspecified: Secondary | ICD-10-CM | POA: Diagnosis not present

## 2021-12-05 DIAGNOSIS — J9611 Chronic respiratory failure with hypoxia: Secondary | ICD-10-CM

## 2021-12-05 DIAGNOSIS — J441 Chronic obstructive pulmonary disease with (acute) exacerbation: Secondary | ICD-10-CM

## 2021-12-05 MED ORDER — PREDNISONE 10 MG PO TABS: 30.0000 mg | ORAL_TABLET | Freq: Every day | ORAL | 0 refills | Status: AC
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
## 2021-12-05 NOTE — Patient Instructions (Addendum)
COPD exacerbation Prednisone 30 mg daily  COPD, very severe (FEV 29% in 2016) Emphysema GOLD class D --CONTINUE Stiolto 2.5/2.5 TWO puffs ONCE a day --CONTINUE Asmanex TWO puffs daily --CONTINUE Pulmicort nebulizer ONCE a day. When sick, ok to take twice a day --CONTINUE Albuterol as needed. --Continue regular aerobic exercise daily --Discussed vaccinations including influenza (updated), RSV (ok to get once improved), COVID (recommend), pneumococcal (due at 65)  Chronic hypoxemic respiratory failure - baseline 3L O2 --Wear oxygen 3L with activity and sleep --Goal SpO2 >88%  Follow-up with me in 4 months

## 2022-04-10 ENCOUNTER — Ambulatory Visit (INDEPENDENT_AMBULATORY_CARE_PROVIDER_SITE_OTHER): Payer: No Typology Code available for payment source | Admitting: Pulmonary Disease

## 2022-04-10 ENCOUNTER — Encounter (HOSPITAL_BASED_OUTPATIENT_CLINIC_OR_DEPARTMENT_OTHER): Payer: Self-pay | Admitting: Pulmonary Disease

## 2022-04-10 VITALS — BP 142/80 | HR 74 | Ht 68.0 in | Wt 247.0 lb

## 2022-04-10 DIAGNOSIS — J9611 Chronic respiratory failure with hypoxia: Secondary | ICD-10-CM | POA: Diagnosis not present

## 2022-04-10 DIAGNOSIS — J449 Chronic obstructive pulmonary disease, unspecified: Secondary | ICD-10-CM | POA: Diagnosis not present

## 2022-04-10 MED ORDER — STIOLTO RESPIMAT 2.5-2.5 MCG/ACT IN AERS: 2.0000 | INHALATION_SPRAY | Freq: Every day | RESPIRATORY_TRACT | 11 refills | Status: DC

## 2022-04-10 MED ORDER — BUDESONIDE 0.5 MG/2ML IN SUSP: 0.5000 mg | Freq: Every day | RESPIRATORY_TRACT | 2 refills | Status: DC

## 2022-04-10 MED ORDER — PREDNISONE 20 MG PO TABS: 40.0000 mg | ORAL_TABLET | Freq: Every day | ORAL | 0 refills | Status: AC

## 2022-04-10 MED ORDER — AZITHROMYCIN 250 MG PO TABS: ORAL_TABLET | ORAL | 0 refills | Status: DC

## 2022-04-10 MED ORDER — ALBUTEROL SULFATE (2.5 MG/3ML) 0.083% IN NEBU: 2.5000 mg | INHALATION_SOLUTION | Freq: Four times a day (QID) | RESPIRATORY_TRACT | 3 refills | Status: DC | PRN

## 2022-04-10 MED ORDER — ASMANEX (120 METERED DOSES) 220 MCG/ACT IN AEPB: 2.0000 | INHALATION_SPRAY | Freq: Every day | RESPIRATORY_TRACT | 11 refills | Status: DC

## 2022-04-10 MED ORDER — ROFLUMILAST 500 MCG PO TABS: 500.0000 ug | ORAL_TABLET | Freq: Every day | ORAL | 11 refills | Status: DC

## 2022-04-10 NOTE — Patient Instructions (Addendum)
COPD exacerbation COPD, very severe (FEV 29% in 2016) Emphysema GOLD class D --START Prednisone and azithromycin x 5 days --CONTINUE Stiolto  2.5/2.5 TWO puffs ONCE a day --CONTINUE Asmanex 220 mcg TWO puffs daily --CONTINUE Pulmicort nebulizer ONCE a day. When sick, ok to take twice a day --CONTINUE Daliresp daily --CONTINUE Albuterol inhaler or nebulizer as needed. --Continue regular aerobic exercise daily  Chronic hypoxemic respiratory failure - baseline 3L O2 --Wear oxygen 3L with activity and sleep --Goal SpO2 >88%  Follow-up with me in 3 months
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
## 2022-04-12 ENCOUNTER — Encounter (HOSPITAL_BASED_OUTPATIENT_CLINIC_OR_DEPARTMENT_OTHER): Payer: Self-pay | Admitting: Pulmonary Disease

## 2022-06-23 ENCOUNTER — Telehealth: Payer: Self-pay | Admitting: Pulmonary Disease

## 2022-06-23 MED ORDER — STIOLTO RESPIMAT 2.5-2.5 MCG/ACT IN AERS: 2.0000 | INHALATION_SPRAY | Freq: Every day | RESPIRATORY_TRACT | 11 refills | Status: DC

## 2022-06-23 NOTE — Telephone Encounter (Signed)
Called and spoke with patient. Patient stated he needed a refill on stiolto sent to the [place]. Rx has been sent to the pharmacy.   Nothing further needed.

## 2022-06-23 NOTE — Telephone Encounter (Signed)
Patient called to request a script for the patient's Tiotropium Bromide-Olodaterol (STIOLTO RESPIMAT) 2.5-2.5 MCG/ACT AERS .  He stated that the [place] said he would have to get a new script.  Please advise and call patient to discuss at [phone]

## 2022-07-12 ENCOUNTER — Ambulatory Visit (HOSPITAL_BASED_OUTPATIENT_CLINIC_OR_DEPARTMENT_OTHER): Payer: No Typology Code available for payment source | Admitting: Pulmonary Disease

## 2022-07-18 ENCOUNTER — Telehealth: Payer: Self-pay | Admitting: Pulmonary Disease

## 2022-07-18 MED ORDER — STIOLTO RESPIMAT 2.5-2.5 MCG/ACT IN AERS: 2.0000 | INHALATION_SPRAY | Freq: Every day | RESPIRATORY_TRACT | 11 refills | Status: DC

## 2022-07-18 NOTE — Telephone Encounter (Signed)
Called and spoke with patient. Patient verified pharmacy. Rx has been sent to the [place].   Nothing further needed.

## 2022-07-18 NOTE — Telephone Encounter (Signed)
[facility] states [name] does not have Stiolto refill. Patient phone number is [phone].

## 2022-08-07 ENCOUNTER — Telehealth: Payer: Self-pay | Admitting: Pulmonary Disease

## 2022-08-07 MED ORDER — STIOLTO RESPIMAT 2.5-2.5 MCG/ACT IN AERS: 2.0000 | INHALATION_SPRAY | Freq: Every day | RESPIRATORY_TRACT | 5 refills | Status: DC

## 2022-08-07 NOTE — Telephone Encounter (Signed)
Patient states [facility] does not have RX for [employer] inhaler. Patient phone number is [phone].

## 2022-08-07 NOTE — Telephone Encounter (Signed)
Please have ready 1 month sample of Stiolto for patient to pick tomorrow at [facility] office

## 2022-08-08 ENCOUNTER — Other Ambulatory Visit (HOSPITAL_BASED_OUTPATIENT_CLINIC_OR_DEPARTMENT_OTHER): Payer: Self-pay

## 2022-08-08 MED ORDER — STIOLTO RESPIMAT 2.5-2.5 MCG/ACT IN AERS: 2.0000 | INHALATION_SPRAY | Freq: Every day | RESPIRATORY_TRACT | 0 refills | Status: DC

## 2022-09-22 ENCOUNTER — Telehealth: Payer: Self-pay | Admitting: Pulmonary Disease

## 2022-09-22 MED ORDER — ALBUTEROL SULFATE (2.5 MG/3ML) 0.083% IN NEBU: 2.5000 mg | INHALATION_SOLUTION | Freq: Four times a day (QID) | RESPIRATORY_TRACT | 5 refills | Status: AC | PRN

## 2022-09-22 MED ORDER — ALBUTEROL SULFATE HFA 108 (90 BASE) MCG/ACT IN AERS: 2.0000 | INHALATION_SPRAY | Freq: Four times a day (QID) | RESPIRATORY_TRACT | 5 refills | Status: DC | PRN

## 2022-09-22 NOTE — Telephone Encounter (Signed)
Albuterol HFA and nebulizer refilled to preferred pharmacy.  Please schedule for follow-up in September

## 2022-11-13 ENCOUNTER — Ambulatory Visit (HOSPITAL_BASED_OUTPATIENT_CLINIC_OR_DEPARTMENT_OTHER): Payer: No Typology Code available for payment source | Admitting: Pulmonary Disease

## 2022-12-13 ENCOUNTER — Telehealth: Payer: Self-pay | Admitting: Nurse Practitioner

## 2022-12-13 MED ORDER — PREDNISONE 20 MG PO TABS: 40.0000 mg | ORAL_TABLET | Freq: Every day | ORAL | 0 refills | Status: DC

## 2022-12-13 NOTE — Telephone Encounter (Signed)
I called and spoke with the pt and notified of response per Katie  Pt verbalized understanding  Nothing further needed

## 2022-12-13 NOTE — Telephone Encounter (Signed)
I called and spoke with the pt  He is c/o increased SOB, wheezing, cough with clear sputum x 1 wk  He denies fevers, aches  He has appt 01/01/23 and there is nothing sooner to offer  He is taking mucinex, stiolto, pulmicort nebs, albuterol, zyrtec, daliresp  Please advise, thanks!  Allergies  Allergen Reactions   Taxol [Paclitaxel] Shortness Of Breath    Severe facial redness, nauseated.   Hydrocodone Nausea Only   Biafine [Wound Dressings]     Patient reports skin redness

## 2022-12-13 NOTE — Telephone Encounter (Signed)
PT calling. States he has a COPD flair up. Has lung cancer and portion of lung gone. States Ms. [name] usually works him in. Please call to advise. Thanks.

## 2022-12-13 NOTE — Telephone Encounter (Signed)
Out of the window for viral testing. Possible AECOPD. Please send prednisone 40 mg daily for 5 days. Take in AM with food. Use mucinex DM over the counter for cough/congestion. Would hold off on abx right now since sputum is clear and no other infectious symptoms. He should use his albuterol nebs 3 times a day until symptoms improve. Continue other maintenance respiratory medications. ED precautions. If no improvement over the next few days, advise him to call back. Thanks.

## 2023-01-01 ENCOUNTER — Ambulatory Visit (INDEPENDENT_AMBULATORY_CARE_PROVIDER_SITE_OTHER): Payer: No Typology Code available for payment source | Admitting: Nurse Practitioner

## 2023-01-01 ENCOUNTER — Ambulatory Visit: Payer: No Typology Code available for payment source

## 2023-01-01 ENCOUNTER — Encounter: Payer: Self-pay | Admitting: Nurse Practitioner

## 2023-01-01 VITALS — BP 128/68 | HR 108 | Ht 68.0 in | Wt 274.4 lb

## 2023-01-01 DIAGNOSIS — J449 Chronic obstructive pulmonary disease, unspecified: Secondary | ICD-10-CM | POA: Diagnosis not present

## 2023-01-01 DIAGNOSIS — H6692 Otitis media, unspecified, left ear: Secondary | ICD-10-CM | POA: Insufficient documentation

## 2023-01-01 DIAGNOSIS — J441 Chronic obstructive pulmonary disease with (acute) exacerbation: Secondary | ICD-10-CM | POA: Diagnosis not present

## 2023-01-01 DIAGNOSIS — J9611 Chronic respiratory failure with hypoxia: Secondary | ICD-10-CM | POA: Diagnosis not present

## 2023-01-01 DIAGNOSIS — J310 Chronic rhinitis: Secondary | ICD-10-CM

## 2023-01-01 DIAGNOSIS — H7292 Unspecified perforation of tympanic membrane, left ear: Secondary | ICD-10-CM

## 2023-01-01 DIAGNOSIS — J329 Chronic sinusitis, unspecified: Secondary | ICD-10-CM | POA: Insufficient documentation

## 2023-01-01 DIAGNOSIS — C3492 Malignant neoplasm of unspecified part of left bronchus or lung: Secondary | ICD-10-CM

## 2023-01-01 MED ORDER — PREDNISONE 10 MG PO TABS: ORAL_TABLET | ORAL | 0 refills | Status: DC

## 2023-01-01 MED ORDER — FLUTICASONE PROPIONATE 50 MCG/ACT NA SUSP: 2.0000 | Freq: Every day | NASAL | 2 refills | Status: AC

## 2023-01-01 MED ORDER — DOXYCYCLINE HYCLATE 100 MG PO TABS: 100.0000 mg | ORAL_TABLET | Freq: Two times a day (BID) | ORAL | 0 refills | Status: AC

## 2023-01-01 NOTE — Patient Instructions (Addendum)
Continue Stiolto 2 puffs daily Continue Asmanex to 2 puffs daily. Brush tongue and rinse mouth afterwards.  Continue budesonide 2 mL neb 1-2 times a day. Brush tongue and rinse mouth afterwards  Continue Albuterol inhaler 2 puffs or 3 mL neb every 6 hours as needed for shortness of breath or wheezing. Notify if symptoms persist despite rescue inhaler/neb use. Use albuterol neb three times a day until symptoms improve and follow with flutter valve 10 times Continue Zyrtec 1 tab daily Continue guaifenesin [phone] mg Twice daily for cough/congestion Continue daliresp 1 tab daily Continue supplemental oxygen 2-3 lpm for goal >88-90%  Prednisone taper. 4 tabs for 3 days, then 3 tabs for 3 days, 2 tabs for 3 days, then 1 tab for 3 days, then stop. Take in AM with food  Doxycycline 1 tab Twice daily for 7 days. Take in AM with food. Wear sunscreen when outside as this increases your risk for sunburns  Flonase nasal spray 2 sprays each nostril daily for nasal congestion   Chest x ray today did not show any evidence of pneumonia   Follow up in 2-3 weeks with Dr. Everardo All (1sT) or Katie Minie Roadcap,NP. If symptoms do not improve or worsen, please contact office for sooner follow up or seek emergency care.

## 2023-01-01 NOTE — Addendum Note (Signed)
Addended by: Noemi Chapel on: 01/01/2023 11:24 AM   Modules accepted: Level of Service
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
## 2023-02-15 ENCOUNTER — Telehealth: Payer: Self-pay | Admitting: Pulmonary Disease

## 2023-02-15 NOTE — Telephone Encounter (Signed)
[facility] Albuterol, Stiolto and asthma-max. Patient would like a call when the prescriptions have been filled by the [place] [phone]

## 2023-02-16 MED ORDER — ASMANEX (120 METERED DOSES) 220 MCG/ACT IN AEPB: 2.0000 | INHALATION_SPRAY | Freq: Every day | RESPIRATORY_TRACT | 11 refills | Status: DC

## 2023-02-16 MED ORDER — STIOLTO RESPIMAT 2.5-2.5 MCG/ACT IN AERS: 2.0000 | INHALATION_SPRAY | Freq: Every day | RESPIRATORY_TRACT | 11 refills | Status: DC

## 2023-02-16 MED ORDER — ALBUTEROL SULFATE HFA 108 (90 BASE) MCG/ACT IN AERS: 2.0000 | INHALATION_SPRAY | Freq: Four times a day (QID) | RESPIRATORY_TRACT | 5 refills | Status: DC | PRN

## 2023-02-16 NOTE — Telephone Encounter (Signed)
Patient called regarding refills for albuterol, Stiolto and Asmanex.  Advised patient to schedule routine follow-up with me in the Spring (March/April 2025) after he is seen in his next appointment with NP [name] in January.

## 2023-02-28 ENCOUNTER — Ambulatory Visit: Payer: No Typology Code available for payment source | Admitting: Nurse Practitioner
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
## 2023-04-06 ENCOUNTER — Ambulatory Visit: Payer: No Typology Code available for payment source | Attending: Family Medicine | Admitting: Speech Pathology

## 2023-04-06 ENCOUNTER — Encounter: Payer: Self-pay | Admitting: Speech Pathology

## 2023-04-06 ENCOUNTER — Other Ambulatory Visit (HOSPITAL_BASED_OUTPATIENT_CLINIC_OR_DEPARTMENT_OTHER): Payer: Self-pay | Admitting: Pulmonary Disease

## 2023-04-06 ENCOUNTER — Telehealth: Payer: Self-pay | Admitting: Pulmonary Disease

## 2023-04-06 DIAGNOSIS — R1310 Dysphagia, unspecified: Secondary | ICD-10-CM | POA: Insufficient documentation

## 2023-04-06 MED ORDER — ROFLUMILAST 500 MCG PO TABS: 500.0000 ug | ORAL_TABLET | Freq: Every day | ORAL | 6 refills | Status: DC

## 2023-04-06 NOTE — Addendum Note (Signed)
Addended by: Gay Filler T on: 04/06/2023 01:17 PM   Modules accepted: Orders

## 2023-04-06 NOTE — Telephone Encounter (Signed)
Bonita Quin states patient needs refill for Roflumilast. Pharmacy is [facility]. Phone number is [phone].

## 2023-04-06 NOTE — Telephone Encounter (Signed)
Order sent.

## 2023-04-06 NOTE — Patient Instructions (Addendum)
   ACID REFLUX PRECAUTIONS   Acid reflux is a disorder where acid from your stomach is abnormally spilled over onto your voice box after eating, during sleep, or even during singing. Acid reflux causes irritation and inflammation to your vocal folds and can cause the sensation of something being "stuck" in your throat.   CHANGE EATING HABITS   Avoid "trigger" foods. Certain foods and drinks can trigger acid reflux.  1. Caffeine- in coffee, tea, chocolate, sodas  2. Carbonated beverages  3. Mint and menthol  4. Fatty/fried foods  5. Citrus fruits  6. Tomato products  7. Spicy foods  8. Alcohol    CHANGING LIFESTYLE HABITS   Drink 8 glasses of water per day (64oz)    Stop smoking   Avoid clearing your throat   Allow 3 hours between last big meal and going to bed at night   Keep yourself upright for one hour after you eat   Elevate the head of your bed using 6-inch blocks under the head of the bed or a bed wedge between the box spring and the mattress.   Eat small meals throughout the day rather than 3 big meals   Eat slowly   Wear loose clothing   TAKING MEDICATION   If prescribed one time a day, take 15-30 minutes before breakfast   If prescribed two times a day, take 15-30 minutes before breakfast and 15- 30 minutes before dinner    STRATEGIES:   + soft, moist foods will likely digest easier + small bites, fully chew your food -- chew longer than you think you need to + collect all food in mouth into a ball, swallow hard and fast  + swallow again if it feels like it didn't go all the way down  + take you time while eating + limit distractions while eating

## 2023-04-06 NOTE — Therapy (Deleted)
[facility] [address] [place], [place], [address] Phone: [phone]   Fax:  [phone]  Patient Details  Name: Vincent Sutton MRN: [id number] Date of Birth: 20-May-1960 Referring Provider:  Albina Billet, DO  Encounter Date: 04/06/2023   Roylene Reason, Student-SLP 04/06/2023, 9:32 AM  [facility] [address] [place], [place], [address] Phone: [phone]   Fax:  [phone]
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
## 2023-04-17 ENCOUNTER — Ambulatory Visit: Payer: No Typology Code available for payment source

## 2023-04-17 DIAGNOSIS — R1310 Dysphagia, unspecified: Secondary | ICD-10-CM | POA: Diagnosis not present

## 2023-04-17 NOTE — Patient Instructions (Addendum)
 Swallow technique / swallow form Take a spoonful and put it in your mouth Keep your mouth closed and lips together Try not to move the food/drink around in your mouth When you are ready, swallow as fast and as hard as you can Try to swallow it all in one swallow Once you have swallowed, clear your throat gently if needed Then, swallow as fast and as hard as you can Continue to use a gentle throat clear if needed followed by a hard, fast swallow until you think the food/drink is gone  Home program: Exercises to do at home Use the techniques you have been doing in therapy Look at the swallowing technique / swallow form. Review it before you start working and use it was your reminder.  Remember to swallow fast and hard.  Work on level that your therapist instructs This week, work on level  _100 hard swallows with_teaspoon sips of water__ If 3 out of 5 swallows does not go well STOP If you run into trouble, are scared, or are very unsure what to do, STOP You can reach your therapist at [email or web address] or wait until your next session

## 2023-04-24 ENCOUNTER — Ambulatory Visit: Payer: No Typology Code available for payment source | Attending: Family Medicine

## 2023-04-24 DIAGNOSIS — R1310 Dysphagia, unspecified: Secondary | ICD-10-CM | POA: Diagnosis present
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
## 2023-05-01 ENCOUNTER — Ambulatory Visit: Payer: No Typology Code available for payment source | Admitting: Speech Pathology

## 2023-05-01 DIAGNOSIS — R1310 Dysphagia, unspecified: Secondary | ICD-10-CM
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
## 2023-05-08 ENCOUNTER — Ambulatory Visit: Payer: No Typology Code available for payment source | Admitting: Speech Pathology

## 2023-05-08 DIAGNOSIS — R1310 Dysphagia, unspecified: Secondary | ICD-10-CM
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
## 2023-05-12 ENCOUNTER — Ambulatory Visit (INDEPENDENT_AMBULATORY_CARE_PROVIDER_SITE_OTHER)

## 2023-05-12 ENCOUNTER — Other Ambulatory Visit: Payer: Self-pay

## 2023-05-12 ENCOUNTER — Ambulatory Visit
Admission: EM | Admit: 2023-05-12 | Discharge: 2023-05-12 | Disposition: A | Discharge status: Home / Self Care | Attending: Internal Medicine | Admitting: Internal Medicine

## 2023-05-12 ENCOUNTER — Encounter: Payer: Self-pay | Admitting: Emergency Medicine

## 2023-05-12 DIAGNOSIS — S92354A Nondisplaced fracture of fifth metatarsal bone, right foot, initial encounter for closed fracture: Secondary | ICD-10-CM

## 2023-05-12 MED ORDER — OXYCODONE-ACETAMINOPHEN 5-325 MG PO TABS: 1.0000 | ORAL_TABLET | Freq: Four times a day (QID) | ORAL | 0 refills | Status: DC | PRN

## 2023-05-12 NOTE — ED Triage Notes (Signed)
 Pt presents with right ankle/foot pain that is the result of a fall down the steps at home earlier today. Pt cannot stand on the foot. Pt does not recall losing conscience.
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
## 2023-05-12 NOTE — Discharge Instructions (Signed)
 You have broken your foot.  Follow-up with orthopedist.  Elevate and apply ice.  Do not bear weight until otherwise advised by orthopedist.  I have prescribed you pain medication but please use this sparingly.  It does contain Tylenol so do not take any additional Tylenol.

## 2023-05-15 ENCOUNTER — Ambulatory Visit: Payer: No Typology Code available for payment source | Admitting: Speech Pathology

## 2023-05-15 DIAGNOSIS — R1310 Dysphagia, unspecified: Secondary | ICD-10-CM
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
## 2023-05-22 ENCOUNTER — Ambulatory Visit: Payer: No Typology Code available for payment source | Attending: Family Medicine

## 2023-05-22 DIAGNOSIS — R1310 Dysphagia, unspecified: Secondary | ICD-10-CM | POA: Diagnosis present

## 2023-05-22 DIAGNOSIS — R1312 Dysphagia, oropharyngeal phase: Secondary | ICD-10-CM | POA: Diagnosis not present

## 2023-05-22 NOTE — Patient Instructions (Addendum)
 SWALLOWING EXERCISES  Masako Swallow - swallow with your tongue sticking out - Stick tongue out and gently bite tongue with your teeth - Swallow, while holding your tongue with your teeth - Repeat 20 times, 2-3 times a day  2.  CTAR - Chin Tuck Against Resistance  - Place towel, ball or pool noodle under your chin          - Exercise #1: Hold for 60 seconds; 2-3x per a day (3 minutes total) - Exercise #2: Pulse up and down (1 second hold); 20x; 2-3x a day     Add next session if warranted:   3. Shaker Exercise - head lift - Lie flat on your back in your bed or on a couch without pillows - Raise your head and look at your feet  - KEEP YOUR SHOULDERS DOWN - HOLD FOR 45 to 60 SECONDS, then lower your head back down - Repeat 3 times, 2-3 times a day  4. Mendelsohn Maneuver -  swallow as tight as you  for 5 seconds - Start to swallow, and keep your Adam's apple up by squeezing tight with the muscles of the throat - Hold the squeeze for 5-7 seconds and then relax - Repeat 20 times, 2-3 times a day
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
## 2023-06-06 ENCOUNTER — Ambulatory Visit: Payer: Self-pay

## 2023-06-06 ENCOUNTER — Telehealth: Payer: Self-pay

## 2023-06-06 NOTE — Telephone Encounter (Signed)
 Attempted to call patient, No answer, General message left to look at [facility] message sent.  Maude Sorrel CMA

## 2023-06-06 NOTE — Telephone Encounter (Signed)
 SLP intern contacted pt's preferred phone number in chart as pt did not attend scheduled ST session today. Left VM. Provided next upcoming ST date/time and requested call back to front office re: scheduling as needed.   Ray Caffey, MA CCC-SLP (SLP supervisor)
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
## 2023-06-13 ENCOUNTER — Ambulatory Visit: Payer: Self-pay

## 2023-06-13 DIAGNOSIS — R1312 Dysphagia, oropharyngeal phase: Secondary | ICD-10-CM | POA: Diagnosis not present

## 2023-06-13 DIAGNOSIS — R1310 Dysphagia, unspecified: Secondary | ICD-10-CM
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
## 2023-06-13 NOTE — Patient Instructions (Signed)
 SWALLOWING EXERCISES 100 Effortful Swallows - Squeeze hard with the muscles in your neck while you swallow your  saliva or a sip of water  - Repeat 20 times, 2-3 times a day, and use whenever you eat or drink   Masako Swallow - swallow with your tongue sticking out - Stick tongue out and gently bite tongue with your teeth - Swallow, while holding your tongue with your teeth - Repeat 20 times, 2-3 times a day    CTAR - Chin Tuck Against Resistance              - Place towel, ball or pool noodle under your chin             - Hold for 60 seconds 2-3x  a day             - Pulse up and down 20x 2-3x a day   [employer] "Half swallow" -  swallow as tight as you  for 5 seconds - Start to swallow, and keep your Adam's apple up by squeezing tight with the muscles of the throat - Hold the squeeze for 5-7 seconds and then relax - Repeat 20 times, 2-3 times a day

## 2023-06-19 ENCOUNTER — Ambulatory Visit: Payer: Self-pay | Admitting: Speech Pathology

## 2023-06-19 DIAGNOSIS — R1312 Dysphagia, oropharyngeal phase: Secondary | ICD-10-CM | POA: Diagnosis not present

## 2023-06-19 DIAGNOSIS — R1310 Dysphagia, unspecified: Secondary | ICD-10-CM
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
## 2023-06-21 ENCOUNTER — Other Ambulatory Visit (HOSPITAL_COMMUNITY): Payer: Self-pay | Admitting: Family Medicine

## 2023-06-21 DIAGNOSIS — R1310 Dysphagia, unspecified: Secondary | ICD-10-CM

## 2023-07-06 ENCOUNTER — Ambulatory Visit (HOSPITAL_COMMUNITY)
Admission: RE | Admit: 2023-07-06 | Discharge: 2023-07-06 | Disposition: A | Discharge status: Home / Self Care | Source: Ambulatory Visit | Attending: Family Medicine | Admitting: Family Medicine

## 2023-07-06 ENCOUNTER — Ambulatory Visit (HOSPITAL_COMMUNITY)
Admission: RE | Admit: 2023-07-06 | Discharge: 2023-07-06 | Disposition: A | Discharge status: Home / Self Care | Source: Ambulatory Visit | Attending: *Deleted | Admitting: *Deleted

## 2023-07-06 DIAGNOSIS — R1310 Dysphagia, unspecified: Secondary | ICD-10-CM | POA: Insufficient documentation

## 2023-07-06 DIAGNOSIS — R09A2 Foreign body sensation, throat: Secondary | ICD-10-CM | POA: Insufficient documentation

## 2023-07-06 NOTE — Progress Notes (Signed)
 Modified Barium Swallow Study  Patient Details  Name: Vincent Sutton MRN: [id number] Date of Birth: 03-09-1960  Today's Date: 07/06/2023  Modified Barium Swallow completed.  Full report located under Chart Review in the Imaging Section.  History of Present Illness Pt is a 62yoM arriving for an OP MBS, referred by OP SLP. Pt has a prior hx of mild dysphagia, MBS on 03/09/23 showed laryngeal penetration with immediate ejection of thin liquids, no aspiration. Esophageal stasis.  Pt has undergone a course of dysphagia therapy. Persistent concerns are regurgitation and globus. Plan for GI referral.  Pt additionally with history of stage Ia squamous cell carcinoma of the left lung s/p chemoradiation and LUL lobectomy, hyperthyroid, GERD   Clinical Impression Pt presents with intact airway protection. Strength of function appears WNL. Pts swallowing pattern can be described as slightly abnormal, but functional. Pt reports he uses an effortful swallow as a strategy. Pt was not cued during exam. Pt initiates laryngeal elevation and glottic closure in a timely manner, as bolus pases angle of ramus. Hyoid excusion and epiglottic deflection occur with bolus sitting above closed PES with trace high penetration to vestibule and posterior surface of epiglottis. Then as hyoid moves forward, epiglottis fully inverts, PES opens. Pt sustains airway closure with a second and third piece of he same bolus, if large. There is no persistent penetrate and no aspiration. no residue and No signs of weakness. Pts primary complaint is globus and regurgitation with solids. Esopahgeal sweep with solids unrevealing due to poor quality of image due to body habitus. Image a little more clear with thin barium and tablet, but appears WNL (no radiologist present to confirm). Defer further recommendation to primary SLP. Factors that may increase risk of adverse event in presence of aspiration Roderick Civatte & Jessy Morocco 2021): Respiratory or GI  disease  Swallow Evaluation Recommendations Recommendations: PO diet PO Diet Recommendation: Regular;Thin liquids (Level 0) Liquid Administration via: Cup;Straw Medication Administration: Whole meds with liquid Supervision: Patient able to self-feed Swallowing strategies  : effortful swallow Postural changes: Position pt fully upright for meals Oral care recommendations: Pt independent with oral care      Wm Sahagun, Hardin Leys 07/06/2023,1:29 PM

## 2023-07-11 ENCOUNTER — Encounter: Payer: Self-pay | Admitting: Gastroenterology

## 2023-07-11 ENCOUNTER — Ambulatory Visit (INDEPENDENT_AMBULATORY_CARE_PROVIDER_SITE_OTHER): Admitting: Gastroenterology

## 2023-07-11 VITALS — BP 150/84 | HR 94 | Ht 68.0 in | Wt 270.0 lb

## 2023-07-11 DIAGNOSIS — C3492 Malignant neoplasm of unspecified part of left bronchus or lung: Secondary | ICD-10-CM

## 2023-07-11 DIAGNOSIS — J449 Chronic obstructive pulmonary disease, unspecified: Secondary | ICD-10-CM

## 2023-07-11 DIAGNOSIS — Z860100 Personal history of colon polyps, unspecified: Secondary | ICD-10-CM

## 2023-07-11 DIAGNOSIS — Z87891 Personal history of nicotine dependence: Secondary | ICD-10-CM

## 2023-07-11 DIAGNOSIS — R1110 Vomiting, unspecified: Secondary | ICD-10-CM

## 2023-07-11 DIAGNOSIS — R09A2 Foreign body sensation, throat: Secondary | ICD-10-CM

## 2023-07-11 DIAGNOSIS — R1310 Dysphagia, unspecified: Secondary | ICD-10-CM | POA: Diagnosis not present

## 2023-07-11 DIAGNOSIS — K219 Gastro-esophageal reflux disease without esophagitis: Secondary | ICD-10-CM

## 2023-07-11 DIAGNOSIS — Z9981 Dependence on supplemental oxygen: Secondary | ICD-10-CM

## 2023-07-11 DIAGNOSIS — Z85118 Personal history of other malignant neoplasm of bronchus and lung: Secondary | ICD-10-CM

## 2023-07-11 MED ORDER — NA SULFATE-K SULFATE-MG SULF 17.5-3.13-1.6 GM/177ML PO SOLN: 1.0000 | ORAL | 0 refills | Status: AC

## 2023-07-11 NOTE — Patient Instructions (Signed)
 You have been scheduled for an endoscopy and colonoscopy. Please follow the written instructions given to you at your visit today.  If you use inhalers (even only as needed), please bring them with you on the day of your procedure.  DO NOT TAKE 7 DAYS PRIOR TO TEST- Trulicity (dulaglutide) Ozempic, Wegovy (semaglutide) Mounjaro (tirzepatide) Bydureon Bcise (exanatide extended release)  DO NOT TAKE 1 DAY PRIOR TO YOUR TEST Rybelsus (semaglutide) Adlyxin (lixisenatide) Victoza (liraglutide) Byetta (exanatide) ___________________________________________________________________________  We will try and obtain your records from the [place] to review.  I appreciate the opportunity to care for you. Suzanna Erp, PA
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
## 2023-07-31 NOTE — Progress Notes (Signed)
 Agree with the assessment and plan as outlined by Boone Master, PA-C.

## 2023-08-22 ENCOUNTER — Ambulatory Visit: Payer: Self-pay

## 2023-08-22 NOTE — Telephone Encounter (Signed)
 FYI Only or Action Required?: Action required by provider: request for appointment.  Patient is followed in Pulmonology for unknown, last seen on 01/01/2023 by Malachy Comer GAILS, NP. Called Nurse Triage reporting Shortness of Breath. Symptoms began several weeks ago. Interventions attempted: OTC medications: multiple inhalers. Symptoms are: gradually worsening.  Triage Disposition: See PCP When Office is Open (Within 3 Days)  Patient/caregiver understands and will follow disposition?: Yes    Copied from CRM [id number]. Topic: General - Other >> Aug 22, 2023  5:31 PM [place] S wrote: Reason for CRM: need someone to call, patient has stage 4 copd and lung cancer, he needs to see someone. Reason for Disposition  [1] MODERATE longstanding difficulty breathing (e.g., speaks in phrases, SOB even at rest, pulse 100-120) AND [2] SAME as normal  Answer Assessment - Initial Assessment Questions 1. RESPIRATORY STATUS: Describe your breathing? (e.g., wheezing, shortness of breath, unable to speak, severe coughing)      Feels more short of breath than normal 2. ONSET: When did this breathing problem begin?      Last couple of weeks he has had a worsened time breathing 4. SEVERITY: How bad is your breathing? (e.g., mild, moderate, severe)    - MILD: No SOB at rest, mild SOB with walking, speaks normally in sentences, can lie down, no retractions, pulse < 100.    - MODERATE: SOB at rest, SOB with minimal exertion and prefers to sit, cannot lie down flat, speaks in phrases, mild retractions, audible wheezing, pulse 100-120.    - SEVERE: Very SOB at rest, speaks in single words, struggling to breathe, sitting hunched forward, retractions, pulse > 120      Mild 6. CARDIAC HISTORY: Do you have any history of heart disease? (e.g., heart attack, angina, bypass surgery, angioplasty)      No 7. LUNG HISTORY: Do you have any history of lung disease?  (e.g., pulmonary embolus, asthma, emphysema)     Stage 4  COPD 8. CAUSE: What do you think is causing the breathing problem?      Patient is uncertain 9. OTHER SYMPTOMS: Do you have any other symptoms? (e.g., dizziness, runny nose, cough, chest pain, fever)     Coughing up phlegm (greenish yellow) - denies fever, chest pain, runny nose 10. O2 SATURATION MONITOR:  Do you use an oxygen saturation monitor (pulse oximeter) at home? If Yes, ask: What is your reading (oxygen level) today? What is your usual oxygen saturation reading? (e.g., 95%)       3L home oxygen - patient states he doesn't increase it because he was told not to by Pulmonologist. Patient states his o2 is running around 97%. Sometimes it drops.  Protocols used: Breathing Difficulty-A-AH

## 2023-08-23 ENCOUNTER — Ambulatory Visit

## 2023-08-23 ENCOUNTER — Ambulatory Visit (INDEPENDENT_AMBULATORY_CARE_PROVIDER_SITE_OTHER): Payer: Self-pay | Admitting: Primary Care

## 2023-08-23 ENCOUNTER — Ambulatory Visit: Payer: Self-pay | Admitting: Primary Care

## 2023-08-23 ENCOUNTER — Encounter: Payer: Self-pay | Admitting: Primary Care

## 2023-08-23 VITALS — BP 142/92 | HR 84 | Temp 97.7°F | Ht 68.0 in | Wt 276.6 lb

## 2023-08-23 DIAGNOSIS — R0602 Shortness of breath: Secondary | ICD-10-CM

## 2023-08-23 DIAGNOSIS — J441 Chronic obstructive pulmonary disease with (acute) exacerbation: Secondary | ICD-10-CM

## 2023-08-23 DIAGNOSIS — J449 Chronic obstructive pulmonary disease, unspecified: Secondary | ICD-10-CM

## 2023-08-23 LAB — BASIC METABOLIC PANEL WITH GFR
BUN: 12 mg/dL (ref 6–23)
CO2: 39 meq/L — ABNORMAL HIGH (ref 19–32)
Calcium: 9.5 mg/dL (ref 8.4–10.5)
Chloride: 96 meq/L (ref 96–112)
Creatinine, Ser: 0.83 mg/dL (ref 0.40–1.50)
GFR: 93.39 mL/min (ref >60.00)
Glucose, Bld: 123 mg/dL — ABNORMAL HIGH (ref 70–99)
Potassium: 4.1 meq/L (ref 3.5–5.1)
Sodium: 138 meq/L (ref 135–145)

## 2023-08-23 LAB — CBC
HCT: 38.9 % — ABNORMAL LOW (ref 39.0–52.0)
Hemoglobin: 12.8 g/dL — ABNORMAL LOW (ref 13.0–17.0)
MCHC: 33 g/dL (ref 30.0–36.0)
MCV: 89.1 fl (ref 78.0–100.0)
Platelets: 401 10*3/uL — ABNORMAL HIGH (ref 150.0–400.0)
RBC: 4.37 Mil/uL (ref 4.22–5.81)
RDW: 15.9 % — ABNORMAL HIGH (ref 11.5–15.5)
WBC: 6.3 10*3/uL (ref 4.0–10.5)

## 2023-08-23 LAB — BRAIN NATRIURETIC PEPTIDE: Pro B Natriuretic peptide (BNP): 52 pg/mL (ref 0.0–100.0)

## 2023-08-23 MED ORDER — METHYLPREDNISOLONE ACETATE 80 MG/ML IJ SUSP
80.0000 mg | Freq: Once | INTRAMUSCULAR | Status: AC
  Administered 2023-08-23: 80 mg via INTRAMUSCULAR

## 2023-08-23 MED ORDER — ALBUTEROL SULFATE HFA 108 (90 BASE) MCG/ACT IN AERS: 2.0000 | INHALATION_SPRAY | Freq: Four times a day (QID) | RESPIRATORY_TRACT | 5 refills | Status: DC | PRN

## 2023-08-23 MED ORDER — PREDNISONE 10 MG PO TABS: ORAL_TABLET | ORAL | 0 refills | Status: DC

## 2023-08-23 MED ORDER — AMOXICILLIN-POT CLAVULANATE 875-125 MG PO TABS: 1.0000 | ORAL_TABLET | Freq: Two times a day (BID) | ORAL | 0 refills | Status: DC

## 2023-08-23 MED ORDER — STIOLTO RESPIMAT 2.5-2.5 MCG/ACT IN AERS: 2.0000 | INHALATION_SPRAY | Freq: Every day | RESPIRATORY_TRACT | 11 refills | Status: DC

## 2023-08-23 NOTE — Progress Notes (Signed)
 ATC X1. LMTCB
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
## 2023-08-23 NOTE — Patient Instructions (Addendum)
  VISIT SUMMARY: You came in today because of increased shortness of breath and a productive cough that started after your trip to the beach last week. You have severe COPD and are currently on several medications to manage your condition. Your symptoms suggest an acute exacerbation of COPD.   YOUR PLAN: -CHRONIC OBSTRUCTIVE PULMONARY DISEASE (COPD) WITH ACUTE EXACERBATION: COPD is a chronic lung disease that makes it hard to breathe. An acute exacerbation means your symptoms have suddenly worsened. To treat this, you will start on Augmentin  for a suspected bacterial infection and a prednisone  taper to reduce inflammation. You received a Depo-Medrol  injection today to help with inflammation. Continue using budesonide  twice daily as needed for wheezing, and your Stiolto and nebulized medication have been refilled.  -DIASTOLIC DYSFUNCTION: Diastolic dysfunction means your heart has trouble relaxing and filling with blood. We noted some swelling in your lower legs, so we will check your kidney function and electrolyte levels with a blood test.  Orders: Depomedrol 80mg  IM Augmentin  Prednisone  taper CXR and labs   Follow-up 7-10 days with Beth/ ok to double book on a day that I am not already and write ok'd by me in apt note

## 2023-08-23 NOTE — Telephone Encounter (Signed)
 FYI Pt scheduled for 08/23/2023 @ 9:30

## 2023-08-23 NOTE — Progress Notes (Signed)
 CXR showed no acute process, emphysema and scarring

## 2023-08-28 NOTE — Telephone Encounter (Signed)
 Relayed message, pt verbalized understanding and no concern
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
## 2023-09-03 ENCOUNTER — Ambulatory Visit (INDEPENDENT_AMBULATORY_CARE_PROVIDER_SITE_OTHER): Admitting: Primary Care

## 2023-09-03 ENCOUNTER — Encounter: Payer: Self-pay | Admitting: Primary Care

## 2023-09-03 VITALS — BP 134/72 | HR 87 | Temp 97.4°F | Ht 68.0 in | Wt 274.4 lb

## 2023-09-03 DIAGNOSIS — E669 Obesity, unspecified: Secondary | ICD-10-CM | POA: Diagnosis not present

## 2023-09-03 DIAGNOSIS — J9611 Chronic respiratory failure with hypoxia: Secondary | ICD-10-CM | POA: Diagnosis not present

## 2023-09-03 DIAGNOSIS — K219 Gastro-esophageal reflux disease without esophagitis: Secondary | ICD-10-CM | POA: Diagnosis not present

## 2023-09-03 DIAGNOSIS — J441 Chronic obstructive pulmonary disease with (acute) exacerbation: Secondary | ICD-10-CM

## 2023-09-03 DIAGNOSIS — J439 Emphysema, unspecified: Secondary | ICD-10-CM | POA: Diagnosis not present

## 2023-09-03 DIAGNOSIS — Z87891 Personal history of nicotine dependence: Secondary | ICD-10-CM

## 2023-09-03 DIAGNOSIS — Z6841 Body Mass Index (BMI) 40.0 and over, adult: Secondary | ICD-10-CM

## 2023-09-03 MED ORDER — PREDNISONE 10 MG PO TABS: ORAL_TABLET | ORAL | 0 refills | Status: DC

## 2023-09-03 MED ORDER — OMEPRAZOLE 20 MG PO CPDR: 20.0000 mg | DELAYED_RELEASE_CAPSULE | Freq: Two times a day (BID) | ORAL | 2 refills | Status: AC

## 2023-09-03 NOTE — Patient Instructions (Signed)
  VISIT SUMMARY: Today, you had a follow-up appointment to check on your COPD after a recent exacerbation. You reported feeling better since your last visit, with improvements in your shortness of breath and cough. We discussed your current medications and made some adjustments to better manage your symptoms. We also reviewed your other health concerns, including GERD, obesity, and your overactive thyroid.  YOUR PLAN: -CHRONIC OBSTRUCTIVE PULMONARY DISEASE (COPD) WITH EXACERBATION: COPD is a chronic lung disease that makes it hard to breathe. You recently had a flare-up with increased shortness of breath and a productive cough. We will renew your prednisone  for one more course to help with the mild wheezing. Continue using Stiolto and Asmanex  as prescribed, and use the budesonide  nebulizer as needed for wheezing. Keep taking guaifenesin  to manage mucus production.  -EMPHYSEMA: Emphysema is a type of COPD that involves damage to the air sacs in your lungs. Your chest x-ray showed emphysema and lung scarring but no signs of pneumonia or fluid buildup. Continue with your current COPD management plan.  -GASTROESOPHAGEAL REFLUX DISEASE (GERD): GERD is a condition where stomach acid frequently flows back into the tube connecting your mouth and stomach, causing discomfort. This can contribute to wheezing and breathing issues. If your reflux symptoms persist, increase your Prilosec to twice daily. We have updated your prescription to 60 capsules to accommodate this change.  -OBESITY: Obesity is a condition where you have an excessive amount of body fat. You are interested in losing weight, and we discussed the potential use of Zepbound, a medication that can help with weight loss, if your sleep apnea is confirmed. This medication can have side effects like nausea, vomiting, diarrhea, or constipation. Focus on eating small meals and increasing fluid. Avoid dairy and heavy/fried food.   INSTRUCTIONS: Please  follow up after your sleep study to discuss the results and potential weight loss options. Continue with your current medications and the adjustments we discussed today. If you experience any new or worsening symptoms, contact our office.  Follow-up 3 months with Landry NP

## 2023-09-19 ENCOUNTER — Encounter (HOSPITAL_COMMUNITY): Payer: Self-pay | Admitting: Gastroenterology

## 2023-09-19 ENCOUNTER — Telehealth: Payer: Self-pay

## 2023-09-19 ENCOUNTER — Telehealth: Payer: Self-pay | Admitting: Gastroenterology

## 2023-09-19 NOTE — Telephone Encounter (Signed)
 Hospital informed of clearance.

## 2023-09-19 NOTE — Telephone Encounter (Signed)
 Yes fine with this being done in hospital settings  Intermediate risk for prolonged mech ventilation and or post op pulmonary complications due to COPD with recent exacerbation and chronic respiratory failure, optimized from pulmonary standpoint

## 2023-09-19 NOTE — Telephone Encounter (Signed)
 Request for surgical clearance:     Endoscopy Procedure  What type of surgery is being performed?     colonoscopy  When is this surgery scheduled?     09/27/2023 at the hospital  What type of clearance is required ?   Pulmonary  Practice name and name of physician performing surgery?       Gastroenterology, Dr Glendia E. Stacia  What is your office phone and fax number?      Phone- [phone]  Fax- [phone]  Anesthesia type (None, local, MAC, general) ?       MAC   Please route your response to Vincent Sutton, CMA Sorry for the short notice on the clearance. Just made aware that the hospital is requesting it. Thanks for your help.

## 2023-09-19 NOTE — Progress Notes (Signed)
 Pre op call Marinda Plaza  [facility]- [facility]  Cardiologist-n/a Pulmonologist-Ellison MD  Chest xray- 08/23/23 [id number] Cath-n/a Stress-n/a ICD/PM- n/a GLP1-n/a Blood Thinner-n/a  YK:RNEI, Emphysema, Lung cancer,OSA. Patient last saw his pulm MD 09/03/23, has been dealing with resp bacterial infection but at that appt doing better. Patient reports has been close to 2 weeks without any symptoms (will be by time of procedure). He does wear 3L02 continously, reports breathing doing good recently, instructed to do breathing treatments am of procedure and bring portable tank.  Anesthesia Review- Yes- needs pulm clearance, GI office notified 7/30 via epic chat.

## 2023-09-19 NOTE — Telephone Encounter (Addendum)
 Procedure:Colonoscopy/Endoscopy Procedure date: 09/27/23 Procedure location: [facility] Arrival Time: 6:00 Spoke with the patient Y/N:   No, I left a detailed message on [phone] on 09/19/23 @ 10:31 am for the patient to return call   Yes, 10:43 am  Any prep concerns? No Has the patient obtained the prep from the pharmacy ? Yes Do you have a care partner and transportation: Yes Any additional concerns? Prep instructions resent to pt in Mychart

## 2023-09-26 NOTE — Anesthesia Preprocedure Evaluation (Signed)
 Anesthesia Evaluation  Patient identified by MRN, date of birth, ID band Patient awake    Reviewed: Allergy & Precautions, NPO status , Patient's Chart, lab work & pertinent test results  History of Anesthesia Complications Negative for: history of anesthetic complications  Airway Mallampati: III  TM Distance: >3 FB Neck ROM: Full    Dental  (+) Edentulous Upper, Dental Advisory Given   Pulmonary shortness of breath, with exertion and Long-Term Oxygen Therapy, sleep apnea and Continuous Positive Airway Pressure Ventilation , COPD,  COPD inhaler, former smoker   breath sounds clear to auscultation + decreased breath sounds      Cardiovascular negative cardio ROS  Rhythm:Regular Rate:Normal  Echo 2022  1. Left ventricular ejection fraction, by estimation, is 55 to 60%. The left ventricle has normal function. The left ventricle has no regional wall motion abnormalities. There is mild left ventricular hypertrophy. Left ventricular diastolic parameters are consistent with Grade I diastolic dysfunction (impaired relaxation).   2. Right ventricular systolic function is normal. The right ventricular size is normal. Tricuspid regurgitation signal is inadequate for assessing PA pressure.   3. Left atrial size was mildly dilated.   4. The mitral valve is normal in structure. No evidence of mitral valve regurgitation. No evidence of mitral stenosis.   5. The aortic valve is tricuspid. Aortic valve regurgitation is not visualized. Mild aortic valve sclerosis is present, with no evidence of aortic valve stenosis.   6. The inferior vena cava is normal in size with greater than 50% respiratory variability, suggesting right atrial pressure of 3 mmHg.    Neuro/Psych  PSYCHIATRIC DISORDERS Anxiety     negative neurological ROS     GI/Hepatic negative GI ROS, Neg liver ROS,,,  Endo/Other   Hyperthyroidism Class 3 obesity  Renal/GU negative Renal ROS      Musculoskeletal  (+) Arthritis ,    Abdominal  (+) + obese  Peds  Hematology negative hematology ROS (+)   Anesthesia Other Findings   Reproductive/Obstetrics negative OB ROS                              Anesthesia Physical Anesthesia Plan  ASA: 3  Anesthesia Plan: MAC   Post-op Pain Management: Minimal or no pain anticipated   Induction: Intravenous  PONV Risk Score and Plan: 2 and Treatment may vary due to age or medical condition, TIVA and Propofol  infusion  Airway Management Planned:   Additional Equipment:   Intra-op Plan:   Post-operative Plan:   Informed Consent: I have reviewed the patients History and Physical, chart, labs and discussed the procedure including the risks, benefits and alternatives for the proposed anesthesia with the patient or authorized representative who has indicated his/her understanding and acceptance.     Dental advisory given  Plan Discussed with: CRNA  Anesthesia Plan Comments:          Anesthesia Quick Evaluation

## 2023-09-27 ENCOUNTER — Encounter (HOSPITAL_COMMUNITY)
Admission: RE | Disposition: A | Discharge status: Home / Self Care | Payer: Self-pay | Source: Home / Self Care | Attending: Gastroenterology

## 2023-09-27 ENCOUNTER — Ambulatory Visit (HOSPITAL_BASED_OUTPATIENT_CLINIC_OR_DEPARTMENT_OTHER): Admitting: Anesthesiology

## 2023-09-27 ENCOUNTER — Other Ambulatory Visit: Payer: Self-pay

## 2023-09-27 ENCOUNTER — Ambulatory Visit (HOSPITAL_COMMUNITY): Admitting: Anesthesiology

## 2023-09-27 ENCOUNTER — Ambulatory Visit (HOSPITAL_COMMUNITY)
Admission: RE | Admit: 2023-09-27 | Discharge: 2023-09-27 | Disposition: A | Discharge status: Home / Self Care | Attending: Gastroenterology | Admitting: Gastroenterology

## 2023-09-27 ENCOUNTER — Encounter (HOSPITAL_COMMUNITY): Payer: Self-pay | Admitting: Gastroenterology

## 2023-09-27 DIAGNOSIS — D122 Benign neoplasm of ascending colon: Secondary | ICD-10-CM | POA: Diagnosis not present

## 2023-09-27 DIAGNOSIS — D120 Benign neoplasm of cecum: Secondary | ICD-10-CM | POA: Diagnosis not present

## 2023-09-27 DIAGNOSIS — K2100 Gastro-esophageal reflux disease with esophagitis, without bleeding: Secondary | ICD-10-CM

## 2023-09-27 DIAGNOSIS — Z87891 Personal history of nicotine dependence: Secondary | ICD-10-CM | POA: Diagnosis not present

## 2023-09-27 DIAGNOSIS — Z7951 Long term (current) use of inhaled steroids: Secondary | ICD-10-CM | POA: Diagnosis not present

## 2023-09-27 DIAGNOSIS — E66813 Obesity, class 3: Secondary | ICD-10-CM | POA: Diagnosis not present

## 2023-09-27 DIAGNOSIS — J439 Emphysema, unspecified: Secondary | ICD-10-CM | POA: Insufficient documentation

## 2023-09-27 DIAGNOSIS — Z85118 Personal history of other malignant neoplasm of bronchus and lung: Secondary | ICD-10-CM | POA: Diagnosis not present

## 2023-09-27 DIAGNOSIS — Z6841 Body Mass Index (BMI) 40.0 and over, adult: Secondary | ICD-10-CM | POA: Diagnosis not present

## 2023-09-27 DIAGNOSIS — K640 First degree hemorrhoids: Secondary | ICD-10-CM | POA: Diagnosis not present

## 2023-09-27 DIAGNOSIS — K449 Diaphragmatic hernia without obstruction or gangrene: Secondary | ICD-10-CM

## 2023-09-27 DIAGNOSIS — K219 Gastro-esophageal reflux disease without esophagitis: Secondary | ICD-10-CM

## 2023-09-27 DIAGNOSIS — Z860100 Personal history of colon polyps, unspecified: Secondary | ICD-10-CM

## 2023-09-27 DIAGNOSIS — Z923 Personal history of irradiation: Secondary | ICD-10-CM | POA: Diagnosis not present

## 2023-09-27 DIAGNOSIS — R1310 Dysphagia, unspecified: Secondary | ICD-10-CM | POA: Diagnosis present

## 2023-09-27 DIAGNOSIS — E0590 Thyrotoxicosis, unspecified without thyrotoxic crisis or storm: Secondary | ICD-10-CM | POA: Insufficient documentation

## 2023-09-27 DIAGNOSIS — K5730 Diverticulosis of large intestine without perforation or abscess without bleeding: Secondary | ICD-10-CM | POA: Diagnosis not present

## 2023-09-27 DIAGNOSIS — G4733 Obstructive sleep apnea (adult) (pediatric): Secondary | ICD-10-CM | POA: Diagnosis not present

## 2023-09-27 DIAGNOSIS — K2289 Other specified disease of esophagus: Secondary | ICD-10-CM | POA: Diagnosis not present

## 2023-09-27 DIAGNOSIS — D123 Benign neoplasm of transverse colon: Secondary | ICD-10-CM | POA: Diagnosis not present

## 2023-09-27 DIAGNOSIS — D126 Benign neoplasm of colon, unspecified: Secondary | ICD-10-CM

## 2023-09-27 DIAGNOSIS — J449 Chronic obstructive pulmonary disease, unspecified: Secondary | ICD-10-CM | POA: Diagnosis not present

## 2023-09-27 DIAGNOSIS — Z1211 Encounter for screening for malignant neoplasm of colon: Secondary | ICD-10-CM | POA: Diagnosis present

## 2023-09-27 DIAGNOSIS — K644 Residual hemorrhoidal skin tags: Secondary | ICD-10-CM

## 2023-09-27 HISTORY — PX: ESOPHAGOGASTRODUODENOSCOPY: SHX5428

## 2023-09-27 HISTORY — PX: COLONOSCOPY: SHX5424

## 2023-09-27 HISTORY — PX: SAVORY DILATION: SHX5439

## 2023-09-27 SURGERY — COLONOSCOPY
Anesthesia: Monitor Anesthesia Care

## 2023-09-27 MED ORDER — PROPOFOL 1000 MG/100ML IV EMUL
INTRAVENOUS | Status: AC
  Filled 2023-09-27: qty 200

## 2023-09-27 MED ORDER — LIDOCAINE 2% (20 MG/ML) 5 ML SYRINGE
INTRAMUSCULAR | Status: DC | PRN
  Administered 2023-09-27: 100 mg via INTRAVENOUS

## 2023-09-27 MED ORDER — PROPOFOL 500 MG/50ML IV EMUL
INTRAVENOUS | Status: AC
  Filled 2023-09-27: qty 50

## 2023-09-27 MED ORDER — PROPOFOL 10 MG/ML IV BOLUS
INTRAVENOUS | Status: DC | PRN
  Administered 2023-09-27: 10 mg via INTRAVENOUS

## 2023-09-27 MED ORDER — PROPOFOL 1000 MG/100ML IV EMUL
INTRAVENOUS | Status: AC
  Filled 2023-09-27: qty 100

## 2023-09-27 MED ORDER — SODIUM CHLORIDE 0.9 % IV SOLN: INTRAVENOUS | Status: DC

## 2023-09-27 MED ORDER — PROPOFOL 10 MG/ML IV BOLUS
INTRAVENOUS | Status: AC
Start: 2023-09-27 — End: 2023-09-27
  Filled 2023-09-27: qty 20

## 2023-09-27 MED ORDER — PROPOFOL 500 MG/50ML IV EMUL
INTRAVENOUS | Status: DC | PRN
  Administered 2023-09-27: 150 ug/kg/min via INTRAVENOUS

## 2023-09-27 NOTE — Anesthesia Postprocedure Evaluation (Signed)
 Anesthesia Post Note  Patient: Vincent Sutton  Procedure(s) Performed: COLONOSCOPY EGD (ESOPHAGOGASTRODUODENOSCOPY) EGD, WITH DILATION USING SAVARY-GILLIARD DILATOR OVER GUIDEWIRE     Patient location during evaluation: PACU Anesthesia Type: MAC Level of consciousness: awake and alert Pain management: pain level controlled Vital Signs Assessment: post-procedure vital signs reviewed and stable Respiratory status: spontaneous breathing Cardiovascular status: stable Anesthetic complications: no   No notable events documented.  Last Vitals:  Vitals:   09/27/23 0820 09/27/23 0830  BP: (!) 156/84 (!) 141/95  Pulse: (!) 103 100  Resp: 16 20  Temp:    SpO2: 99% 95%    Last Pain:  Vitals:   09/27/23 0830  TempSrc:   PainSc: 0-No pain                 Norleen Pope

## 2023-09-27 NOTE — Op Note (Signed)
 Vincent Sutton Patient Name: Vincent Sutton Procedure Date: 09/27/2023 MRN: [id number] Attending MD: Glendia BRAVO. Stacia , MD, [id number] Date of Birth: Oct 21, 1960 CSN: [id number] Age: 63 Admit Type: Outpatient Procedure:                Upper GI endoscopy Indications:              Dysphagia Providers:                Glendia E. Stacia, MD, Ozell Pouch, Curtistine Bishop, Technician Referring MD:              Medicines:                Monitored Anesthesia Care Complications:            No immediate complications. Estimated Blood Loss:     Estimated blood loss was minimal. Procedure:                Pre-Anesthesia Assessment:                           - Prior to the procedure, a History and Physical                            was performed, and patient medications and                            allergies were reviewed. The patient's tolerance of                            previous anesthesia was also reviewed. The risks                            and benefits of the procedure and the sedation                            options and risks were discussed with the patient.                            All questions were answered, and informed consent                            was obtained. Prior Anticoagulants: The patient has                            taken no anticoagulant or antiplatelet agents. ASA                            Grade Assessment: III - A patient with severe                            systemic disease. After reviewing the risks and  benefits, the patient was deemed in satisfactory                            condition to undergo the procedure.                           After obtaining informed consent, the endoscope was                            passed under direct vision. Throughout the                            procedure, the patient's blood pressure, pulse, and                            oxygen  saturations were monitored continuously. The                            [id number]-[id number] ([id number]) Olympus endoscope was introduced                            through the mouth, and advanced to the second part                            of duodenum. The upper GI endoscopy was                            accomplished without difficulty. The patient                            tolerated the procedure well. Scope In: Scope Out: Findings:      One tongue of salmon-colored mucosa was present at 42 cm. No other       visible abnormalities were present. The maximum longitudinal extent of       these esophageal mucosal changes was 1 cm in length. Biopsies were taken       with a cold forceps for histology. Estimated blood loss was minimal.      The exam of the esophagus was otherwise normal.      A guidewire was placed and the scope was withdrawn. Dilation was       performed in the lower third of the esophagus with a Savary dilator with       no resistance at 17 mm. No post dilation effect was noted.      A 2 cm hiatal hernia was present.      The entire examined stomach was normal.      The examined duodenum was normal. Impression:               - Salmon-colored mucosa suspicious for                            short-segment Barrett's esophagus. Biopsied.                           - 2 cm hiatal hernia.                           -  Normal stomach.                           - Normal examined duodenum.                           - Dilation performed in the lower third of the                            esophagus with a 17 mm Savary with no post dilation                            effect. Moderate Sedation:      Not Applicable - Patient had care per Anesthesia. Recommendation:           - Patient has a contact number available for                            emergencies. The signs and symptoms of potential                            delayed complications were discussed with the                             patient. Return to normal activities tomorrow.                            Written discharge instructions were provided to the                            patient.                           - Resume previous diet.                           - Continue present medications.                           - Await pathology results.                           - Consider esophageal manometry if patient                            continues to have significant swallowing                            difficulties. Procedure Code(s):        --- Professional ---                           [phone], Esophagogastroduodenoscopy, flexible,                            transoral; with insertion of guide wire followed by  passage of dilator(s) through esophagus over guide                            wire                           43239, 59, Esophagogastroduodenoscopy, flexible,                            transoral; with biopsy, single or multiple Diagnosis Code(s):        --- Professional ---                           K22.89, Other specified disease of esophagus                           K44.9, Diaphragmatic hernia without obstruction or                            gangrene                           R13.10, Dysphagia, unspecified CPT copyright 2022 [employer]. All rights reserved. The codes documented in this report are preliminary and upon coder review may  be revised to meet current compliance requirements. Karmine Kauer E. Stacia, MD 09/27/2023 8:26:39 AM This report has been signed electronically. Number of Addenda: 0

## 2023-09-27 NOTE — Op Note (Addendum)
 [facility] Patient Name: Vincent Sutton Procedure Date: 09/27/2023 MRN: [id number] Attending MD: Glendia BRAVO. Stacia , MD, [id number] Date of Birth: October 28, 1960 CSN: [id number] Age: 63 Admit Type: Outpatient Procedure:                Colonoscopy Indications:              Surveillance: Personal history of colonic polyps                            (unknown histology) on last colonoscopy more than 5                            years ago (2 polyps in 2019, histology not known) Providers:                Glendia E. Stacia, MD, Ozell Pouch, Curtistine Bishop, Technician Referring MD:              Medicines:                Monitored Anesthesia Care Complications:            No immediate complications. Estimated Blood Loss:     Estimated blood loss was minimal. Procedure:                Pre-Anesthesia Assessment:                           - Prior to the procedure, a History and Physical                            was performed, and patient medications and                            allergies were reviewed. The patient's tolerance of                            previous anesthesia was also reviewed. The risks                            and benefits of the procedure and the sedation                            options and risks were discussed with the patient.                            All questions were answered, and informed consent                            was obtained. Prior Anticoagulants: The patient has                            taken no anticoagulant or antiplatelet agents. ASA  Grade Assessment: III - A patient with severe                            systemic disease. After reviewing the risks and                            benefits, the patient was deemed in satisfactory                            condition to undergo the procedure.                           - Prior to the procedure, a History and Physical                             was performed, and patient medications and                            allergies were reviewed. The patient's tolerance of                            previous anesthesia was also reviewed. The risks                            and benefits of the procedure and the sedation                            options and risks were discussed with the patient.                            All questions were answered, and informed consent                            was obtained. Prior Anticoagulants: The patient has                            taken no anticoagulant or antiplatelet agents. ASA                            Grade Assessment: III - A patient with severe                            systemic disease. After reviewing the risks and                            benefits, the patient was deemed in satisfactory                            condition to undergo the procedure.                           After obtaining informed consent, the colonoscope  was passed under direct vision. Throughout the                            procedure, the patient's blood pressure, pulse, and                            oxygen saturations were monitored continuously. The                            [id number]-HQ190L ([id number]) Olympus colonoscope was                            introduced through the anus and advanced to the the                            cecum, identified by appendiceal orifice and                            ileocecal valve. The colonoscopy was performed                            without difficulty. The patient tolerated the                            procedure well. The quality of the bowel                            preparation was adequate. The ileocecal valve,                            appendiceal orifice, and rectum were photographed.                            The bowel preparation used was SUPREP via split                            dose instruction. Scope In:  7:50:56 AM Scope Out: 8:13:35 AM Scope Withdrawal Time: 0 hours 18 minutes 1 second  Total Procedure Duration: 0 hours 22 minutes 39 seconds  Findings:      External hemorrhoids were found on perianal exam.      The digital rectal exam was normal. Pertinent negatives include normal       sphincter tone and no palpable rectal lesions.      A 2 mm polyp was found in the cecum. The polyp was sessile. The polyp       was removed with a cold snare. Resection and retrieval were complete.       Estimated blood loss was minimal.      A 3 mm polyp was found in the ascending colon. The polyp was sessile.       The polyp was removed with a cold snare. Resection and retrieval were       complete. Estimated blood loss was minimal.      Two sessile polyps were found in the transverse colon. The polyps were 2       to 3 mm in  size. These polyps were removed with a saline injection-lift       technique using a hot snare. Resection and retrieval were complete.       Estimated blood loss was minimal.      A 3 mm polyp was found in the splenic flexure. The polyp was sessile.       The polyp was removed with a cold snare. Resection and retrieval were       complete. Estimated blood loss was minimal.      A few small-mouthed diverticula were found in the transverse colon and       ascending colon.      The exam was otherwise normal throughout the examined colon.      Non-bleeding internal hemorrhoids were found during retroflexion. The       hemorrhoids were Grade I (internal hemorrhoids that do not prolapse).      No additional abnormalities were found on retroflexion. Impression:               - Hemorrhoids found on perianal exam.                           - One 2 mm polyp in the cecum, removed with a cold                            snare. Resected and retrieved.                           - One 3 mm polyp in the ascending colon, removed                            with a cold snare. Resected and  retrieved.                           - Two 2 to 3 mm polyps in the transverse colon,                            removed using injection-lift and a hot snare.                            Resected and retrieved.                           - One 3 mm polyp at the splenic flexure, removed                            with a cold snare. Resected and retrieved.                           - Mild diverticulosis in the transverse colon and                            in the ascending colon.                           - Non-bleeding internal hemorrhoids. Moderate Sedation:      Not Applicable -  Patient had care per Anesthesia. Recommendation:           - Patient has a contact number available for                            emergencies. The signs and symptoms of potential                            delayed complications were discussed with the                            patient. Return to normal activities tomorrow.                            Written discharge instructions were provided to the                            patient.                           - Resume previous diet.                           - Repeat colonoscopy (date not yet determined) for                            surveillance based on pathology results. Procedure Code(s):        --- Professional ---                           [phone], Colonoscopy, flexible; with removal of                            tumor(s), polyp(s), or other lesion(s) by snare                            technique                           45381, Colonoscopy, flexible; with directed                            submucosal injection(s), any substance Diagnosis Code(s):        --- Professional ---                           Z86.010, Personal history of colonic polyps                           D12.0, Benign neoplasm of cecum                           D12.2, Benign neoplasm of ascending colon                           D12.3, Benign neoplasm of transverse colon (hepatic  flexure or splenic flexure)                           K64.0, First degree hemorrhoids                           K57.30, Diverticulosis of large intestine without                            perforation or abscess without bleeding CPT copyright 2022 American [employer] Association. All rights reserved. The codes documented in this report are preliminary and upon coder review may  be revised to meet current compliance requirements. Mckinnley Smithey E. Stacia, MD 09/27/2023 8:33:07 AM This report has been signed electronically. Number of Addenda: 1 Addendum Number: 1   Addendum Date: 10/02/2023 6:13:35 AM      The two small polyps in the transverse colon were removed with cold       snare, not injection/hot snare. Licet Dunphy E. Stacia, MD 10/02/2023 6:14:51 AM This report has been signed electronically.

## 2023-09-27 NOTE — Discharge Instructions (Signed)
YOU HAD AN ENDOSCOPIC PROCEDURE TODAY: Refer to the procedure report and other information in the discharge instructions given to you for any specific questions about what was found during the examination. If this information does not answer your questions, please call [place] office at [phone] to clarify.   YOU SHOULD EXPECT: Some feelings of bloating in the abdomen. Passage of more gas than usual. Walking can help get rid of the air that was put into your GI tract during the procedure and reduce the bloating. If you had a lower endoscopy (such as a colonoscopy or flexible sigmoidoscopy) you may notice spotting of blood in your stool or on the toilet paper. Some abdominal soreness may be present for a day or two, also.  DIET: Your first meal following the procedure should be a light meal and then it is ok to progress to your normal diet. A half-sandwich or bowl of soup is an example of a good first meal. Heavy or fried foods are harder to digest and may make you feel nauseous or bloated. Drink plenty of fluids but you should avoid alcoholic beverages for 24 hours.   ACTIVITY: Your care partner should take you home directly after the procedure. You should plan to take it easy, moving slowly for the rest of the day. You can resume normal activity the day after the procedure however YOU SHOULD NOT DRIVE, use power tools, machinery or perform tasks that involve climbing or major physical exertion for 24 hours (because of the sedation medicines used during the test).   SYMPTOMS TO REPORT IMMEDIATELY: A gastroenterologist can be reached at any hour. Please call [phone]  for any of the following symptoms:  Following lower endoscopy (colonoscopy, flexible sigmoidoscopy) Excessive amounts of blood in the stool  Significant tenderness, worsening of abdominal pains  Swelling of the abdomen that is new, acute  Fever of 100 or higher  Following upper endoscopy (EGD, EUS, ERCP, esophageal  dilation) Vomiting of blood or coffee ground material  New, significant abdominal pain  New, significant chest pain or pain under the shoulder blades  Painful or persistently difficult swallowing  New shortness of breath  Black, tarry-looking or red, bloody stools  FOLLOW UP:  If any biopsies were taken you will be contacted by phone or by letter within the next 1-3 weeks. Call [phone]  if you have not heard about the biopsies in 3 weeks.  Please also call with any specific questions about appointments or follow up tests.
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
## 2023-09-27 NOTE — Transfer of Care (Signed)
 Immediate Anesthesia Transfer of Care Note  Patient: Vincent Sutton  Procedure(s) Performed: COLONOSCOPY EGD (ESOPHAGOGASTRODUODENOSCOPY) EGD, WITH DILATION USING SAVARY-GILLIARD DILATOR OVER GUIDEWIRE  Patient Location: PACU and Endoscopy Unit  Anesthesia Type:MAC  Level of Consciousness: drowsy and patient cooperative  Airway & Oxygen Therapy: Patient Spontanous Breathing and Patient connected to face mask oxygen  Post-op Assessment: Report given to RN and Post -op Vital signs reviewed and stable  Post vital signs: Reviewed and stable  Last Vitals:  Vitals Value Taken Time  BP 181/82 09/27/23 08:18  Temp    Pulse 102 09/27/23 08:19  Resp 14 09/27/23 08:19  SpO2 99 % 09/27/23 08:19  Vitals shown include unfiled device data.  Last Pain:  Vitals:   09/27/23 0702  TempSrc: Temporal  PainSc: 0-No pain      Patients Stated Pain Goal: 0 (09/27/23 9297)  Complications: No notable events documented.

## 2023-09-28 LAB — SURGICAL PATHOLOGY

## 2023-09-30 ENCOUNTER — Encounter (HOSPITAL_COMMUNITY): Payer: Self-pay | Admitting: Gastroenterology

## 2023-10-02 ENCOUNTER — Ambulatory Visit: Payer: Self-pay | Admitting: Gastroenterology

## 2023-10-02 NOTE — Progress Notes (Signed)
 Vincent Sutton,  The biopsies of your esophagus showed inflammatory changes related to acid reflux, but did not show any precancerous changes (Barrett's esophagus).  This is good news.  No further endoscopic surveillance is recommended.  If your swallowing difficulties continue to be a significant issue, I would recommend esophageal manometry for further evaluation.   All five polyps that I removed during your recent colonoscopy were completely benign but were proven to be pre-cancerous polyps that MAY have grown into cancers if they had not been removed.  Studies shows that at least 20% of women over age 29 and 30% of men over age 38 have pre-cancerous polyps.  Based on current nationally recognized surveillance guidelines, I recommend that you have a repeat colonoscopy in 3 years.   If you develop any new rectal bleeding, abdominal pain or significant bowel habit changes, please contact me before then.

## 2023-10-29 ENCOUNTER — Ambulatory Visit (HOSPITAL_BASED_OUTPATIENT_CLINIC_OR_DEPARTMENT_OTHER): Payer: Self-pay | Admitting: Pulmonary Disease

## 2023-10-29 ENCOUNTER — Ambulatory Visit: Admitting: Pulmonary Disease

## 2023-10-29 VITALS — BP 119/80 | HR 96

## 2023-10-29 DIAGNOSIS — R051 Acute cough: Secondary | ICD-10-CM

## 2023-10-29 MED ORDER — METHYLPREDNISOLONE ACETATE 80 MG/ML IJ SUSP
120.0000 mg | Freq: Once | INTRAMUSCULAR | Status: AC
  Administered 2023-10-29: 120 mg via INTRAMUSCULAR

## 2023-10-29 MED ORDER — CLARITHROMYCIN 500 MG PO TABS: 500.0000 mg | ORAL_TABLET | Freq: Two times a day (BID) | ORAL | 0 refills | Status: DC

## 2023-10-29 MED ORDER — PREDNISONE 20 MG PO TABS: 40.0000 mg | ORAL_TABLET | Freq: Every day | ORAL | 0 refills | Status: AC

## 2023-10-29 NOTE — Telephone Encounter (Signed)
 FYI Only or Action Required?: FYI only for provider.  Patient is followed in Pulmonology for COPD, last seen on 09/03/2023 by Hope Almarie ORN, NP.  Called Nurse Triage reporting Cough.  Symptoms began several days ago.  Interventions attempted: OTC medications: mucinex , Maintenance inhaler, Nebulizer treatments, and Home oxygen use.  Symptoms are: unchanged.  Triage Disposition: See HCP Within 4 Hours (Or PCP Triage)  Patient/caregiver understands and will follow disposition?: Yes         Reason for Disposition  [1] MILD difficulty breathing (e.g., minimal/no SOB at rest, SOB with walking, pulse < 100) AND [2] still present when not coughing  Answer Assessment - Initial Assessment Questions E2C2 Pulmonary Triage - Initial Assessment Questions Chief Complaint (e.g., cough, sob, wheezing, fever, chills, sweat or additional symptoms) *Go to specific symptom protocol after initial questions. Productive cough, breathing problems  How long have symptoms been present? X1 week  Have you tested for COVID or Flu? Note: If not, ask patient if a home test No  MEDICINES:   Have you used any OTC meds to help with symptoms? Yes If yes, ask What medications? Mucinex  with minimal relief  Have you used your inhalers/maintenance medication? Yes If yes, What medications? Neb with minimal relief Tiotropium Bromide -Olodaterol (STIOLTO - 2 puffs a day Albuterol  INH  Triager reviewed/reinforced albuterol  usage and SIG.    If inhaler, ask How many puffs and how often? Note: Review instructions on medication in the chart. See above  OXYGEN: Do you wear supplemental oxygen? Yes If yes, How many liters are you supposed to use? 3L with exertion  Do you monitor your oxygen levels? No If yes, What is your reading (oxygen level) today? Oxygen meter is down  What is your usual oxygen saturation reading?  (Note: Pulmonary O2 sats should be 90% or  greater) N/a     1. ONSET: When did the cough begin?      See above 2. SEVERITY: How bad is the cough today?      See above 3. SPUTUM: Describe the color of your sputum (e.g., none, dry cough; clear, white, yellow, green)     See above 4. HEMOPTYSIS: Are you coughing up any blood? If Yes, ask: How much? (e.g., flecks, streaks, tablespoons, etc.)     denies 5. DIFFICULTY BREATHING: Are you having difficulty breathing? If Yes, ask: How bad is it? (e.g., mild, moderate, severe)      Mild-moderate Triager does appreciate audible mild SOB during call. Pt is speaking in partial-full sentences.  6. FEVER: Do you have a fever? If Yes, ask: What is your temperature, how was it measured, and when did it start?     denies 7. CARDIAC HISTORY: Do you have any history of heart disease? (e.g., heart attack, congestive heart failure)      denies 8. LUNG HISTORY: Do you have any history of lung disease?  (e.g., pulmonary embolus, asthma, emphysema)     COPD Lung cancer 9. PE RISK FACTORS: Do you have a history of blood clots? (or: recent major surgery, recent prolonged travel, bedridden)     denies 10. OTHER SYMPTOMS: Do you have any other symptoms? (e.g., runny nose, wheezing, chest pain)       wheezing 11. PREGNANCY: Is there any chance you are pregnant? When was your last menstrual period?       N/a 12. TRAVEL: Have you traveled out of the country in the last month? (e.g., travel history, exposures)  N/a  Protocols used: Cough - Acute Productive-A-AH

## 2023-10-29 NOTE — Patient Instructions (Signed)
 We will give you a shot of Depo-Medrol   Prescription for prednisone  sent to pharmacy for you  Prescription for Biaxin  sent to pharmacy for you  Follow-up appointment with Dr. Kassie in about 3 months  Call us  with significant concerns
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
## 2023-10-29 NOTE — Telephone Encounter (Signed)
FYI-pt has appt today

## 2023-10-29 NOTE — Telephone Encounter (Signed)
 Copied from CRM [id number]. Topic: Clinical - Red Word Triage >> Oct 29, 2023  8:22 AM Vincent Sutton wrote: Red Word that prompted transfer to Nurse Triage: Hard to breathe - coughing up color/clear phlegm - states stage 4 COPD is acting up and has lung cancer (has a lung and a half), paired with ears hurting, sinuses hurting, and eyes crusting over while asleep.

## 2023-11-29 ENCOUNTER — Ambulatory Visit (HOSPITAL_BASED_OUTPATIENT_CLINIC_OR_DEPARTMENT_OTHER): Payer: Self-pay | Admitting: Pulmonary Disease

## 2023-11-29 ENCOUNTER — Ambulatory Visit (INDEPENDENT_AMBULATORY_CARE_PROVIDER_SITE_OTHER)

## 2023-11-29 VITALS — BP 136/80 | HR 90 | Temp 98.2°F | Ht 68.0 in | Wt 276.0 lb

## 2023-11-29 DIAGNOSIS — Z85118 Personal history of other malignant neoplasm of bronchus and lung: Secondary | ICD-10-CM | POA: Diagnosis not present

## 2023-11-29 DIAGNOSIS — Z6841 Body Mass Index (BMI) 40.0 and over, adult: Secondary | ICD-10-CM | POA: Diagnosis not present

## 2023-11-29 DIAGNOSIS — J441 Chronic obstructive pulmonary disease with (acute) exacerbation: Secondary | ICD-10-CM | POA: Diagnosis not present

## 2023-11-29 DIAGNOSIS — J9611 Chronic respiratory failure with hypoxia: Secondary | ICD-10-CM

## 2023-11-29 MED ORDER — METHYLPREDNISOLONE ACETATE 80 MG/ML IJ SUSP
120.0000 mg | Freq: Once | INTRAMUSCULAR | Status: AC
  Administered 2023-11-29: 120 mg via INTRAMUSCULAR

## 2023-11-29 MED ORDER — PREDNISONE 10 MG PO TABS: ORAL_TABLET | ORAL | 0 refills | Status: DC

## 2023-11-29 MED ORDER — LEVOFLOXACIN 750 MG PO TABS: 750.0000 mg | ORAL_TABLET | Freq: Every day | ORAL | 0 refills | Status: DC

## 2023-11-29 NOTE — Patient Instructions (Addendum)
 It was a pleasure to see you today. Please have your labs drawn and chest Xray taken in our clinic today. Take the new antibiotics for 7 days and 12 days steroids course

## 2023-11-29 NOTE — Assessment & Plan Note (Signed)
 Advised patient to monitor oxygen saturation at home regularly Advised him to visit ED if he notices desaturation.  May need IV steroids and IV antibiotics if fails current regimen.

## 2023-11-29 NOTE — Progress Notes (Signed)
 Established Patient Pulmonology Office Visit   Subjective:  Patient ID: Vincent Sutton, male    DOB: 02-06-1961  MRN: [id number]  CC:  Chief Complaint  Patient presents with   Cough    Tx with pred 09/08. Wheezing, prod cough with clear sputum and SOB with exertion.     Discussed the use of AI scribe software for clinical note transcription with the patient, who gave verbal consent to proceed.   HPI 63 year old male, former smoker (60 pack years) followed for severe COPD and chronic respiratory failure. 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 He is a patient of Dr. Laymond and last seen in office 04/10/2022  Has seen [facility] NP and [facility] since then. He has history of recurrent COPD exacerbations and was last seen in this clinic on September 09/2023 when he was treated with a Depo-Medrol  shot in the office 120 mg, prednisone  40 mg for 5 days and Biaxin  500 twice a day for 7 days.  Usual medications include Stiolto, Asmanex , albuterol  inhaler and nebulization treatments as needed . He is also on daliresp  daily  He has had multiple flareups of COPD this year.  This is his fourth clinic visit for the same reason. Patient reports his symptoms improved somewhat after recent steroids and antibiotic course, but did not improve completely. He reports cough, wheezing, daily productive sputum.  He has more sputum in the morning He reports increased shortness of breath.  He has to increase oxygen flow at home to 3-4 L .  His pulse ox is broken.    ROS Review of symptoms negative except mentioned above     Current Outpatient Medications:    albuterol  (PROVENTIL ) (2.5 MG/3ML) 0.083% nebulizer solution, Take 3 mLs (2.5 mg total) by nebulization every 6 (six) hours as needed for wheezing or shortness of breath., Disp: 180 mL, Rfl: 5   albuterol  (VENTOLIN  HFA) 108 (90 Base) MCG/ACT inhaler, Inhale 2 puffs into the lungs every  6 (six) hours as needed for wheezing or shortness of breath., Disp: 18 each, Rfl: 5   budesonide  (PULMICORT ) 0.5 MG/2ML nebulizer solution, Take 2 mLs (0.5 mg total) by nebulization daily., Disp: 120 mL, Rfl: 2   cetirizine (ZYRTEC) 10 MG tablet, Take 10 mg by mouth daily., Disp: , Rfl:    Cholecalciferol (VITAMIN D3) 5000 units TABS, Take 2,000 Units by mouth daily. , Disp: , Rfl:    clarithromycin  (BIAXIN ) 500 MG tablet, Take 1 tablet (500 mg total) by mouth 2 (two) times daily., Disp: 14 tablet, Rfl: 0   fluticasone  (FLONASE ) 50 MCG/ACT nasal spray, Place 2 sprays into both nostrils daily., Disp: 18.2 mL, Rfl: 2   levofloxacin  (LEVAQUIN ) 750 MG tablet, Take 1 tablet (750 mg total) by mouth daily., Disp: 7 tablet, Rfl: 0   methimazole (TAPAZOLE) 10 MG tablet, Take 10 mg by mouth daily., Disp: , Rfl:    mometasone  (ASMANEX , 120 METERED DOSES,) 220 MCG/ACT inhaler, Inhale 2 puffs into the lungs daily., Disp: 1 each, Rfl: 11   Na Sulfate-K Sulfate-Mg Sulfate concentrate (SUPREP) 17.5-3.13-1.6 GM/177ML SOLN, Take 1 kit (354 mLs total) by mouth as directed., Disp: 354 mL, Rfl: 0   omeprazole  (PRILOSEC) 20 MG capsule, Take 1 capsule (20 mg total) by mouth 2 (two) times daily before a meal. Take before breakfast, take on an empty stomach, Disp: 60 capsule, Rfl: 2   OXYGEN, Inhale 3 L into the  lungs. 24/7 when active, Disp: , Rfl:    PARoxetine (PAXIL) 40 MG tablet, Take 40 mg by mouth daily. Take 1/2 tablet, Disp: , Rfl:    roflumilast  (DALIRESP ) 500 MCG TABS tablet, Take 1 tablet (500 mcg total) by mouth daily. Take on a full stomach, Disp: 30 tablet, Rfl: 6   Tiotropium Bromide -Olodaterol (STIOLTO RESPIMAT ) 2.5-2.5 MCG/ACT AERS, Inhale 2 puffs into the lungs daily., Disp: 4 g, Rfl: 11   predniSONE  (DELTASONE ) 10 MG tablet, 4 tabs for 3 days, then 3 tabs for 3 days, 2 tabs for 3 days, then 1 tab for 3 days, then stop, Disp: 30 tablet, Rfl: 0  Current Facility-Administered Medications:     methylPREDNISolone  acetate (DEPO-MEDROL ) injection 120 mg, 120 mg, Intramuscular, Once,        Objective:  BP 136/80   Pulse 90   Temp 98.2 F (36.8 C) (Oral)   Ht 5' 8 (1.727 m)   Wt 276 lb (125.2 kg)   SpO2 93% Comment: 3L  BMI 41.97 kg/m    Physical Exam Constitutional:      General: He is not in acute distress.    Appearance: Normal appearance. He is obese.  HENT:     Mouth/Throat:     Mouth: Mucous membranes are moist.  Cardiovascular:     Rate and Rhythm: Normal rate.  Pulmonary:     Effort: No respiratory distress.     Breath sounds: No wheezing or rales.     Comments: Markedly Decreased breath sounds bilateral lungs   Musculoskeletal:     Right lower leg: No edema.     Left lower leg: No edema.  Skin:    General: Skin is warm.  Neurological:     Mental Status: He is alert and oriented to person, place, and time.  Psychiatric:        Mood and Affect: Mood normal.     Pft    Latest Ref Rng & Units 08/03/2014   12:17 PM  PFT Results  FVC-Pre L 2.78   FVC-Predicted Pre % 62   FVC-Post L 2.79   FVC-Predicted Post % 62   Pre FEV1/FVC % % 52   Post FEV1/FCV % % 52   FEV1-Pre L 1.45   FEV1-Predicted Pre % 42   FEV1-Post L 1.44   DLCO uncorrected ml/min/mmHg 17.75   DLCO UNC% % 62   DLCO corrected ml/min/mmHg 18.29   DLCO COR %Predicted % 64   DLVA Predicted % 92   TLC L 6.23   TLC % Predicted % 97   RV % Predicted % 151            Assessment & Plan:   Assessment & Plan COPD with acute exacerbation (HCC) He has had multiple flareups of COPD this year.  This is his fourth clinic visit for the same reason. Patient reports his symptoms improved somewhat after recent steroids and antibiotic course, but did not improve completely. Will order a chest x-ray and a sputum culture today to r/o pneumonia Will check CBC with differential count and IgE level.  Patient reports allergies and takes over-the-counter antihistaminics occasionally He is  currently compliant with Daliresp , Asmanex , Stiolto.  Also uses albuterol  inhaler and nebulizer He may be a candidate for antieosinophilic agents or chronic azithromycin  in future Orders:   methylPREDNISolone  acetate (DEPO-MEDROL ) injection 120 mg   CBC with Differential   IgE; Future   DG Chest 2 View; Future   predniSONE  (DELTASONE ) 10 MG tablet; 4  tabs for 3 days, then 3 tabs for 3 days, 2 tabs for 3 days, then 1 tab for 3 days, then stop   levofloxacin  (LEVAQUIN ) 750 MG tablet; Take 1 tablet (750 mg total) by mouth daily.   Respiratory or Resp and Sputum Culture; Future Checked prior EKG, QTc was less than 460 History of lung cancer Patient reports history of stage IIb lung cancer.  Status post lobectomy of left upper lobe [facility] regular surveillance CT scans through [place].  Recent scans have been reassuring as per the patient    BMI 40.0-44.9, adult (HCC) Obesity likely worsening his overall symptoms   Chronic hypoxemic respiratory failure (HCC) Advised patient to monitor oxygen saturation at home regularly Advised him to visit ED if he notices desaturation.  May need IV steroids and IV antibiotics if fails current regimen.        Return in about 3 weeks (around 12/20/2023).   Burlin Mcnair Pleas, MD [facility] Office: [phone]

## 2023-11-29 NOTE — Telephone Encounter (Signed)
 Appointment made for today 11/29/2023 at 3pm with Dr Pleas LIPS Only or Action Required?: FYI only for provider.  Patient is followed in Pulmonology for COPD, last seen on 10/29/2023 by Neda Jennet LABOR, MD.  Called Nurse Triage reporting Shortness of Breath.  Symptoms began about a month ago.  Interventions attempted: Rescue inhaler, Maintenance inhaler, Home oxygen use, and Other: regularly prescribed medications.  Symptoms are: gradually worsening.  Triage Disposition: See HCP Within 4 Hours (Or PCP Triage)  Patient/caregiver understands and will follow disposition?: Yes   Copied from CRM [id number]. Topic: Clinical - Red Word Triage >> Nov 29, 2023 11:22 AM Leila C wrote: Red Word that prompted transfer to Nurse Triage: Patient [id number] states is having breath issues, shortness of breathing, wheezing, and coughing up phlegm but cannot. Patient denies pain, dizziness, nor fever. Patient is asking to be seen by Dr. Kassie or NP or have Prednisone  called in. Please advise. Reason for Disposition . [1] Longstanding difficulty breathing (e.g., CHF, COPD, emphysema) AND [2] WORSE than normal  Answer Assessment - Initial Assessment Questions Patient states he was seen last month and given meds for 4-5 days & patient states he finished those and he feels like his symptoms started back over again.---cough sometimes productive with increasing shortness of breath worse on exertion Coughing up phlegm--clear [facility] so he has no way to check his oxygen saturation level at this time   E2C2 Pulmonary Triage - Initial Assessment Questions Chief Complaint (e.g., cough, sob, wheezing, fever, chills, sweat or additional symptoms) *Go to specific symptom protocol after initial questions. Shortness of breath worse on exertion, cough--still not better from recent visit on 10/29/2023  How long have symptoms been present? Over a month now  Have you tested for  COVID or Flu? Note: If not, ask patient if a home test can be taken. If so, instruct patient to call back for positive results. No  MEDICINES:   Have you used any OTC meds to help with symptoms? Yes If yes, ask What medications? Patient states a generic medication similar to Mucinex   Have you used your inhalers/maintenance medication? Yes If yes, What medications? Albuterol  Inhaler--Inhale 2 puffs into the lungs every 6 (six) hours as needed for wheezing or shortness of breath.  Respimat--Inhale 2 puffs into the lungs daily  Mometasone --Inhale 2 puffs into the lungs daily.   If inhaler, ask How many puffs and how often? Note: Review instructions on medication in the chart. Albuterol  Inhaler--Inhale 2 puffs into the lungs every 6 (six) hours as needed for wheezing or shortness of breath.  Respimat--Inhale 2 puffs into the lungs daily  Mometasone --Inhale 2 puffs into the lungs daily.   OXYGEN: Do you wear supplemental oxygen? Yes If yes, How many liters are you supposed to use? 3 liters  Do you monitor your oxygen levels? Yes If yes, What is your reading (oxygen level) today? N/a  What is your usual oxygen saturation reading?  (Note: Pulmonary O2 sats should be 90% or greater) 95-97%   3. PATTERN Does the difficult breathing come and go, or has it been constant since it started?      Worse on exertion 4. SEVERITY: How bad is your breathing? (e.g., mild, moderate, severe)      Worse than normal 5. RECURRENT SYMPTOM: Have you had difficulty breathing before? If Yes, ask: When was the last time? and What happened that time?      ---- 6.  CARDIAC HISTORY: Do you have any history of heart disease? (e.g., heart attack, angina, bypass surgery, angioplasty)      --- 7. LUNG HISTORY: Do you have any history of lung disease?  (e.g., pulmonary embolus, asthma, emphysema)     COPD, Lung cancer 9. OTHER SYMPTOMS: Do you have any other symptoms? (e.g.,  chest pain, cough, dizziness, fever, runny nose)     Cough  Protocols used: Breathing Difficulty-A-AH

## 2023-11-29 NOTE — Assessment & Plan Note (Addendum)
 He has had multiple flareups of COPD this year.  This is his fourth clinic visit for the same reason. Patient reports his symptoms improved somewhat after recent steroids and antibiotic course, but did not improve completely. Will order a chest x-ray and a sputum culture today to r/o pneumonia Will check CBC with differential count and IgE level.  Patient reports allergies and takes over-the-counter antihistaminics occasionally He is currently compliant with Daliresp , Asmanex , Stiolto.  Also uses albuterol  inhaler and nebulizer He may be a candidate for antieosinophilic agents or chronic azithromycin  in future Orders:   methylPREDNISolone  acetate (DEPO-MEDROL ) injection 120 mg   CBC with Differential   IgE; Future   DG Chest 2 View; Future   predniSONE  (DELTASONE ) 10 MG tablet; 4 tabs for 3 days, then 3 tabs for 3 days, 2 tabs for 3 days, then 1 tab for 3 days, then stop   levofloxacin  (LEVAQUIN ) 750 MG tablet; Take 1 tablet (750 mg total) by mouth daily.   Respiratory or Resp and Sputum Culture; Future Checked prior EKG, QTc was less than 460

## 2023-11-30 ENCOUNTER — Ambulatory Visit: Payer: Self-pay

## 2023-11-30 LAB — CBC WITH DIFFERENTIAL/PLATELET
Basophils Absolute: 0.1 K/uL (ref 0.0–0.1)
Basophils Relative: 1 % (ref 0.0–3.0)
Eosinophils Absolute: 0.1 K/uL (ref 0.0–0.7)
Eosinophils Relative: 1.5 % (ref 0.0–5.0)
HCT: 38.6 % — ABNORMAL LOW (ref 39.0–52.0)
Hemoglobin: 12.6 g/dL — ABNORMAL LOW (ref 13.0–17.0)
Lymphocytes Relative: 16.1 % (ref 12.0–46.0)
Lymphs Abs: 1.2 K/uL (ref 0.7–4.0)
MCHC: 32.8 g/dL (ref 30.0–36.0)
MCV: 91 fl (ref 78.0–100.0)
Monocytes Absolute: 0.8 K/uL (ref 0.1–1.0)
Monocytes Relative: 10.5 % (ref 3.0–12.0)
Neutro Abs: 5.3 K/uL (ref 1.4–7.7)
Neutrophils Relative %: 70.9 % (ref 43.0–77.0)
Platelets: 361 K/uL (ref 150.0–400.0)
RBC: 4.24 Mil/uL (ref 4.22–5.81)
RDW: 15 % (ref 11.5–15.5)
WBC: 7.5 K/uL (ref 4.0–10.5)

## 2023-11-30 LAB — IGE: IgE (Immunoglobulin E), Serum: 40 kU/L (ref <=114)

## 2023-11-30 NOTE — Progress Notes (Signed)
 Called and spoke to pt - advised of results per Dr. Pleas. Pt verbalized understanding, NFN.

## 2023-12-24 ENCOUNTER — Encounter (HOSPITAL_BASED_OUTPATIENT_CLINIC_OR_DEPARTMENT_OTHER): Payer: Self-pay

## 2023-12-24 ENCOUNTER — Ambulatory Visit (INDEPENDENT_AMBULATORY_CARE_PROVIDER_SITE_OTHER)

## 2023-12-24 VITALS — BP 146/95 | HR 100 | Ht 68.0 in | Wt 274.0 lb

## 2023-12-24 DIAGNOSIS — Z23 Encounter for immunization: Secondary | ICD-10-CM | POA: Diagnosis not present

## 2023-12-24 DIAGNOSIS — Z85118 Personal history of other malignant neoplasm of bronchus and lung: Secondary | ICD-10-CM

## 2023-12-24 DIAGNOSIS — Z6841 Body Mass Index (BMI) 40.0 and over, adult: Secondary | ICD-10-CM

## 2023-12-24 DIAGNOSIS — J441 Chronic obstructive pulmonary disease with (acute) exacerbation: Secondary | ICD-10-CM

## 2023-12-24 DIAGNOSIS — J449 Chronic obstructive pulmonary disease, unspecified: Secondary | ICD-10-CM

## 2023-12-24 MED ORDER — AZITHROMYCIN 250 MG PO TABS: ORAL_TABLET | ORAL | 0 refills | Status: DC

## 2023-12-24 MED ORDER — PREDNISONE 10 MG PO TABS: ORAL_TABLET | ORAL | 0 refills | Status: AC

## 2023-12-24 MED ORDER — METHYLPREDNISOLONE SODIUM SUCC 125 MG IJ SOLR: 125.0000 mg | Freq: Once | INTRAMUSCULAR | Status: AC

## 2023-12-24 NOTE — Addendum Note (Signed)
 Addended by: Kalani Sthilaire O on: 12/24/2023 04:47 PM   Modules accepted: Orders

## 2023-12-24 NOTE — Patient Instructions (Signed)
 Take prednisone  taper and zpak as prescribed.  Follow up in 4 weeks; review information regarding Dupixent and Zepbound.
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
## 2024-02-04 ENCOUNTER — Encounter (HOSPITAL_BASED_OUTPATIENT_CLINIC_OR_DEPARTMENT_OTHER): Payer: Self-pay

## 2024-02-04 ENCOUNTER — Telehealth (HOSPITAL_BASED_OUTPATIENT_CLINIC_OR_DEPARTMENT_OTHER): Payer: Self-pay

## 2024-02-04 ENCOUNTER — Ambulatory Visit (INDEPENDENT_AMBULATORY_CARE_PROVIDER_SITE_OTHER)

## 2024-02-04 VITALS — BP 144/81 | HR 88 | Ht 68.0 in | Wt 267.0 lb

## 2024-02-04 DIAGNOSIS — J9611 Chronic respiratory failure with hypoxia: Secondary | ICD-10-CM

## 2024-02-04 DIAGNOSIS — Z6841 Body Mass Index (BMI) 40.0 and over, adult: Secondary | ICD-10-CM

## 2024-02-04 DIAGNOSIS — J449 Chronic obstructive pulmonary disease, unspecified: Secondary | ICD-10-CM

## 2024-02-04 MED ORDER — ROFLUMILAST 500 MCG PO TABS: 500.0000 ug | ORAL_TABLET | Freq: Every day | ORAL | 6 refills | Status: DC
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
## 2024-02-04 NOTE — Patient Instructions (Signed)
 Continue Asmanex , Stiolto, and Daliresp .  Continue Albuterol  every 4-6 hours as needed.  Continue supplemental oxygen and use BiPAP nightly; plan for follow up in 4-6 weeks to review compliance download.  Start Dupixent injections every 2 weeks.

## 2024-02-04 NOTE — Telephone Encounter (Signed)
 Rx sent to pharmacy, [facility]    Copied from CRM [id number]. Topic: Clinical - Prescription Issue >> Feb 01, 2024 10:35 AM Benton KIDD wrote: Reason for CRM: patient is calling because he see dr kassie [facility] got a prescription that needs a refill but  the [facility] will not refill until dr kassie dural prescription roflumilast  (DALIRESP ) 500 MCG TABS tablet  [facility] Fax number to send approval  [phone]

## 2024-02-05 ENCOUNTER — Telehealth: Payer: Self-pay

## 2024-02-05 DIAGNOSIS — J449 Chronic obstructive pulmonary disease, unspecified: Secondary | ICD-10-CM

## 2024-02-05 NOTE — Telephone Encounter (Signed)
 Received new start paperwork for Dupixent in [employer], opening benefits investigation in this thread.

## 2024-02-08 NOTE — Telephone Encounter (Signed)
 Patient is [facility] [employer] connected. Should be able to fill at [facility]) free of charge. Rx can be escribed and clinicals can be faxed.  They usually take 2 weeks to get medication to patient so may want to send rx to pharmacy upon scheduling new start so he won't have delay thereafter  Ida Uppal, PharmD, MPH, BCPS, CPP Clinical Pharmacist

## 2024-02-18 MED ORDER — DUPIXENT 300 MG/2ML ~~LOC~~ SOAJ: 300.0000 mg | SUBCUTANEOUS | 3 refills | Status: AC

## 2024-02-18 NOTE — Telephone Encounter (Signed)
 ATC patient at [phone], left HIPAA compliant VM with my office line to return call.  Patient returned my call - scheduled for Dupixent new start 02/25/24 at [facility]. [address]. [place], [place] [address].   Rx triaged to [facility]. He verbalizes understanding that if he receives medication from [facility] prior to our visit, he will place Dupixent in the refrigerator.   Clinicals (OV 02/04/24) faxed to [facility] at [phone] and to [facility] at [phone].

## 2024-02-25 ENCOUNTER — Other Ambulatory Visit

## 2024-02-25 ENCOUNTER — Other Ambulatory Visit (HOSPITAL_COMMUNITY): Payer: Self-pay

## 2024-02-25 DIAGNOSIS — Z7189 Other specified counseling: Secondary | ICD-10-CM | POA: Diagnosis not present

## 2024-02-25 DIAGNOSIS — J449 Chronic obstructive pulmonary disease, unspecified: Secondary | ICD-10-CM

## 2024-02-25 NOTE — Telephone Encounter (Signed)
 Copied from CRM [id number]. Topic: Clinical - Prescription Issue >> Feb 25, 2024  2:01 PM Vincent Sutton wrote: Reason for CRM: patient states [facility] does not carry Dupixent , he is requesting to have RX sent over to  [id number] - [place], [place] - [address] - [facility] - or if there is an alternate medication that can be prescribed that the [facility] pharmacy does carry.

## 2024-02-25 NOTE — Patient Instructions (Signed)
 Your next Dupixent  dose is due on 03/10/24 and we are sending you home with your next dose. Thereafter, inject Dupixent  every 14 days.   This medication is stored in the refrigerator. Once removed from the refrigerator, do NOT put back in the refrigerator. Please note, you should remove the drug from the refrigerator AT LEAST 45 minutes prior to injection to ensure the injection comes down to room temperature before injecting the medication. This medication is stable at room temperature for no longer than 14 days.   If you miss a dose, you should take it as soon as you remember it if it is within 7 days of the missed dose, then continue your usual schedule.   As a reminder, some side effects include injection site reaction, headache, and joint pain. If you are struggling with side effects, please call our office.   It can take several weeks to see the potential benefit of the medication. Please continue your other medications.   CONTINUE all other medications that are prescribed as part of your respiratory regimen.  Your prescription will be shipped from the [facility]. If the shipment does not arrive by the time you use your next dose on 03/10/24, [facility] to ask [facility] . We have sent the prescription.   You will need to be seen by your provider in 3 to 4 months to assess how Dupixent  is working for you. You have a follow-up appointment scheduled with Dr. Kassie on 03/18/24.   Stay up to date on all routine vaccines: influenza, pneumonia, COVID19, Shingles  How to manage an injection site reaction: Remember the 5 C's: COUNTER - leave on the counter at least 30 minutes but up to overnight to bring medication to room temperature. This may help prevent stinging COLD - place something cold (like an ice gel pack or cold water  bottle) on the injection site just before cleansing with alcohol. This may help reduce pain CLARITIN - use Claritin (generic name is loratadine) for the  first two weeks of treatment or the day of, the day before, and the day after injecting. This will help to minimize injection site reactions CORTISONE CREAM - apply if injection site is irritated and itching CALL ME - if injection site reaction is bigger than the size of your fist, looks infected, blisters, or if you develop hives

## 2024-02-25 NOTE — Telephone Encounter (Signed)
 Completed new start Dupixent  counseling and first dose using sample in clinic on 02/25/24.

## 2024-02-25 NOTE — Progress Notes (Signed)
 "  HPI Patient presents today to [facility] to see pharmacy team for Dupixent  new start.  Past medical history includes lung cancer, OSA, obesity (currently taking Zepbound) and COPD. Last seen by Candis Dandy, PA-C on 02/04/24, at which time plan was to start Dupixent . History of 5 dispenses of prednisone  this year per available prescription fill history.  He will receive Dupixent  shipment from the [facility]. He is referred to pharmacy team for Dupixent  education today and first dose using sample.   Respiratory Medications Current regimen:  - Stiolto 2.5-2.5 MCG/ACT (Inhale 2 puffs into the lungs daily) - Asmanex  220 mcg/act inhaler (Inhale 2 puffs into the lungs daily) - Daliresp  500mcg tablet (Take 1 tablet (500 mcg total) by mouth daily. Take on a full stomach) - Pulmicort  0.5 MG/2ML nebulizer (Take 2 mLs (0.5 mg total) by nebulization daily) - albuterol  108 mcg/act inhaler (Inhale 2 puffs into the lungs every 6 (six) hours as needed for wheezing or shortness of breath)  - albuterol  2.5mg /29mL 0.083% nebulizer soln (Take 3 mLs (2.5 mg total) by nebulization every 6 (six) hours as needed for wheezing or shortness of breath)  Patient reports no known adherence challenges  OBJECTIVE Allergies[1]  Outpatient Encounter Medications as of 02/25/2024  Medication Sig   albuterol  (PROVENTIL ) (2.5 MG/3ML) 0.083% nebulizer solution Take 3 mLs (2.5 mg total) by nebulization every 6 (six) hours as needed for wheezing or shortness of breath.   albuterol  (VENTOLIN  HFA) 108 (90 Base) MCG/ACT inhaler Inhale 2 puffs into the lungs every 6 (six) hours as needed for wheezing or shortness of breath.   azithromycin  (ZITHROMAX ) 250 MG tablet 2 tabs today, then 1 tab daily on days 2-5   budesonide  (PULMICORT ) 0.5 MG/2ML nebulizer solution Take 2 mLs (0.5 mg total) by nebulization daily.   cetirizine (ZYRTEC) 10 MG tablet Take 10 mg by mouth daily.   Cholecalciferol (VITAMIN D3) 5000 units TABS Take 2,000 Units by  mouth daily.    Dupilumab  (DUPIXENT ) 300 MG/2ML SOAJ Inject 300 mg into the skin every 14 (fourteen) days.   fluticasone  (FLONASE ) 50 MCG/ACT nasal spray Place 2 sprays into both nostrils daily.   methimazole (TAPAZOLE) 10 MG tablet Take 10 mg by mouth daily.   mometasone  (ASMANEX , 120 METERED DOSES,) 220 MCG/ACT inhaler Inhale 2 puffs into the lungs daily.   Na Sulfate-K Sulfate-Mg Sulfate concentrate (SUPREP) 17.5-3.13-1.6 GM/177ML SOLN Take 1 kit (354 mLs total) by mouth as directed.   omeprazole  (PRILOSEC) 20 MG capsule Take 1 capsule (20 mg total) by mouth 2 (two) times daily before a meal. Take before breakfast, take on an empty stomach   OXYGEN Inhale 3 L into the lungs. 24/7 when active   PARoxetine (PAXIL) 40 MG tablet Take 40 mg by mouth daily. Take 1/2 tablet   roflumilast  (DALIRESP ) 500 MCG TABS tablet Take 1 tablet (500 mcg total) by mouth daily. Take on a full stomach   Tiotropium Bromide -Olodaterol (STIOLTO RESPIMAT ) 2.5-2.5 MCG/ACT AERS Inhale 2 puffs into the lungs daily.   Facility-Administered Encounter Medications as of 02/25/2024  Medication   methylPREDNISolone  sodium succinate (SOLU-MEDROL ) 125 mg/2 mL injection 125 mg     Immunization History  Administered Date(s) Administered    sv, Bivalent, Protein Subunit Rsvpref,pf (Abrysvo) 12/26/2021   Dtap, Unspecified 05/26/1964, 06/23/1964, 07/21/1964, 09/21/1965   INFLUENZA, HIGH DOSE SEASONAL PF 11/21/2014   Influenza Split 02/24/2011, 11/03/2011   Influenza, Seasonal, Injecte, Preservative Fre 11/13/2022, 12/24/2023   Influenza,inj,Quad PF,6+ Mos 12/10/2012, 01/02/2014, 01/20/2016, 12/12/2016, 11/11/2018, 01/19/2020, 12/01/2021  Influenza-Unspecified 01/05/2015, 12/05/2017   Moderna Covid-19 Fall Seasonal Vaccine 16yrs & older 12/08/2021   Moderna Sars-Covid-2 Vaccination 04/25/2019, 05/23/2019   PNEUMOCOCCAL CONJUGATE-20 06/27/2022   Pneumococcal Conjugate-13 10/02/2014   Pneumococcal Polysaccharide-23 03/17/2016    Polio, Unspecified 06/16/1962, 05/26/1964   Smallpox 07/21/1964   Tdap 04/06/2015   Zoster Recombinant(Shingrix) 10/20/2019, 01/19/2020     PFTs    Latest Ref Rng & Units 08/03/2014   12:17 PM  PFT Results  FVC-Pre L 2.78   FVC-Predicted Pre % 62   FVC-Post L 2.79   FVC-Predicted Post % 62   Pre FEV1/FVC % % 52   Post FEV1/FCV % % 52   FEV1-Pre L 1.45   FEV1-Predicted Pre % 42   FEV1-Post L 1.44   DLCO uncorrected ml/min/mmHg 17.75   DLCO UNC% % 62   DLCO corrected ml/min/mmHg 18.29   DLCO COR %Predicted % 64   DLVA Predicted % 92   TLC L 6.23   TLC % Predicted % 97   RV % Predicted % 151      Eosinophils Most recent blood eosinophil count was 100 cells/microL taken on 11/29/23, previous labs several years ago show EOS 300 cells/microL   History of several courses of steroids this year.  Assessment   Biologics training for dupilumab  (Dupixent )  Goals of therapy: Mechanism: human monoclonal IgG4 antibody that inhibits interleukin-4 and interleukin-13 cytokine-induced responses, including release of proinflammatory cytokines, chemokines, and IgE Reviewed that Dupixent  is add-on medication and patient must continue maintenance inhaler regimen. Response to therapy: may take 4 months to determine efficacy. Discussed that patients generally feel improvement sooner than 4 months.  Side effects: injection site reaction (6-18%), antibody development (5-16%), ophthalmic conjunctivitis (2-16%), transient blood eosinophilia (1-2%)  Dose: 300mg  every 14 days (for COPD)  Administration/Storage:  Reviewed administration sites of thigh or abdomen (at least 2-3 inches away from abdomen). Reviewed the upper arm is only appropriate if caregiver is administering injection  Do not shake pen/syringe as this could lead to product foaming or precipitation. Do not use if solution is discolored or contains particulate matter or if window on prefilled pen is yellow (indicates pen has been  used).  Reviewed storage of medication in refrigerator. Reviewed that Dupixent  can be stored at room temperature in unopened carton for up to 14 days.  Access: Approval of Dupixent  through: insurance (Sent to [facility])  Patient self-administered Dupixent  300mg /29ml x 2 (total dose 600mg ) in right lower abdomen using sample  Dupixent  300mg /78mL autoinjector pen NDC: [phone] Lot: [id number] Expiration: [id number]  Patient monitored for 30 minutes for adverse reaction.  Patient tolerated well. Patient denies itchiness and irritation at injection.  Medication Reconciliation  A drug regimen assessment was performed, including review of allergies, interactions, disease-state management, dosing and immunization history. Medications were reviewed with the patient, including name, instructions, indication, goals of therapy, potential side effects, importance of adherence, and safe use.  Drug interaction(s): none noted  PLAN Continue Dupixent  300mg  every 14 days.  Next dose is due 03/10/24 and every 14 days thereafter. Rx sent to: [facility]. Sending patient home with remaining Dupixent  300mg /21mL autoinjector pen sample for next dose due 03/10/24. Ad[facility] if [facility] not [facility] of Dupixent  by the time he uses his next dose on 03/10/24. If needed, please contact our office to help facilitate if there are barriers to access of Dupixent .  Continue all remaining respiratory medications as prescribed by pulmonary team.  All questions encouraged and answered.  Instructed patient to reach out with any further questions or concerns.  Thank you for allowing pharmacy to participate in this patient's care.  This appointment required 45 minutes of patient care (this includes precharting, chart review, review of results, face-to-face care, etc.).  [name] Puls, PharmD, BCPS, CPP Clinical Pharmacist  [facility]  [facility]     [1]  Allergies Allergen Reactions   Silver Anaphylaxis    Patient reports skin redness, Patient reports skin redness   Taxol  [Paclitaxel ] Shortness Of Breath    Severe facial redness, nauseated.   Hydrocodone  Nausea Only   Biafine [Wound Dressings]     Patient reports skin redness   "

## 2024-02-25 NOTE — Telephone Encounter (Signed)
 ATC [facility] to clarify - I was told this issue is best handled by the pharmacy. ATC [facility] at [phone] ext [phone] (information provided to me by [facility]). Left detailed VM requesting return call to [phone] regarding obtaining access to Dupixent .

## 2024-02-26 NOTE — Telephone Encounter (Signed)
 Received call from [facility] -   Patient does not meet criteria for Dupixent  due to EOS are not 300 or greater.  Will likely need to pursue PAP

## 2024-03-11 ENCOUNTER — Telehealth: Payer: Self-pay | Admitting: *Deleted

## 2024-03-11 NOTE — Telephone Encounter (Unsigned)
 Copied from CRM [id number]. Topic: Clinical - Prescription Issue >> Mar 11, 2024 10:10 AM Rozanna MATSU wrote: Pt calling about his Dupixent , stated [facility]'t [facility] Dup[facility]ent  and wanted to know if there was alternative please send it to the pharmacy. Please reach back to pt stated he has been speaking with [name]. Stated he took his last shot yesterday.

## 2024-03-11 NOTE — Telephone Encounter (Signed)
 Copied from CRM [id number]. Topic: Clinical - Prescription Issue >> Feb 25, 2024  2:01 PM Vincent Sutton wrote: Reason for CRM: patient states [facility] does not carry Dupixent , he is requesting to have RX sent [facility] to  [id number] [place], [place] - [address] - pt wants to confirm that the [facility] will [facility] the Dupixent  medication [facility] - or if there is an alternate medication that can be prescribed that the [facility] pharmacy does carry. >> Mar 11, 2024 10:10 AM Rozanna G wrote: Pt calling about his Dupixent , stated the [facility] advised him they don't carry the Dupixent  and wanted to know if there was alternative please send it to the pharmacy. Please reach back to pt stated he has been speaking with [name]. Stated he took his last shot yesterday.

## 2024-03-11 NOTE — Telephone Encounter (Unsigned)
 Copied from CRM [id number]. Topic: Clinical - Prescription Issue >> Feb 25, 2024  2:01 PM Ismael A wrote: Reason for CRM: patient states [facility] does not carry Dupixent , he is requesting to have RX sent over to  [id number] [place], [place] - [address] - pt wants to confirm that the [facility] will pay for the Dupixent  medication [facility] - or if there is an alternate medication that can be prescribed that the [facility] pharmacy does carry. >> Mar 11, 2024 10:10 AM Rozanna G wrote: Pt calling about his Dupixent , stated the [facility] advised him they don't carry the Dupixent  and wanted to know if there was alternative please send it to the pharmacy. Please reach back to pt stated he has been speaking with [name]. Stated he took his last shot yesterday.

## 2024-03-11 NOTE — Telephone Encounter (Signed)
 Copied from CRM [id number]. Topic: Clinical - Prescription Issue >> Feb 25, 2024  2:01 PM Vincent Sutton wrote: Reason for CRM: patient states [facility] does not carry Dupixent , he is requesting to have RX sent [facility]  [id number] - [place], [place] - [address] - pt wants to confirm that the [facility] will pay for the Dupixent  medication [facility] - or if there is an alternate medication that can be prescribed that the [facility] pharmacy does carry. >> Mar 11, 2024 10:10 AM Rozanna G wrote: Pt calling about his Dupixent , stated the [facility] advised him they don't carry the Dupixent  and wanted to know if there was alternative please send it to the pharmacy. Please reach back to pt stated he has been speaking with [name]. Stated he took his last shot yesterday.     Hey pharmacy team, please advise.

## 2024-03-11 NOTE — Telephone Encounter (Signed)
 Copied from CRM [id number]. Topic: Clinical - Prescription Issue >> Feb 25, 2024  2:01 PM Vincent Sutton wrote: Reason for CRM: patient states [facility] does not carry Dupixent , he is requesting to have RX sent [facility]  [id number] [place], [place] - [address] - pt wants to confirm that the VA will pay for the Dupixent  medication [facility] - or if there is an alternate medication that can be prescribed that the [facility] pharmacy does carry. >> Mar 11, 2024 10:10 AM Vincent Sutton wrote: Pt calling about his Dupixent , stated the [facility] advised him they don't carry the Dupixent  and wanted to know if there was alternative please send it to the pharmacy. Please reach back to pt stated he has been speaking with [name]. Stated he took his last shot yesterday.

## 2024-03-11 NOTE — Telephone Encounter (Signed)
 Copied from CRM [id number]. Topic: Clinical - Prescription Issue >> Feb 25, 2024  2:01 PM Ismael A wrote: Reason for CRM: patient states [facility] does not carry Dupixent , he is requesting to have RX sent [facility]  [id number] [place], [place] - [address] - pt wants to confirm that the VA will pay for the Dupixent  medication before [facility] - or if there is an alternate medication that can be prescribed that the [facility] pharmacy does carry. >> Mar 11, 2024 10:10 AM Rozanna G wrote: Pt calling about his Dupixent , stated the [facility] advised him they don't carry the Dupixent  and wanted to know if there was alternative please send it to the pharmacy. Please reach back to pt stated he has been speaking with [name]. Stated he took his last shot yesterday.

## 2024-03-12 NOTE — Telephone Encounter (Signed)
 Routing to rx rheum pool as this is regarding a biologic med

## 2024-03-12 NOTE — Telephone Encounter (Signed)
 Completed Dupixent  [employer] patient assistance application with provider portion, patient portion, insurance information, and VA criteria for use that the patient does not meet, demonstrating insurance will not pay for Dupixent .   Fax # [phone] Phone # [phone]  Will await response.  LVM with patient at [phone] with detailed, HIPAA compliant VM explaining that VA will not pay for Dupixent . Provided my number to return call.

## 2024-03-12 NOTE — Telephone Encounter (Unsigned)
 Copied from CRM [id number]. Topic: Clinical - Prescription Issue >> Mar 12, 2024 11:36 AM Corean SAUNDERS wrote: Patient missed a call from nurse [facility] regarding Dupixent , patient is concerned about this medication being covered if not filled by the [facility], please call patient back to advise.   CAL stated nurse Powell was already on call when E2C2 agent attempted to transfer call.

## 2024-03-12 NOTE — Telephone Encounter (Signed)
 Please see Specialty Med Biv (Dupixent ) thread for updates.   Since it is a specialty medication, must fill with specialty pharmacy.   He does not meet VA criteria for use for Dupixent  - pursuing PAP.   LVM at [phone] with my number to return call.

## 2024-03-12 NOTE — Telephone Encounter (Signed)
 Copied from CRM [id number]. Topic: Clinical - Prescription Issue >> Feb 25, 2024  2:01 PM Vincent Sutton wrote: Reason for CRM: patient states [facility] does not carry Dupixent , he is requesting to have RX [facility]  [id number] [name] - [address] - pt wants to confirm that the VA will pay for the Dupixent  medication [facility] - or if there is an alternate medication that can be prescribed that the [facility] pharmacy does carry. >> Mar 11, 2024 10:10 AM Vincent Sutton wrote: Pt calling about his Dupixent , stated the [facility] advised him they don't carry the Dupixent  and wanted to know if there was alternative please send it to the pharmacy. Please reach back to pt stated he has been speaking with [name]. Stated he took his last shot yesterday.   -----------------------------------------------------------------------------------------------------------------------------------------  ATC patient x1.  [facility] [address] [facility] he [facility].  Requested that he return call.  Called [facility], Sutton message was sent to the pharmacy to return call to address whether VA will cover Dupixent  if sent to outside pharmacy.  Will await return call.

## 2024-03-12 NOTE — Telephone Encounter (Signed)
 Patient is returning a call to nurse Powell regarding Dupixent .  Please return his call at [phone] .  Thanks.

## 2024-03-12 NOTE — Telephone Encounter (Signed)
 Copied from CRM [id number]. Topic: Clinical - Prescription Issue >> Feb 25, 2024  2:01 PM Vincent Sutton wrote: Reason for CRM: patient states [facility] does not carry Dupixent , he is requesting to have RX [facility]  [id number] [place], [place] - [address] - pt wants to confirm that the VA will pay for the Dupixent  medication before [facility] - or if there is an alternate medication that can be prescribed that the [facility] pharmacy does carry. >> Mar 12, 2024 11:36 AM Corean SAUNDERS wrote: Patient missed Sutton call from nurse [facility] regarding Dupixent , patient is concerned about this medication being covered if not filled by the [facility], please call patient back to advise.   CAL stated nurse Powell was already on call when E2C2 agent attempted to transfer call.  >> Mar 11, 2024 10:10 AM Rozanna MATSU wrote: Pt calling about his Dupixent , stated the [facility] advised him they don't carry the Dupixent  and wanted to know if there was alternative please send it to the pharmacy. Please reach back to pt stated he has been speaking with [name]. Stated he took his last shot yesterday.   -------------------------------------------------------------------------------------------------------------------------------------------------  Clinical pharmacist is working on this and she will call him today.  Nothing further needed.

## 2024-03-14 NOTE — Telephone Encounter (Signed)
 See specialty med biv encounter for updates.

## 2024-03-18 ENCOUNTER — Telehealth: Payer: Self-pay | Admitting: Pharmacist

## 2024-03-18 ENCOUNTER — Ambulatory Visit (INDEPENDENT_AMBULATORY_CARE_PROVIDER_SITE_OTHER): Admitting: Pulmonary Disease

## 2024-03-18 ENCOUNTER — Encounter (HOSPITAL_BASED_OUTPATIENT_CLINIC_OR_DEPARTMENT_OTHER): Payer: Self-pay | Admitting: Pulmonary Disease

## 2024-03-18 VITALS — BP 164/84 | HR 90 | Ht 68.0 in | Wt 262.6 lb

## 2024-03-18 DIAGNOSIS — J9611 Chronic respiratory failure with hypoxia: Secondary | ICD-10-CM | POA: Diagnosis not present

## 2024-03-18 DIAGNOSIS — J449 Chronic obstructive pulmonary disease, unspecified: Secondary | ICD-10-CM | POA: Diagnosis not present

## 2024-03-18 DIAGNOSIS — G4733 Obstructive sleep apnea (adult) (pediatric): Secondary | ICD-10-CM | POA: Diagnosis not present

## 2024-03-18 MED ORDER — STIOLTO RESPIMAT 2.5-2.5 MCG/ACT IN AERS: 2.0000 | INHALATION_SPRAY | Freq: Every day | RESPIRATORY_TRACT | 11 refills | Status: AC

## 2024-03-18 MED ORDER — ROFLUMILAST 500 MCG PO TABS: 500.0000 ug | ORAL_TABLET | Freq: Every day | ORAL | 11 refills | Status: AC

## 2024-03-18 MED ORDER — BUDESONIDE 0.5 MG/2ML IN SUSP: 0.5000 mg | Freq: Every day | RESPIRATORY_TRACT | 1 refills | Status: AC

## 2024-03-18 MED ORDER — ASMANEX (120 METERED DOSES) 220 MCG/ACT IN AEPB: 2.0000 | INHALATION_SPRAY | Freq: Every day | RESPIRATORY_TRACT | 11 refills | Status: AC

## 2024-03-18 NOTE — Patient Instructions (Addendum)
--  CONTINUE Stiolto 2.5/2.5 TWO puffs ONCE a day --CONTINUE Asmanex  220 mcg TWO puffs daily --CONTINUE Pulmicort  nebulizer ONCE a day. When sick, ok to take twice a day --CONTINUE Daliresp  daily --Continue regular aerobic exercise --Pick up Dupixent  sample. Will await patient assistance approval --Obtain labs today: CBC with diff, IgE  --PFT prior to next visit

## 2024-03-18 NOTE — Telephone Encounter (Signed)
 Received fax from Dupixent  My[employer] with request for missing info - patient authorization and prescription.   Faxed missing info to [facility] at [phone]

## 2024-03-18 NOTE — Telephone Encounter (Signed)
 Medication Samples have been provided to the patient.  Drug name: Dupixent  300mg /41mL Qty: 1 pen (no ice packs) NDC: [address]-79 LOT: [id number] Exp.Date: 02/19/2025  Dosing instructions: Injcet 1 pen into skin on 03/24/2024. Store at room temperature for up to 14 days  The patient has been instructed regarding the correct time, dose, and frequency of taking this medication, including desired effects and most common side effects.   Sherry RAMAN Vincent Sutton 11:20 AM 03/18/2024
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
## 2024-03-20 ENCOUNTER — Ambulatory Visit (HOSPITAL_BASED_OUTPATIENT_CLINIC_OR_DEPARTMENT_OTHER): Payer: Self-pay | Admitting: Pulmonary Disease

## 2024-03-20 LAB — CBC WITH DIFFERENTIAL/PLATELET
Basophils Absolute: 0 10*3/uL (ref 0.0–0.2)
Basos: 1 %
EOS (ABSOLUTE): 0.1 10*3/uL (ref 0.0–0.4)
Eos: 2 %
Hematocrit: 39.3 % (ref 37.5–51.0)
Hemoglobin: 12.8 g/dL — ABNORMAL LOW (ref 13.0–17.7)
Immature Grans (Abs): 0 10*3/uL (ref 0.0–0.1)
Immature Granulocytes: 0 %
Lymphocytes Absolute: 1.2 10*3/uL (ref 0.7–3.1)
Lymphs: 16 %
MCH: 30 pg (ref 26.6–33.0)
MCHC: 32.6 g/dL (ref 31.5–35.7)
MCV: 92 fL (ref 79–97)
Monocytes Absolute: 0.8 10*3/uL (ref 0.1–0.9)
Monocytes: 10 %
Neutrophils Absolute: 5.4 10*3/uL (ref 1.4–7.0)
Neutrophils: 71 %
Platelets: 363 10*3/uL (ref 150–450)
RBC: 4.26 x10E6/uL (ref 4.14–5.80)
RDW: 13.1 % (ref 11.6–15.4)
WBC: 7.5 10*3/uL (ref 3.4–10.8)

## 2024-03-20 LAB — IGE: IgE (Immunoglobulin E), Serum: 23 [IU]/mL (ref 6–495)

## 2024-03-25 ENCOUNTER — Other Ambulatory Visit (HOSPITAL_BASED_OUTPATIENT_CLINIC_OR_DEPARTMENT_OTHER): Payer: Self-pay

## 2024-03-25 ENCOUNTER — Telehealth (HOSPITAL_BASED_OUTPATIENT_CLINIC_OR_DEPARTMENT_OTHER): Payer: Self-pay

## 2024-03-25 MED ORDER — ALBUTEROL SULFATE HFA 108 (90 BASE) MCG/ACT IN AERS: 2.0000 | INHALATION_SPRAY | Freq: Four times a day (QID) | RESPIRATORY_TRACT | 5 refills | Status: AC | PRN

## 2024-03-25 NOTE — Telephone Encounter (Signed)
 Rx sent to pharmacy   Copied from CRM [id number]. Topic: Clinical - Medication Refill >> Mar 25, 2024 10:53 AM LaVerne A wrote: Medication: albuterol  (VENTOLIN  HFA) 108 (90 Base) MCG/ACT inhaler  Has the patient contacted their pharmacy? Yes (Agent: If no, request that the patient contact the pharmacy for the refill. If patient does not wish to contact the pharmacy document the reason why and proceed with request.) (Agent: If yes, when and what did the pharmacy advise?)  This is the patient's preferred pharmacy:   [facility] - [place], [place] - [address] [place] [address] Phone: [phone] Fax: [phone]  Is this the correct pharmacy for this prescription? Yes If no, delete pharmacy and type the correct one.   Has the prescription been filled recently? Yes  Is the patient out of the medication? No  Has the patient been seen for an appointment in the last year OR does the patient have an upcoming appointment? Yes  Can we respond through MyChart? Yes  Agent: Please be advised that Rx refills may take up to 3 business days. We ask that you follow-up with your pharmacy.

## 2024-03-27 ENCOUNTER — Telehealth (HOSPITAL_BASED_OUTPATIENT_CLINIC_OR_DEPARTMENT_OTHER): Payer: Self-pay

## 2024-03-27 NOTE — Telephone Encounter (Unsigned)
 Copied from CRM [id number]. Topic: Clinical - Prescription Issue >> Mar 27, 2024  1:55 PM Corean SAUNDERS wrote: Reason for CRM: Patient is requesting to speak with Dr. Kassie, nurse, or caroline in the pharmacy as the Dupilumab  (DUPIXENT ) 300 MG/2ML SOAJ assistance program told him he was approved and then went back on the statement and said he was NOT approved. Patient wants to ensure there is nothing else that needs to be done or nothing falling between the cracks on the assistance program. Patient Is requesting a call back to ensure someone is helping assist in this matter.

## 2024-03-28 NOTE — Telephone Encounter (Signed)
 Called Dupixent  My Way. He is not enrolled because they are verifying his [facility] insurance information. They need to know the PBM and the ID number. From the [facility] referral authorization on file, I provided them information as follows on the [place] referral form:   Referral number:  [id number]  The representative I spoke with reports they have already reached out to the [place] to get the necessary information. She added the information I have to the case. DMW will reach out to office or patient with update.

## 2024-04-18 ENCOUNTER — Encounter (HOSPITAL_BASED_OUTPATIENT_CLINIC_OR_DEPARTMENT_OTHER)

## 2024-04-18 ENCOUNTER — Ambulatory Visit (HOSPITAL_BASED_OUTPATIENT_CLINIC_OR_DEPARTMENT_OTHER): Admitting: Pulmonary Disease
